# Patient Record
Sex: Female | Born: 1965 | Race: White | Hispanic: No | Marital: Single | Smoking: Current every day smoker
Health system: Southern US, Community
[De-identification: ages and names within clinical notes are randomized; demographics above are authoritative.]

## PROBLEM LIST (undated history)

## (undated) DIAGNOSIS — I700 Atherosclerosis of aorta: Secondary | ICD-10-CM

## (undated) DIAGNOSIS — I471 Supraventricular tachycardia, unspecified: Secondary | ICD-10-CM

## (undated) DIAGNOSIS — Z9289 Personal history of other medical treatment: Secondary | ICD-10-CM

## (undated) DIAGNOSIS — K219 Gastro-esophageal reflux disease without esophagitis: Secondary | ICD-10-CM

## (undated) DIAGNOSIS — R079 Chest pain, unspecified: Secondary | ICD-10-CM

## (undated) DIAGNOSIS — J449 Chronic obstructive pulmonary disease, unspecified: Secondary | ICD-10-CM

## (undated) DIAGNOSIS — R0600 Dyspnea, unspecified: Secondary | ICD-10-CM

## (undated) DIAGNOSIS — T8859XA Other complications of anesthesia, initial encounter: Secondary | ICD-10-CM

## (undated) DIAGNOSIS — I10 Essential (primary) hypertension: Secondary | ICD-10-CM

## (undated) DIAGNOSIS — D649 Anemia, unspecified: Secondary | ICD-10-CM

## (undated) DIAGNOSIS — Z9889 Other specified postprocedural states: Secondary | ICD-10-CM

## (undated) DIAGNOSIS — F32A Depression, unspecified: Secondary | ICD-10-CM

## (undated) DIAGNOSIS — R112 Nausea with vomiting, unspecified: Secondary | ICD-10-CM

## (undated) DIAGNOSIS — E785 Hyperlipidemia, unspecified: Secondary | ICD-10-CM

## (undated) DIAGNOSIS — Z720 Tobacco use: Secondary | ICD-10-CM

## (undated) DIAGNOSIS — F419 Anxiety disorder, unspecified: Secondary | ICD-10-CM

## (undated) HISTORY — DX: Personal history of other medical treatment: Z92.89

## (undated) HISTORY — DX: Essential (primary) hypertension: I10

## (undated) HISTORY — DX: Hyperlipidemia, unspecified: E78.5

## (undated) HISTORY — PX: MOUTH SURGERY: SHX715

## (undated) HISTORY — DX: Supraventricular tachycardia, unspecified: I47.10

## (undated) HISTORY — DX: Supraventricular tachycardia: I47.1

## (undated) HISTORY — DX: Depression, unspecified: F32.A

## (undated) HISTORY — DX: Chest pain, unspecified: R07.9

## (undated) HISTORY — DX: Atherosclerosis of aorta: I70.0

## (undated) HISTORY — DX: Anxiety disorder, unspecified: F41.9

## (undated) HISTORY — DX: Chronic obstructive pulmonary disease, unspecified: J44.9

## (undated) HISTORY — DX: Tobacco use: Z72.0

## (undated) HISTORY — PX: CHOLECYSTECTOMY: SHX55

---

## 2006-05-16 ENCOUNTER — Emergency Department: Payer: Self-pay | Admitting: Emergency Medicine

## 2008-04-17 ENCOUNTER — Emergency Department: Payer: Self-pay | Admitting: Emergency Medicine

## 2009-01-07 ENCOUNTER — Emergency Department: Payer: Self-pay | Admitting: Emergency Medicine

## 2012-03-16 ENCOUNTER — Emergency Department: Payer: Self-pay

## 2013-05-01 DIAGNOSIS — R5383 Other fatigue: Secondary | ICD-10-CM | POA: Insufficient documentation

## 2013-05-01 DIAGNOSIS — G4700 Insomnia, unspecified: Secondary | ICD-10-CM | POA: Insufficient documentation

## 2013-05-11 ENCOUNTER — Ambulatory Visit: Payer: Self-pay

## 2013-07-27 DIAGNOSIS — J069 Acute upper respiratory infection, unspecified: Secondary | ICD-10-CM | POA: Insufficient documentation

## 2013-07-27 DIAGNOSIS — F419 Anxiety disorder, unspecified: Secondary | ICD-10-CM | POA: Insufficient documentation

## 2013-07-27 DIAGNOSIS — F32A Depression, unspecified: Secondary | ICD-10-CM | POA: Insufficient documentation

## 2013-07-31 DIAGNOSIS — R928 Other abnormal and inconclusive findings on diagnostic imaging of breast: Secondary | ICD-10-CM | POA: Insufficient documentation

## 2014-09-27 ENCOUNTER — Encounter: Payer: Self-pay | Admitting: Emergency Medicine

## 2014-09-27 ENCOUNTER — Emergency Department
Admission: EM | Admit: 2014-09-27 | Discharge: 2014-09-27 | Disposition: A | Discharge status: Home / Self Care | Payer: Medicaid Other | Attending: Emergency Medicine | Admitting: Emergency Medicine

## 2014-09-27 DIAGNOSIS — Z3202 Encounter for pregnancy test, result negative: Secondary | ICD-10-CM | POA: Diagnosis not present

## 2014-09-27 DIAGNOSIS — R000 Tachycardia, unspecified: Secondary | ICD-10-CM | POA: Diagnosis present

## 2014-09-27 DIAGNOSIS — Z720 Tobacco use: Secondary | ICD-10-CM | POA: Insufficient documentation

## 2014-09-27 DIAGNOSIS — I471 Supraventricular tachycardia: Secondary | ICD-10-CM | POA: Diagnosis not present

## 2014-09-27 HISTORY — DX: Supraventricular tachycardia: I47.1

## 2014-09-27 LAB — URINALYSIS COMPLETE WITH MICROSCOPIC (ARMC ONLY)
Bilirubin Urine: NEGATIVE
Glucose, UA: NEGATIVE mg/dL
Hgb urine dipstick: NEGATIVE
Ketones, ur: NEGATIVE mg/dL
Leukocytes, UA: NEGATIVE
Nitrite: NEGATIVE
PH: 6 (ref 5.0–8.0)
PROTEIN: NEGATIVE mg/dL
Specific Gravity, Urine: 1.008 (ref 1.005–1.030)

## 2014-09-27 LAB — TROPONIN I

## 2014-09-27 LAB — URINE DRUG SCREEN, QUALITATIVE (ARMC ONLY)
Amphetamines, Ur Screen: NOT DETECTED
BARBITURATES, UR SCREEN: NOT DETECTED
BENZODIAZEPINE, UR SCRN: NOT DETECTED
Cannabinoid 50 Ng, Ur ~~LOC~~: NOT DETECTED
Cocaine Metabolite,Ur ~~LOC~~: NOT DETECTED
MDMA (Ecstasy)Ur Screen: NOT DETECTED
METHADONE SCREEN, URINE: NOT DETECTED
Opiate, Ur Screen: NOT DETECTED
Phencyclidine (PCP) Ur S: NOT DETECTED
TRICYCLIC, UR SCREEN: NOT DETECTED

## 2014-09-27 LAB — CBC WITH DIFFERENTIAL/PLATELET
BASOS ABS: 0.1 10*3/uL (ref 0–0.1)
Basophils Relative: 1 %
EOS ABS: 0.1 10*3/uL (ref 0–0.7)
EOS PCT: 1 %
HCT: 41.4 % (ref 35.0–47.0)
Hemoglobin: 14.4 g/dL (ref 12.0–16.0)
LYMPHS PCT: 20 %
Lymphs Abs: 1.9 10*3/uL (ref 1.0–3.6)
MCH: 31.3 pg (ref 26.0–34.0)
MCHC: 34.8 g/dL (ref 32.0–36.0)
MCV: 89.9 fL (ref 80.0–100.0)
MONO ABS: 0.6 10*3/uL (ref 0.2–0.9)
Monocytes Relative: 6 %
Neutro Abs: 6.9 10*3/uL — ABNORMAL HIGH (ref 1.4–6.5)
Neutrophils Relative %: 72 %
PLATELETS: 203 10*3/uL (ref 150–440)
RBC: 4.6 MIL/uL (ref 3.80–5.20)
RDW: 12.3 % (ref 11.5–14.5)
WBC: 9.5 10*3/uL (ref 3.6–11.0)

## 2014-09-27 LAB — BASIC METABOLIC PANEL
Anion gap: 8 (ref 5–15)
BUN: 14 mg/dL (ref 6–20)
CALCIUM: 9.2 mg/dL (ref 8.9–10.3)
CO2: 23 mmol/L (ref 22–32)
CREATININE: 0.77 mg/dL (ref 0.44–1.00)
Chloride: 108 mmol/L (ref 101–111)
GFR calc Af Amer: >60 mL/min (ref >60)
GLUCOSE: 105 mg/dL — AB (ref 65–99)
POTASSIUM: 3.8 mmol/L (ref 3.5–5.1)
SODIUM: 139 mmol/L (ref 135–145)

## 2014-09-27 LAB — PREGNANCY, URINE: Preg Test, Ur: NEGATIVE

## 2014-09-27 LAB — PHOSPHORUS: Phosphorus: 3.7 mg/dL (ref 2.5–4.6)

## 2014-09-27 LAB — MAGNESIUM: MAGNESIUM: 1.9 mg/dL (ref 1.7–2.4)

## 2014-09-27 MED ORDER — LORAZEPAM 0.5 MG PO TABS
ORAL_TABLET | ORAL | Status: AC
  Administered 2014-09-27: 0.5 mg via ORAL
  Filled 2014-09-27: qty 1

## 2014-09-27 MED ORDER — LORAZEPAM 0.5 MG PO TABS
0.5000 mg | ORAL_TABLET | Freq: Once | ORAL | Status: AC
  Administered 2014-09-27: 0.5 mg via ORAL

## 2014-09-27 NOTE — Discharge Instructions (Signed)
Supraventricular Tachycardia °Supraventricular tachycardia (SVT) is an abnormal heart rhythm (arrhythmia) that causes the heart to beat very fast (tachycardia). This kind of fast heartbeat originates in the upper chambers of the heart (atria). SVT can cause the heart to beat greater than 100 beats per minute. SVT can have a rapid burst of heartbeats. This can start and stop suddenly without warning and is called nonsustained. SVT can also be sustained, in which the heart beats at a continuous fast rate.  °CAUSES  °There can be different causes of SVT. Some of these include: °· Heart valve problems such as mitral valve prolapse. °· An enlarged heart (hypertrophic cardiomyopathy). °· Congenital heart problems. °· Heart inflammation (pericarditis). °· Hyperthyroidism. °· Low potassium or magnesium levels. °· Caffeine. °· Drug use such as cocaine, methamphetamines, or stimulants. °· Some over-the-counter medicines such as: °¨ Decongestants. °¨ Diet medicines. °¨ Herbal medicines. °SYMPTOMS  °Symptoms of SVT can vary. Symptoms depend on whether the SVT is sustained or nonsustained. You may experience: °· No symptoms (asymptomatic). °· An awareness of your heart beating rapidly (palpitations). °· Shortness of breath. °· Chest pain or pressure. °If your blood pressure drops because of the SVT, you may experience: °· Fainting or near fainting. °· Weakness. °· Dizziness. °DIAGNOSIS  °Different tests can be performed to diagnose SVT, such as: °· An electrocardiogram (EKG). This is a painless test that records the electrical activity of your heart. °· Holter monitor. This is a 24 hour recording of your heart rhythm. You will be given a diary. Write down all symptoms that you have and what you were doing at the time you experienced symptoms. °· Arrhythmia monitor. This is a small device that your wear for several weeks. It records the heart rhythm when you have symptoms. °· Echocardiogram. This is an imaging test to help detect  abnormal heart structure such as congenital abnormalities, heart valve problems, or heart enlargement. °· Stress test. This test can help determine if the SVT is related to exercise. °· Electrophysiology study (EPS). This is a procedure that evaluates your heart's electrical system and can help your caregiver find the cause of your SVT. °TREATMENT  °Treatment of SVT depends on the symptoms, how often it recurs, and whether there are any underlying heart problems.  °· If symptoms are rare and no other cardiac disease is present, no treatment may be needed. °· Blood work may be done to check potassium, magnesium, and thyroid hormone levels to see if they are abnormal. If these levels are abnormal, treatment to correct the problems will occur. °Medicines °Your caregiver may use oral medicines to treat SVT. These medicines are given for long-term control of SVT. Medicines may be used alone or in combination with other treatments. These medicines work to slow nerve impulses in the heart muscle. These medicines can also be used to treat high blood pressure. Some of these medicines may include: °· Calcium channel blockers. °· Beta blockers. °· Digoxin. °Nonsurgical procedures °Nonsurgical techniques may be used if oral medicines do not work. Some examples include: °· Cardioversion. This technique uses either drugs or an electrical shock to restore a normal heart rhythm. °¨ Cardioversion drugs may be given through an intravenous (IV) line to help "reset" the heart rhythm. °¨ In electrical cardioversion, the caregiver shocks your heart to stop its beat for a split second. This helps to reset the heart to a normal rhythm. °· Ablation. This procedure is done under mild sedation. High frequency radio wave energy is used to   destroy the area of heart tissue responsible for the SVT. °HOME CARE INSTRUCTIONS  °· Do not smoke. °· Only take medicines prescribed by your caregiver. Check with your caregiver before using over-the-counter  medicines. °· Check with your caregiver about how much alcohol and caffeine (coffee, tea, colas, or chocolate) you may have. °· It is very important to keep all follow-up referrals and appointments in order to properly manage this problem. °SEEK IMMEDIATE MEDICAL CARE IF: °· You have dizziness. °· You faint or nearly faint. °· You have shortness of breath. °· You have chest pain or pressure. °· You have sudden nausea or vomiting. °· You have profuse sweating. °· You are concerned about how long your symptoms last. °· You are concerned about the frequency of your SVT episodes. °If you have the above symptoms, call your local emergency services (911 in [place] immediately. Do not drive yourself to the hospital. °MAKE SURE YOU:  °· Understand these instructions. °· Will watch your condition. °· Will get help right away if you are not doing well or get worse. °Document Released: 01/05/2005 Document Revised: 03/30/2011 Document Reviewed: 04/19/2008 °Exit[employer]® Patient Information ©2015 [employer]. This information is not intended to replace advice given to you by your health care provider. Make sure you discuss any questions you have with your health care provider. ° °

## 2014-09-27 NOTE — ED Notes (Signed)
Ems pt from work , SVT of a rate of HR 228, converted while ems attempted to start IV , pt currently as Normal vitals , complaining of a chest tightness , and dizziness.

## 2014-09-27 NOTE — ED Provider Notes (Signed)
[facility] Emergency Department Provider Note  ____________________________________________  Time seen: 9:00 AM  I have reviewed the triage vital signs and the nursing notes.   HISTORY  Chief Complaint Tachycardia    [name] is a 49 y.o. female with a history of SVT in the past who arrives by EMS from work due to palpitations and rapid heartbeat. EMS report that her heart rate was greater than 220, and when they were starting an IV the patient spontaneously converted to a sinus rhythm on their monitor with a normalized heart rate. The patient reports that she had no symptoms prior to this happening and it just happened out of the blue, and afterward she does have some very mild central chest tightness and anxiety. Denies any shortness of breath. No recent travel trauma hospitalization or surgery. No cocaine or other drug use. No stimulant use. She does report a remote history of cocaine use  Drinks one cup of coffee every morning. Smokes one and a half packs per day which is her usual. No exertional symptoms. No fevers chills or recent illness.     Past Medical History  Diagnosis Date  . SVT (supraventricular tachycardia)      There are no active problems to display for this patient.    Past Surgical History  Procedure Laterality Date  . Cesarean section       No current outpatient prescriptions on file. Ativan, not filled due to expense  Allergies Codeine   No family history on file.  Social History Social History  Substance Use Topics  . Smoking status: Current Every Day Smoker  . Smokeless tobacco: None  . Alcohol Use: None    Review of Systems  Constitutional:   No fever or chills. No weight changes Eyes:   No blurry vision or double vision.  ENT:   No sore throat. Cardiovascular:   Mild chest tightness Respiratory:   No dyspnea or cough. Gastrointestinal:   Negative for abdominal pain, vomiting and diarrhea.  No  BRBPR or melena. Genitourinary:   Negative for dysuria, urinary retention, bloody urine, or difficulty urinating. Musculoskeletal:   Negative for back pain. No joint swelling or pain. Skin:   Negative for rash. Neurological:   Negative for headaches, focal weakness or numbness. Psychiatric:  No anxiety or depression.   Endocrine:  No hot/cold intolerance, changes in energy, or sleep difficulty.  10-point ROS otherwise negative.  ____________________________________________   PHYSICAL EXAM:  VITAL SIGNS: ED Triage Vitals  Enc Vitals Group     BP 09/27/14 0906 130/88 mmHg     Pulse Rate 09/27/14 0905 98     Resp 09/27/14 0905 20     Temp 09/27/14 0911 98.1 F (36.7 C)     Temp src --      SpO2 09/27/14 0906 98 %     Weight 09/27/14 0905 169 lb (76.658 kg)     Height 09/27/14 0905  (1.575 m)     Head Cir --      Peak Flow --      Pain Score 09/27/14 0905 2     Pain Loc --      Pain Edu? --      Excl. in GC? --      Constitutional:   Alert and oriented. Well appearing and in no distress. Calm and comfortable Eyes:   No scleral icterus. No conjunctival pallor. PERRL. EOMI ENT   Head:   Normocephalic and atraumatic.  Nose:   No congestion/rhinnorhea. No septal hematoma   Mouth/Throat:   MMM, no pharyngeal erythema. No peritonsillar mass. No uvula shift.   Neck:   No stridor. No SubQ emphysema. No meningismus. Hematological/Lymphatic/Immunilogical:   No cervical lymphadenopathy. Cardiovascular:   RRR. Normal and symmetric distal pulses are present in all extremities. No murmurs, rubs, or gallops. Respiratory:   Normal respiratory effort without tachypnea nor retractions. Breath sounds are clear and equal bilaterally. No wheezes/rales/rhonchi. Gastrointestinal:   Soft and nontender. No distention. There is no CVA tenderness.  No rebound, rigidity, or guarding. Genitourinary:   deferred Musculoskeletal:   Nontender with normal range of motion in all  extremities. No joint effusions.  No lower extremity tenderness.  No edema. Neurologic:   Normal speech and language.  CN 2-10 normal. Motor grossly intact. No pronator drift.  Normal gait. No gross focal neurologic deficits are appreciated.  Skin:    Skin is warm, dry and intact. No rash noted.  No petechiae, purpura, or bullae. Psychiatric:   Mood and affect are normal. Speech and behavior are normal. Patient exhibits appropriate insight and judgment.  ____________________________________________    LABS (pertinent positives/negatives) (all labs ordered are listed, but only abnormal results are displayed) Labs Reviewed  BASIC METABOLIC PANEL - Abnormal; Notable for the following:    Glucose, Bld 105 (*)    All other components within normal limits  CBC WITH DIFFERENTIAL/PLATELET - Abnormal; Notable for the following:    Neutro Abs 6.9 (*)    All other components within normal limits  URINALYSIS COMPLETEWITH MICROSCOPIC ([facility] ONLY) - Abnormal; Notable for the following:    Color, Urine YELLOW (*)    APPearance CLEAR (*)    Bacteria, UA RARE (*)    Squamous Epithelial / LPF 0-5 (*)    All other components within normal limits  MAGNESIUM  PHOSPHORUS  PREGNANCY, URINE  URINE DRUG SCREEN, QUALITATIVE ([facility] ONLY)  TROPONIN I   ____________________________________________   EKG  Interpreted by me Sinus rhythm rate of 94, normal axis intervals QRS ST segments and T waves.  ____________________________________________    RADIOLOGY    ____________________________________________   PROCEDURES   ____________________________________________   INITIAL IMPRESSION / ASSESSMENT AND PLAN / ED COURSE  Pertinent labs & imaging results that were available during my care of the patient were reviewed by me and considered in my medical decision making (see chart for details).  Patient presents with recurrent episode of SVT that is spontaneously resolved. We'll check labs,  and if unremarkable patient is stable for discharge home. Does not need rate control agents at this time. Low suspicion for ACS PE TAD pneumothorax carditis mediastinitis pneumonia or sepsis.     ____________________________________________   FINAL CLINICAL IMPRESSION(S) / ED DIAGNOSES  Final diagnoses:  SVT (supraventricular tachycardia)      Sharman Cheek, MD 09/27/14 1052

## 2014-11-08 ENCOUNTER — Ambulatory Visit: Payer: Medicaid Other | Admitting: Cardiovascular Disease

## 2014-11-12 ENCOUNTER — Ambulatory Visit: Payer: Medicaid Other | Admitting: Cardiovascular Disease

## 2015-01-10 ENCOUNTER — Ambulatory Visit: Payer: Medicaid Other | Admitting: Cardiovascular Disease

## 2015-01-10 ENCOUNTER — Encounter: Payer: Self-pay | Admitting: *Deleted

## 2015-01-10 DIAGNOSIS — R0989 Other specified symptoms and signs involving the circulatory and respiratory systems: Secondary | ICD-10-CM

## 2015-02-01 ENCOUNTER — Ambulatory Visit: Payer: Medicaid Other | Admitting: Cardiovascular Disease

## 2015-02-10 ENCOUNTER — Emergency Department
Admission: EM | Admit: 2015-02-10 | Discharge: 2015-02-10 | Disposition: A | Discharge status: Home / Self Care | Payer: Medicaid Other | Attending: Student | Admitting: Student

## 2015-02-10 ENCOUNTER — Encounter: Payer: Self-pay | Admitting: Emergency Medicine

## 2015-02-10 ENCOUNTER — Emergency Department: Payer: Medicaid Other

## 2015-02-10 DIAGNOSIS — I471 Supraventricular tachycardia: Secondary | ICD-10-CM | POA: Diagnosis not present

## 2015-02-10 DIAGNOSIS — Z79899 Other long term (current) drug therapy: Secondary | ICD-10-CM | POA: Insufficient documentation

## 2015-02-10 DIAGNOSIS — F17200 Nicotine dependence, unspecified, uncomplicated: Secondary | ICD-10-CM | POA: Insufficient documentation

## 2015-02-10 DIAGNOSIS — R000 Tachycardia, unspecified: Secondary | ICD-10-CM | POA: Diagnosis present

## 2015-02-10 LAB — CBC WITH DIFFERENTIAL/PLATELET
BASOS ABS: 0 10*3/uL (ref 0–0.1)
BASOS PCT: 1 %
Eosinophils Absolute: 0.1 10*3/uL (ref 0–0.7)
Eosinophils Relative: 2 %
HEMATOCRIT: 40.8 % (ref 35.0–47.0)
HEMOGLOBIN: 14.2 g/dL (ref 12.0–16.0)
LYMPHS PCT: 26 %
Lymphs Abs: 1.9 10*3/uL (ref 1.0–3.6)
MCH: 31.2 pg (ref 26.0–34.0)
MCHC: 34.7 g/dL (ref 32.0–36.0)
MCV: 89.8 fL (ref 80.0–100.0)
MONOS PCT: 8 %
Monocytes Absolute: 0.6 10*3/uL (ref 0.2–0.9)
NEUTROS ABS: 4.6 10*3/uL (ref 1.4–6.5)
NEUTROS PCT: 63 %
Platelets: 213 10*3/uL (ref 150–440)
RBC: 4.54 MIL/uL (ref 3.80–5.20)
RDW: 12.5 % (ref 11.5–14.5)
WBC: 7.3 10*3/uL (ref 3.6–11.0)

## 2015-02-10 LAB — COMPREHENSIVE METABOLIC PANEL
ALT: 14 U/L (ref 14–54)
AST: 14 U/L — ABNORMAL LOW (ref 15–41)
Albumin: 3.8 g/dL (ref 3.5–5.0)
Alkaline Phosphatase: 51 U/L (ref 38–126)
Anion gap: 6 (ref 5–15)
BUN: 11 mg/dL (ref 6–20)
CO2: 26 mmol/L (ref 22–32)
Calcium: 8.8 mg/dL — ABNORMAL LOW (ref 8.9–10.3)
Chloride: 110 mmol/L (ref 101–111)
Creatinine, Ser: 0.66 mg/dL (ref 0.44–1.00)
GFR calc Af Amer: >60 mL/min (ref >60)
GFR calc non Af Amer: >60 mL/min (ref >60)
Glucose, Bld: 107 mg/dL — ABNORMAL HIGH (ref 65–99)
Potassium: 3.8 mmol/L (ref 3.5–5.1)
Sodium: 142 mmol/L (ref 135–145)
Total Bilirubin: 0.6 mg/dL (ref 0.3–1.2)
Total Protein: 8.1 g/dL (ref 6.5–8.1)

## 2015-02-10 LAB — TSH: TSH: 2.152 u[IU]/mL (ref 0.350–4.500)

## 2015-02-10 LAB — TROPONIN I: Troponin I: <0.03 ng/mL (ref <0.031)

## 2015-02-10 NOTE — ED Notes (Signed)
Patient brought in by acems from home for c/o SVT, patient states that she got up to go to the bathroom and started to feel dizzy and felt like her heart was racing. When EMS arrived patients rate was 180-200 and she was found to be in SVT on monitor. Patient was given 6 mg of adenosine and converted to sinus tach. Patient has had 2 episodes of SVT in the past.

## 2015-02-10 NOTE — ED Provider Notes (Signed)
[facility] Emergency Department Provider Note  ____________________________________________  Time seen: Approximately 12:04 PM  I have reviewed the triage vital signs and the nursing notes.   HISTORY  Chief Complaint Tachycardia    [name] is a 50 y.o. female with history of SVT and anxiety who presents for lightheadedness in the setting of SVT, sudden onset, initially severe, now resolved after EMS gave her adenosine, no modifying factors. The patient reports that this is the third time that she has had a run of SVT. Usually SVT terminates with vagal maneuvers but today, unresponsive to vagal maneuvers. She was having some mild chest discomfort when she had this lightheadedness but this has resolved. No shortness of breath. She denies palpitations. No vomiting, diarrhea, fevers or chills. Really she feels well.   Past Medical History  Diagnosis Date  . SVT (supraventricular tachycardia) (HCC)     There are no active problems to display for this patient.   Past Surgical History  Procedure Laterality Date  . Cesarean section      Current Outpatient Rx  Name  Route  Sig  Dispense  Refill  . LORazepam (ATIVAN) 0.5 MG tablet   Oral   Take 0.5 mg by mouth every 8 (eight) hours as needed for anxiety.          Marland Kitchen buPROPion (WELLBUTRIN XL) 300 MG 24 hr tablet   Oral   Take 300 mg by mouth daily. Reported on 02/10/2015           Allergies Codeine  No family history on file.  Social History Social History  Substance Use Topics  . Smoking status: Current Every Day Smoker  . Smokeless tobacco: None  . Alcohol Use: Yes    Review of Systems Constitutional: No fever/chills Eyes: No visual changes. ENT: No sore throat. Cardiovascular: + chest pain. Respiratory: Denies shortness of breath. Gastrointestinal: No abdominal pain.  No nausea, no vomiting.  No diarrhea.  No constipation. Genitourinary: Negative for  dysuria. Musculoskeletal: Negative for back pain. Skin: Negative for rash. Neurological: Negative for headaches, focal weakness or numbness.  10-point ROS otherwise negative.  ____________________________________________   PHYSICAL EXAM:  VITAL SIGNS: ED Triage Vitals  Enc Vitals Group     BP 02/10/15 1124 115/82 mmHg     Pulse Rate 02/10/15 1124 89     Resp 02/10/15 1124 14     Temp 02/10/15 1124 98.1 F (36.7 C)     Temp Source 02/10/15 1124 Oral     SpO2 02/10/15 1124 96 %     Weight 02/10/15 1124 183 lb (83.008 kg)     Height 02/10/15 1124  (1.575 m)     Head Cir --      Peak Flow --      Pain Score 02/10/15 1125 0     Pain Loc --      Pain Edu? --      Excl. in GC? --     Constitutional: Alert and oriented. Well appearing and in no acute distress. Eyes: Conjunctivae are normal. PERRL. EOMI.  Head: Atraumatic. Nose: No congestion/rhinnorhea. Mouth/Throat: Mucous membranes are moist.  Oropharynx non-erythematous. Neck: No stridor.   Cardiovascular: Normal rate, regular rhythm. Grossly normal heart sounds.  Good peripheral circulation. Respiratory: Normal respiratory effort.  No retractions. Lungs CTAB. Gastrointestinal: Soft and nontender. No distention. No CVA tenderness. Genitourinary: deferred Musculoskeletal: No lower extremity tenderness nor edema.  No joint effusions. Neurologic:  Normal speech and language. No  gross focal neurologic deficits are appreciated. No gait instability. Skin:  Skin is warm, dry and intact. No rash noted. Psychiatric: Mood and affect are normal. Speech and behavior are normal.  ____________________________________________   LABS (all labs ordered are listed, but only abnormal results are displayed)  Labs Reviewed  COMPREHENSIVE METABOLIC PANEL - Abnormal; Notable for the following:    Glucose, Bld 107 (*)    Calcium 8.8 (*)    AST 14 (*)    All other components within normal limits  TSH  CBC WITH DIFFERENTIAL/PLATELET   TROPONIN I  T4   ____________________________________________  EKG  ED ECG REPORT I, Gayla Doss, the attending physician, personally viewed and interpreted this ECG.   Date: 02/10/2015  EKG Time: 11:18  Rate: 92  Rhythm: normal EKG, normal sinus rhythm  Axis: normal  Intervals:none  ST&T Change: No acute ST elevation.  ____________________________________________  RADIOLOGY  CXR IMPRESSION: No acute cardiopulmonary abnormality.  Mild increased interstitial markings suspected due to smoking.   ____________________________________________   PROCEDURES  Procedure(s) performed: None  Critical Care performed: No  ____________________________________________   INITIAL IMPRESSION / ASSESSMENT AND PLAN / ED COURSE  Pertinent labs & imaging results that were available during my care of the patient were reviewed by me and considered in my medical decision making (see chart for details).  [name] is a 50 y.o. female with history of SVT and anxiety who presents for lightheadedness in the setting of SVT with rate of 180-200 which converted to sinus tachycardia after 6 mg of adenosine was given by EMS. On exam, she is very well-appearing in no acute distress. Vital signs stable, she is afebrile. Currently chest pain-free. EKG shows sinus rhythm. Plan for screening labs, brief observation in the emergency department as well as chest x-ray.   ----------------------------------------- 2:22 PM on 02/10/2015 ----------------------------------------- Patient observed for 3 hours in the emergency department without any recurrence of SVT. Labs reviewed. Normal CBC, CMP and troponin. Normal TSH. CXR shows no acute abnormality. Patient continues to feel well. We discussed return precautions, need for close follow-up with Dr. Kirke Corin and she is comfortable with the discharge plan. DC home.  ____________________________________________   FINAL CLINICAL IMPRESSION(S) / ED  DIAGNOSES  Final diagnoses:  SVT (supraventricular tachycardia) (HCC)      Gayla Doss, MD 02/10/15 1425

## 2015-02-20 ENCOUNTER — Emergency Department
Admission: EM | Admit: 2015-02-20 | Discharge: 2015-02-20 | Disposition: A | Discharge status: Home / Self Care | Payer: Medicaid Other | Attending: Emergency Medicine | Admitting: Emergency Medicine

## 2015-02-20 DIAGNOSIS — F17200 Nicotine dependence, unspecified, uncomplicated: Secondary | ICD-10-CM | POA: Diagnosis not present

## 2015-02-20 DIAGNOSIS — Z79899 Other long term (current) drug therapy: Secondary | ICD-10-CM | POA: Diagnosis not present

## 2015-02-20 DIAGNOSIS — I471 Supraventricular tachycardia: Secondary | ICD-10-CM | POA: Diagnosis not present

## 2015-02-20 DIAGNOSIS — R42 Dizziness and giddiness: Secondary | ICD-10-CM | POA: Diagnosis present

## 2015-02-20 LAB — TROPONIN I: Troponin I: 0.03 ng/mL (ref <0.031)

## 2015-02-20 LAB — CBC WITH DIFFERENTIAL/PLATELET
BASOS ABS: 0 10*3/uL (ref 0–0.1)
Basophils Relative: 1 %
EOS PCT: 1 %
Eosinophils Absolute: 0.1 10*3/uL (ref 0–0.7)
HCT: 39.9 % (ref 35.0–47.0)
Hemoglobin: 14 g/dL (ref 12.0–16.0)
Lymphocytes Relative: 26 %
Lymphs Abs: 2.1 10*3/uL (ref 1.0–3.6)
MCH: 30.8 pg (ref 26.0–34.0)
MCHC: 35 g/dL (ref 32.0–36.0)
MCV: 88 fL (ref 80.0–100.0)
MONO ABS: 0.5 10*3/uL (ref 0.2–0.9)
MONOS PCT: 7 %
Neutro Abs: 5.2 10*3/uL (ref 1.4–6.5)
Neutrophils Relative %: 65 %
PLATELETS: 205 10*3/uL (ref 150–440)
RBC: 4.54 MIL/uL (ref 3.80–5.20)
RDW: 12.8 % (ref 11.5–14.5)
WBC: 8 10*3/uL (ref 3.6–11.0)

## 2015-02-20 LAB — BASIC METABOLIC PANEL
ANION GAP: 6 (ref 5–15)
BUN: 14 mg/dL (ref 6–20)
CALCIUM: 9.2 mg/dL (ref 8.9–10.3)
CO2: 24 mmol/L (ref 22–32)
CREATININE: 0.81 mg/dL (ref 0.44–1.00)
Chloride: 110 mmol/L (ref 101–111)
GFR calc Af Amer: >60 mL/min (ref >60)
GLUCOSE: 166 mg/dL — AB (ref 65–99)
Potassium: 3.7 mmol/L (ref 3.5–5.1)
Sodium: 140 mmol/L (ref 135–145)

## 2015-02-20 NOTE — ED Provider Notes (Signed)
[facility] Emergency Department Provider Note   ____________________________________________  Time seen: ~1545  I have reviewed the triage vital signs and the nursing notes.   HISTORY  Chief Complaint Tachycardia   History limited by: Not Limited   [name] is a 50 y.o. female with history of SVT, who presents to the emergency department today after an episode of SVT. The patient states that it started suddenly this afternoon. It did break on its own a couple minutes after the patient tried a vagal maneuver. The patient states that she had associated dizziness. She additionally had some mild chest discomfort. She states that she gets symptoms like these with previous episodes of SVT. She was seen in the hospital a couple of weeks ago for SVT. The patient states she did have half a cup of coffee today as well as some soda which is unusual for her. She denies any other over-the-counter medications. The patient states that otherwise she has been feeling well recently except for some generalized body pain last night.     Past Medical History  Diagnosis Date  . SVT (supraventricular tachycardia) (HCC)     There are no active problems to display for this patient.   Past Surgical History  Procedure Laterality Date  . Cesarean section      Current Outpatient Rx  Name  Route  Sig  Dispense  Refill  . buPROPion (WELLBUTRIN XL) 300 MG 24 hr tablet   Oral   Take 300 mg by mouth daily. Reported on 02/10/2015         . LORazepam (ATIVAN) 0.5 MG tablet   Oral   Take 0.5 mg by mouth every 8 (eight) hours as needed for anxiety.            Allergies Codeine  No family history on file.  Social History Social History  Substance Use Topics  . Smoking status: Current Every Day Smoker  . Smokeless tobacco: None  . Alcohol Use: Yes    Review of Systems  Constitutional: Negative for fever. Cardiovascular: Positive for chest  pain. Respiratory: Negative for shortness of breath. Gastrointestinal: Negative for abdominal pain, vomiting and diarrhea. Neurological: Negative for headaches, focal weakness or numbness.   10-point ROS otherwise negative.  ____________________________________________   PHYSICAL EXAM:  VITAL SIGNS: ED Triage Vitals  Enc Vitals Group     BP 02/20/15 1523 96/71 mmHg     Pulse Rate 02/20/15 1523 96     Resp 02/20/15 1523 18     Temp 02/20/15 1523 98.1 F (36.7 C)     Temp Source 02/20/15 1523 Oral     SpO2 02/20/15 1523 97 %     Weight 02/20/15 1524 183 lb (83.008 kg)     Height 02/20/15 1524  (1.575 m)     Head Cir --      Peak Flow --      Pain Score 02/20/15 1525 0   Constitutional: Alert and oriented. Well appearing and in no distress. Eyes: Conjunctivae are normal. PERRL. Normal extraocular movements. ENT   Head: Normocephalic and atraumatic.   Nose: No congestion/rhinnorhea.   Mouth/Throat: Mucous membranes are moist.   Neck: No stridor. Hematological/Lymphatic/Immunilogical: No cervical lymphadenopathy. Cardiovascular: Normal rate, regular rhythm.  No murmurs, rubs, or gallops. Respiratory: Normal respiratory effort without tachypnea nor retractions. Breath sounds are clear and equal bilaterally. No wheezes/rales/rhonchi. Gastrointestinal: Soft and nontender. No distention.  Genitourinary: Deferred Musculoskeletal: Normal range of motion in all extremities.  No joint effusions.  No lower extremity tenderness nor edema. Neurologic:  Normal speech and language. No gross focal neurologic deficits are appreciated.  Skin:  Skin is warm, dry and intact. No rash noted. Psychiatric: Mood and affect are normal. Speech and behavior are normal. Patient exhibits appropriate insight and judgment.  ____________________________________________    LABS (pertinent positives/negatives)  Labs Reviewed  BASIC METABOLIC PANEL - Abnormal; Notable for the following:     Glucose, Bld 166 (*)    All other components within normal limits  CBC WITH DIFFERENTIAL/PLATELET  TROPONIN I     ____________________________________________   EKG  I, Phineas Semen, attending physician, personally viewed and interpreted this EKG  EKG Time: 1559 Rate: 83 Rhythm: normal sinus rhythm Axis: normal Intervals: qtc 438 QRS: narrow ST changes: no st elevation Impression: normal ekg   ____________________________________________    RADIOLOGY  None   ____________________________________________   PROCEDURES  Procedure(s) performed: None  Critical Care performed: No  ____________________________________________   INITIAL IMPRESSION / ASSESSMENT AND PLAN / ED COURSE  Pertinent labs & imaging results that were available during my care of the patient were reviewed by me and considered in my medical decision making (see chart for details).  Patient presented to the emergency department today because of concerns for another episode of SVT. It did resolve without requirement for medication. Patient was observed here in the emergency department for a number of hours without any return of her SVT. Patient's blood work without any concerning symptoms. Patient does state that she had caffeine today which might of triggered the episodes of SVT. I discussed this with the patient.  ____________________________________________   FINAL CLINICAL IMPRESSION(S) / ED DIAGNOSES  Final diagnoses:  SVT (supraventricular tachycardia) (HCC)     Phineas Semen, MD 02/20/15 2155

## 2015-02-20 NOTE — ED Notes (Signed)
Pt from home via EMS, reports heart racing, pt in SVT upon EMS arrival. [facility] herself to NSR. Reports lightheadedness. Was here for same 2 weeks ago and needed adenosine to covert

## 2015-02-20 NOTE — ED Notes (Signed)
Pt. Going home with family. 

## 2015-02-20 NOTE — Discharge Instructions (Signed)
Please seek medical attention for any high fevers, chest pain, shortness of breath, change in behavior, persistent vomiting, bloody stool or any other new or concerning symptoms. ° ° °Paroxysmal Supraventricular Tachycardia °Paroxysmal supraventricular tachycardia (PSVT) is when your heart beats very quickly and then suddenly stops beating so quickly. You may or may not have any symptoms when this occurs. It is usually not dangerous. It can lead to problems if it happens often or it lasts a long time. °HOME CARE  °· Take medicines only as told by your doctor. °· Avoid caffeine or limit how much of it you consume as told by your doctor. Caffeine is found in coffee, tea, soda, and chocolate. °· Avoid alcohol or limit how much of it you drink as told by your doctor. °· Do not smoke. °· Try to get at least 7 hours of sleep each night. °· Find healthy ways to reduce stress. °· Do self-treatments as told by your doctor to slow down your heart (vagus nerve stimulation). Your doctor may tell you to: °¨ Hold your breath and push, as though you are going to the bathroom. °¨ Rub an area on one side of your neck. °¨ Bend forward with your head between your legs. °¨ Bend forward with your head between your legs, then cough. °¨ Rub your eyeballs with your eyes closed. °· Maintain a healthy weight. °· Get some exercise on most days. Ask your doctor about some good activities for you. °GET HELP IF: °· You are having episodes of a fast heartbeat more often. °· Your episodes are lasting longer. °· Your self-treatments to slow down your heart are no longer helping. °· You have new symptoms during an episode. °GET HELP RIGHT AWAY IF: °· You have chest pain. °· You have trouble breathing. °· You have an episode of a fast heartbeat that lasts longer than 20 minutes. °· You pass out (faint). °These symptoms may be an emergency. Do not wait to see if the symptoms will go away. Get medical help right away. Call your local emergency services  (911 in the [place] Do not drive yourself to the hospital. °  °This information is not intended to replace advice given to you by your health care provider. Make sure you discuss any questions you have with your health care provider. °  °Document Released: 01/05/2005 Document Revised: 01/26/2014 Document Reviewed: 06/15/2013 °Elsevier Interactive Patient Education ©2016 Elsevier Inc. ° °

## 2015-02-21 ENCOUNTER — Encounter: Payer: Self-pay | Admitting: Cardiovascular Disease

## 2015-02-21 ENCOUNTER — Ambulatory Visit (INDEPENDENT_AMBULATORY_CARE_PROVIDER_SITE_OTHER): Payer: Self-pay | Admitting: Cardiovascular Disease

## 2015-02-21 ENCOUNTER — Encounter (INDEPENDENT_AMBULATORY_CARE_PROVIDER_SITE_OTHER): Payer: Self-pay

## 2015-02-21 VITALS — BP 90/60 | HR 67 | Ht 62.0 in | Wt 181.0 lb

## 2015-02-21 DIAGNOSIS — I471 Supraventricular tachycardia, unspecified: Secondary | ICD-10-CM | POA: Insufficient documentation

## 2015-02-21 DIAGNOSIS — Z720 Tobacco use: Secondary | ICD-10-CM

## 2015-02-21 MED ORDER — METOPROLOL TARTRATE 25 MG PO TABS: 25.0000 mg | ORAL_TABLET | Freq: Two times a day (BID) | ORAL | Status: DC | PRN

## 2015-02-21 NOTE — Assessment & Plan Note (Signed)
The patient has known documented history of supraventricular tachycardia. The frequency of episodes have progressed significantly over the last 4 months as she had 3 emergency room visits. She is already trying to cut down on caffeine intake. She is already aware of vagal maneuvers. Her blood pressure is too low to add a medication on a regular basis. Thus, I provided her with metoprolol 25 mg to be used as needed for breakthrough SVT. I don't have any of the rhythm strips when she was an actual SVT. I'm requesting an echocardiogram to ensure no structural heart abnormalities. I'm referring the patient to Dr. Graciela Husbands to consider ablation.

## 2015-02-21 NOTE — Patient Instructions (Addendum)
Medication Instructions:  Your physician has recommended you make the following change in your medication:  START taking metoprolol  twice daily as needed for tachycardia.   Labwork: none  Testing/Procedures: Your physician has requested that you have an echocardiogram. Echocardiography is a painless test that uses sound waves to create images of your heart. It provides your doctor with information about the size and shape of your heart and how well your heart's chambers and valves are working. This procedure takes approximately one hour. There are no restrictions for this procedure.    Follow-Up: Referral to Dr. Graciela Husbands - EP  Any Other Special Instructions Will Be Listed Below (If Applicable).     If you need a refill on your cardiac medications before your next appointment, please call your pharmacy.  Echocardiogram An echocardiogram, or echocardiography, uses sound waves (ultrasound) to produce an image of your heart. The echocardiogram is simple, painless, obtained within a short period of time, and offers valuable information to your health care provider. The images from an echocardiogram can provide information such as:  Evidence of coronary artery disease (CAD).  Heart size.  Heart muscle function.  Heart valve function.  Aneurysm detection.  Evidence of a past heart attack.  Fluid buildup around the heart.  Heart muscle thickening.  Assess heart valve function. LET [facility] CARE PROVIDER KNOW ABOUT:  Any allergies you have.  All medicines you are taking, including vitamins, herbs, eye drops, creams, and over-the-counter medicines.  Previous problems you or members of your family have had with the use of anesthetics.  Any blood disorders you have.  Previous surgeries you have had.  Medical conditions you have.  Possibility of pregnancy, if this applies. BEFORE THE PROCEDURE  No special preparation is needed. Eat and drink normally.  PROCEDURE    In order to produce an image of your heart, gel will be applied to your chest and a wand-like tool (transducer) will be moved over your chest. The gel will help transmit the sound waves from the transducer. The sound waves will harmlessly bounce off your heart to allow the heart images to be captured in real-time motion. These images will then be recorded.  You may need an IV to receive a medicine that improves the quality of the pictures. AFTER THE PROCEDURE You may return to your normal schedule including diet, activities, and medicines, unless your health care provider tells you otherwise.   This information is not intended to replace advice given to you by your health care provider. Make sure you discuss any questions you have with your health care provider.   Document Released: 01/03/2000 Document Revised: 01/26/2014 Document Reviewed: 09/12/2012 Elsevier Interactive Patient Education [employer]

## 2015-02-21 NOTE — Progress Notes (Signed)
HPI  This is a 50 year old female who was referred from the emergency room at [facility] for evaluation of paroxysmal supraventricular tachycardia. She has prolonged history of tobacco use but otherwise no chronic medical conditions. She smokes one and a half pack per day. She works as a Lawyer at [employer]. She has no family history of coronary artery disease, arrhythmia or sudden death. She reports her first episode of supraventricular tachycardia 2 years ago and she was treated at [facility] emergency room. Her episodes became much more frequent over the last few months. She had an episode in September 2016. Her heart rate was reported to be 220 by EMS and she converted to normal sinus rhythm when they were trying to place an IV. She had a second episode on January 26 of this year. She was given adenosine by EMS and converted to sinus rhythm. She had a third episode yesterday and went to the emergency room. She was able to convert to sinus rhythm with vagal maneuvers. I do not have any EKGs or rhythm strips while she is in SVT. During these episodes, she complains of palpitations, dizziness but no syncope. She denies any chest pain. She has chronic exertional dyspnea that she attributes to smoking.  Allergies  Allergen Reactions  . Codeine Itching, Nausea And Vomiting and Other (See Comments)    Sweaty, severe vomiting, room spinning.      Current Outpatient Prescriptions on File Prior to Visit  Medication Sig Dispense Refill  . LORazepam (ATIVAN) 0.5 MG tablet Take 0.5 mg by mouth every 8 (eight) hours as needed for anxiety.      No current facility-administered medications on file prior to visit.     Past Medical History  Diagnosis Date  . SVT (supraventricular tachycardia) [facility])      Past Surgical History  Procedure Laterality Date  . Cesarean section       Family History  Problem Relation Age of Onset  . Family history: No family history of coronary artery disease, arrhythmia or  sudden death. Grandfather had some type of unspecified heart disease.      Social History   Social History  . Marital Status: Single    Spouse Name: N/A  . Number of Children: N/A  . Years of Education: N/A   Occupational History  . Not on file.   Social History Main Topics  . Smoking status: Current Every Day Smoker -- 1.50 packs/day for 30 years    Types: Cigarettes  . Smokeless tobacco: Not on file  . Alcohol Use: Yes  . Drug Use: Not on file  . Sexual Activity: Not on file   Other Topics Concern  . Not on file   Social History Narrative     ROS A 10 point review of system was performed. It is negative other than that mentioned in the history of present illness.   PHYSICAL EXAM   BP 90/60 mmHg  Pulse 67  Ht  (1.575 m)  Wt 181 lb (82.101 kg)  BMI 33.10 kg/m2  LMP 06/27/2014 Constitutional: She is oriented to person, place, and time. She appears well-developed and well-nourished. No distress.  HENT: No nasal discharge.  Head: Normocephalic and atraumatic.  Eyes: Pupils are equal and round. No discharge.  Neck: Normal range of motion. Neck supple. No JVD present. No thyromegaly present.  Cardiovascular: Normal rate, regular rhythm, normal heart sounds. Exam reveals no gallop and no friction rub. No murmur heard.  Pulmonary/Chest: Effort normal and breath  sounds normal. No stridor. No respiratory distress. She has no wheezes. She has no rales. She exhibits no tenderness.  Abdominal: Soft. Bowel sounds are normal. She exhibits no distension. There is no tenderness. There is no rebound and no guarding.  Musculoskeletal: Normal range of motion. She exhibits no edema and no tenderness.  Neurological: She is alert and oriented to person, place, and time. Coordination normal.  Skin: Skin is warm and dry. No rash noted. She is not diaphoretic. No erythema. No pallor.  Psychiatric: She has a normal mood and affect. Her behavior is normal. Judgment and thought content  normal.     EKG: Normal sinus rhythm with no significant ST or T wave changes. Normal QT interval. No clear evidence of delta waves.   ASSESSMENT AND PLAN

## 2015-02-21 NOTE — Assessment & Plan Note (Signed)
I discussed with her the importance of smoking cessation. She reports being under significant stress with difficulty in quitting. She also attributes her arrhythmia actually to stress and anxiety.

## 2015-03-04 ENCOUNTER — Other Ambulatory Visit: Payer: Self-pay

## 2015-03-04 ENCOUNTER — Ambulatory Visit (INDEPENDENT_AMBULATORY_CARE_PROVIDER_SITE_OTHER): Payer: Self-pay

## 2015-03-04 DIAGNOSIS — I471 Supraventricular tachycardia, unspecified: Secondary | ICD-10-CM

## 2015-03-05 ENCOUNTER — Ambulatory Visit: Payer: Self-pay | Admitting: Cardiovascular Disease

## 2015-03-11 ENCOUNTER — Ambulatory Visit: Payer: Medicaid Other | Admitting: Cardiovascular Disease

## 2015-03-28 ENCOUNTER — Institutional Professional Consult (permissible substitution): Payer: Self-pay | Admitting: Internal Medicine

## 2015-05-16 ENCOUNTER — Institutional Professional Consult (permissible substitution): Payer: Self-pay | Admitting: Internal Medicine

## 2015-05-16 ENCOUNTER — Encounter: Payer: Self-pay | Admitting: *Deleted

## 2016-02-20 ENCOUNTER — Telehealth: Payer: Self-pay | Admitting: Cardiovascular Disease

## 2016-02-20 ENCOUNTER — Emergency Department
Admission: EM | Admit: 2016-02-20 | Discharge: 2016-02-20 | Disposition: A | Discharge status: Home / Self Care | Payer: Medicaid Other | Attending: Emergency Medicine | Admitting: Emergency Medicine

## 2016-02-20 ENCOUNTER — Encounter: Payer: Self-pay | Admitting: Emergency Medicine

## 2016-02-20 DIAGNOSIS — I471 Supraventricular tachycardia: Secondary | ICD-10-CM

## 2016-02-20 DIAGNOSIS — F17210 Nicotine dependence, cigarettes, uncomplicated: Secondary | ICD-10-CM | POA: Insufficient documentation

## 2016-02-20 LAB — URINALYSIS, COMPLETE (UACMP) WITH MICROSCOPIC
Bilirubin Urine: NEGATIVE
Glucose, UA: NEGATIVE mg/dL
HGB URINE DIPSTICK: NEGATIVE
Ketones, ur: NEGATIVE mg/dL
Leukocytes, UA: NEGATIVE
Nitrite: NEGATIVE
Protein, ur: NEGATIVE mg/dL
SPECIFIC GRAVITY, URINE: 1.004 — AB (ref 1.005–1.030)
pH: 7 (ref 5.0–8.0)

## 2016-02-20 LAB — CBC
HCT: 40 % (ref 35.0–47.0)
HEMOGLOBIN: 14 g/dL (ref 12.0–16.0)
MCH: 31.1 pg (ref 26.0–34.0)
MCHC: 35.1 g/dL (ref 32.0–36.0)
MCV: 88.7 fL (ref 80.0–100.0)
Platelets: 212 10*3/uL (ref 150–440)
RBC: 4.51 MIL/uL (ref 3.80–5.20)
RDW: 12.2 % (ref 11.5–14.5)
WBC: 7.5 10*3/uL (ref 3.6–11.0)

## 2016-02-20 LAB — BASIC METABOLIC PANEL
ANION GAP: 8 (ref 5–15)
BUN: 12 mg/dL (ref 6–20)
CALCIUM: 9 mg/dL (ref 8.9–10.3)
CO2: 22 mmol/L (ref 22–32)
CREATININE: 0.63 mg/dL (ref 0.44–1.00)
Chloride: 107 mmol/L (ref 101–111)
GFR calc Af Amer: >60 mL/min (ref >60)
GFR calc non Af Amer: >60 mL/min (ref >60)
Glucose, Bld: 113 mg/dL — ABNORMAL HIGH (ref 65–99)
Potassium: 3.6 mmol/L (ref 3.5–5.1)
Sodium: 137 mmol/L (ref 135–145)

## 2016-02-20 LAB — TROPONIN I

## 2016-02-20 MED ORDER — PROPRANOLOL HCL 20 MG PO TABS: 20.0000 mg | ORAL_TABLET | Freq: Every day | ORAL | 0 refills | Status: DC | PRN

## 2016-02-20 NOTE — Telephone Encounter (Signed)
Patient returning call s/p [facility] ed for svt .   Scheduled with Ward Givenshris Berge on 02-24-16 at 2 pm for fu appt.  She stated someone left her a msg to call office but there is not phone note from today.  Please call patient .

## 2016-02-20 NOTE — Discharge Instructions (Signed)
Please follow-up with cardiology by calling the number provided to arrange a follow-up appointment. Please take your prescribed medication as needed for rapid heartbeat, up to once daily. Return to the emergency department for any further episodes of rapid heart rate, any chest pain, trouble breathing, or any other symptom personally concerning to yourself.

## 2016-02-20 NOTE — Telephone Encounter (Signed)
Returned call to patient. Could not find where anyone had called her from nursing standpoint. Confirmed appt for 02/24/16 at 2 pm. She verbalized understanding and no further questions or concerns at this time.

## 2016-02-20 NOTE — ED Provider Notes (Signed)
[facility] Emergency Department Provider Note  Time seen: 10:52 AM  I have reviewed the triage vital signs and the nursing notes.   HISTORY  Chief Complaint Tachycardia    [name] is a 51 y.o. female with a past medical history of paroxysmal SVT who presents to the emergency department for SVT. According to the patient this morning she was fixing breakfast when she felt her heart start beating very fast. She got very lightheaded. Attempted to get herself out of SVT at home without success so she called EMS. EMS administered 6 mg of adenosine with good response and returned to normal sinus rhythm. Upon arrival patient states she is feeling better with mild lightheadedness currently. Denies any chest pain or pressure. Denies any shortness of breath.Patient states she has been in and out of SVT over the past 2-3 years. The last of which occurred approximately 6 months ago.  Past Medical History:  Diagnosis Date  . SVT (supraventricular tachycardia) [facility](HCC)     Patient Active Problem List   Diagnosis Date Noted  . Paroxysmal supraventricular tachycardia (HCC) 02/21/2015  . Tobacco use 02/21/2015    Past Surgical History:  Procedure Laterality Date  . CESAREAN SECTION      Prior to Admission medications   Medication Sig Start Date End Date Taking? Authorizing Provider  LORazepam (ATIVAN) 0.5 MG tablet Take 0.5 mg by mouth every 8 (eight) hours as needed for anxiety.     Historical Provider, MD  metoprolol tartrate (LOPRESSOR) 25 MG tablet Take 1 tablet (25 mg total) by mouth 2 (two) times daily as needed (for tachycardia). 02/21/15   Iran OuchMuhammad A Arida, MD    Allergies  Allergen Reactions  . Codeine Itching, Nausea And Vomiting and Other (See Comments)    Sweaty, severe vomiting, room spinning.     Family History  Problem Relation Age of Onset  . Family history unknown: Yes    Social History Social History  Substance Use Topics  . Smoking  status: Current Every Day Smoker    Packs/day: 1.50    Years: 30.00    Types: Cigarettes  . Smokeless tobacco: Never Used  . Alcohol use Yes    Review of Systems Constitutional: Negative for fever. Cardiovascular: Negative for chest pain. Respiratory: Negative for shortness of breath. Gastrointestinal: Negative for abdominal pain Neurological: Negative for headache 10-point ROS otherwise negative.  ____________________________________________   PHYSICAL EXAM:  VITAL SIGNS: ED Triage Vitals  Enc Vitals Group     BP 02/20/16 1021 118/77     Pulse Rate 02/20/16 1021 82     Resp 02/20/16 1021 18     Temp 02/20/16 1021 98 F (36.7 C)     Temp Source 02/20/16 1021 Oral     SpO2 02/20/16 1021 98 %     Weight 02/20/16 1021 180 lb (81.6 kg)     Height 02/20/16 1021 5\' 2"  (1.575 m)     Head Circumference --      Peak Flow --      Pain Score 02/20/16 1022 1     Pain Loc --      Pain Edu? --      Excl. in GC? --     Constitutional: Alert and oriented. Well appearing and in no distress. Eyes: Normal exam ENT   Head: Normocephalic and atraumatic.   Mouth/Throat: Mucous membranes are moist. Cardiovascular: Normal rate, regular rhythm. No murmur Respiratory: Normal respiratory effort without tachypnea nor retractions. Breath sounds  are clear Gastrointestinal: Soft and nontender. No distention.   Musculoskeletal: Nontender with normal range of motion in all extremities.  Neurologic:  Normal speech and language. No gross focal neurologic deficits Skin:  Skin is warm, dry and intact.  Psychiatric: Mood and affect are normal.  ____________________________________________    EKG  EKG reviewed and interpreted by myself shows normal sinus rhythm at 79 bpm, narrow QRS, normal axis, normal intervals, no ST changes.  ____________________________________________   INITIAL IMPRESSION / ASSESSMENT AND PLAN / ED COURSE  Pertinent labs & imaging results that were available  during my care of the patient were reviewed by me and considered in my medical decision making (see chart for details).  The patient presents the emergency department with SVT. Converted according to EMS to normal sinus rhythm. Patient is a normal sinus rhythm upon arrival. States mild lightheadedness as her only symptom currently. We will check labs, and closely monitor in the emergency department.  Patient's labs are largely within normal limits. Troponin negative. Chemistry normal. Patient has remained in normal sinus rhythm. We will discharge the patient home with cardiology follow-up. Patient agreeable plan.  ____________________________________________   FINAL CLINICAL IMPRESSION(S) / ED DIAGNOSES  SVT    Minna Antis, MD 02/20/16 1220

## 2016-02-20 NOTE — ED Triage Notes (Signed)
Patient presents to the ED via EMS from home after patient was having shortness of breath and tachycardia.  Patient has history of episodes of SVT.  EMS gave patient 6mg  of adenisone and heart rhythm converted to normal sinus rhythm.  Patient is still complaining of feeling weird and dizzy.  Patient states she was surfing the internet when symptoms started.  Patient states symptoms felt the worst they ever have in the past.  Alert and oriented x 4.   No obvious distress at this time.

## 2016-02-23 ENCOUNTER — Encounter: Payer: Self-pay | Admitting: Emergency Medicine

## 2016-02-23 ENCOUNTER — Emergency Department: Payer: Medicaid Other

## 2016-02-23 ENCOUNTER — Emergency Department
Admission: EM | Admit: 2016-02-23 | Discharge: 2016-02-23 | Disposition: A | Discharge status: Home / Self Care | Payer: Medicaid Other | Attending: Emergency Medicine | Admitting: Emergency Medicine

## 2016-02-23 DIAGNOSIS — Z5321 Procedure and treatment not carried out due to patient leaving prior to being seen by health care provider: Secondary | ICD-10-CM | POA: Insufficient documentation

## 2016-02-23 DIAGNOSIS — Z79899 Other long term (current) drug therapy: Secondary | ICD-10-CM | POA: Insufficient documentation

## 2016-02-23 DIAGNOSIS — F17210 Nicotine dependence, cigarettes, uncomplicated: Secondary | ICD-10-CM | POA: Insufficient documentation

## 2016-02-23 DIAGNOSIS — R0789 Other chest pain: Secondary | ICD-10-CM | POA: Insufficient documentation

## 2016-02-23 DIAGNOSIS — R110 Nausea: Secondary | ICD-10-CM | POA: Insufficient documentation

## 2016-02-23 DIAGNOSIS — R42 Dizziness and giddiness: Secondary | ICD-10-CM | POA: Insufficient documentation

## 2016-02-23 LAB — BASIC METABOLIC PANEL
ANION GAP: 6 (ref 5–15)
BUN: 11 mg/dL (ref 6–20)
CHLORIDE: 105 mmol/L (ref 101–111)
CO2: 25 mmol/L (ref 22–32)
Calcium: 8.9 mg/dL (ref 8.9–10.3)
Creatinine, Ser: 0.67 mg/dL (ref 0.44–1.00)
Glucose, Bld: 89 mg/dL (ref 65–99)
POTASSIUM: 3.7 mmol/L (ref 3.5–5.1)
SODIUM: 136 mmol/L (ref 135–145)

## 2016-02-23 LAB — CBC
HEMATOCRIT: 37.9 % (ref 35.0–47.0)
HEMOGLOBIN: 13.1 g/dL (ref 12.0–16.0)
MCH: 30.5 pg (ref 26.0–34.0)
MCHC: 34.6 g/dL (ref 32.0–36.0)
MCV: 88.3 fL (ref 80.0–100.0)
Platelets: 210 10*3/uL (ref 150–440)
RBC: 4.29 MIL/uL (ref 3.80–5.20)
RDW: 12.4 % (ref 11.5–14.5)
WBC: 8.5 10*3/uL (ref 3.6–11.0)

## 2016-02-23 LAB — TROPONIN I: Troponin I: <0.03 ng/mL (ref <0.03)

## 2016-02-23 NOTE — ED Triage Notes (Signed)
Pt presents to ED with previous dx of SVT c/o chest pressure radiating to left shoulder for 1 hour. Pt has not taken any medication for relief of symptoms. Nausea, dizziness as other s/s. Alert and oriented x4

## 2016-02-23 NOTE — ED Notes (Signed)
Called in waiting room, no answer

## 2016-02-23 NOTE — ED Notes (Signed)
Called in waiting room, no answer x 3.

## 2016-02-23 NOTE — ED Notes (Signed)
Called in waiting room to move to treatment area.  No response.

## 2016-02-24 ENCOUNTER — Ambulatory Visit (INDEPENDENT_AMBULATORY_CARE_PROVIDER_SITE_OTHER): Payer: BLUE CROSS/BLUE SHIELD | Admitting: Cardiovascular Disease

## 2016-02-24 ENCOUNTER — Encounter: Payer: Self-pay | Admitting: Cardiovascular Disease

## 2016-02-24 ENCOUNTER — Telehealth: Payer: Self-pay

## 2016-02-24 ENCOUNTER — Telehealth: Payer: Self-pay | Admitting: Emergency Medicine

## 2016-02-24 VITALS — BP 116/60 | HR 83 | Ht 62.0 in | Wt 181.8 lb

## 2016-02-24 DIAGNOSIS — I471 Supraventricular tachycardia: Secondary | ICD-10-CM

## 2016-02-24 DIAGNOSIS — Z720 Tobacco use: Secondary | ICD-10-CM | POA: Diagnosis not present

## 2016-02-24 MED ORDER — VARENICLINE TARTRATE 1 MG PO TABS: 1.0000 mg | ORAL_TABLET | Freq: Two times a day (BID) | ORAL | 4 refills | Status: DC

## 2016-02-24 NOTE — Telephone Encounter (Signed)
Called patient due to lwot to inquire about condition and follow up plans. No answer.  No voicemail available.

## 2016-02-24 NOTE — Patient Instructions (Addendum)
We will schedule an appt with Dr. Graciela HusbandsKlein for SVT ablation   Medication Instructions:   No medication changes made  Labwork:  No new labs needed  Testing/Procedures:  No further testing at this time   I recommend watching educational videos on topics of interest to you at:       [email or web address]  Enter code: HEARTCARE    Follow-Up: It was a pleasure seeing you in the office today. Please call us if you have new issues that need to be addressed before your next appt.  [phone]  Your physician wants you to follow-up in:  2 months with Dr. Kirke CorinArida   If you need a refill on your cardiac medications before your next appointment, please call your pharmacy.

## 2016-02-24 NOTE — Telephone Encounter (Signed)
Submitted PA request to [employer] for pt's Chantix. Pt is not currently on buproprion or nicotine replacement therapy. Pt's chart does not indicate h/o failure, contraindication, or intolerance to OTC nicotine patches/transdermal systems/OTC nicotine gum/OTC nicotine lozenge.

## 2016-02-24 NOTE — Progress Notes (Signed)
Cardiology Office Note  Date:  02/24/2016   ID:  Tanya Hardin, DOB 1965/02/16, MRN [id number]  PCP:  No PCP Per Patient   Chief Complaint  Patient presents with  . other    Hospital follow up. Patient was seen yesterday in the hospital for chest pain. Meds reviewed verbally with patient.      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 She has no family history of coronary artery disease, arrhythmia or sudden death, who presents for routine follow-up to the cardiology clinic for recent episode of SVT    first episode of supraventricular tachycardia 3 years ago and she was treated at [facility] emergency room.   episode in September 2016. Her heart rate was reported to be 220 by EMS and she converted to normal sinus rhythm when they were trying to place an IV.   second episode on February 14 2015   adenosine by EMS and converted to sinus rhythm.    third episode 02/20/2015 and went to the emergency room.  able to convert to sinus rhythm with vagal maneuvers.  July 2017: SVT, stopped on his own  02/20/2016 presents to the emergency department for SVT.   was fixing breakfast when she felt her heart start beating very fast.  got very lightheaded.   EMS administered 6 mg of adenosine,   returned to normal sinus rhythm.  Also recent episode of vertigo, went to the emergency room yesterday  She is very anxious about her SVT, would like a definitive treatment She does not want to take medications, even pill in the pocket approach Previously given metoprolol, she did not fill a prescription She is interested in ablation. Was nervous about the procedure last year, Recent episode of SVT scared her, she is now interested in ablation  EKG on today's visit shows normal sinus rhythm with rate 83 bpm, no significant ST or T-wave changes     PMH:   has a past medical history of SVT  (supraventricular tachycardia) (HCC).  PSH:    Past Surgical History:  Procedure Laterality Date  . CESAREAN SECTION      Current Outpatient Prescriptions  Medication Sig Dispense Refill  . LORazepam (ATIVAN) 0.5 MG tablet Take 0.5 mg by mouth every 8 (eight) hours as needed for anxiety.     . metoprolol tartrate (LOPRESSOR) 25 MG tablet Take 1 tablet (25 mg total) by mouth 2 (two) times daily as needed (for tachycardia). 60 tablet 3  . propranolol (INDERAL) 20 MG tablet Take 1 tablet (20 mg total) by mouth daily as needed. Take 1 tablet orally as needed for rapid heart rate. 10 tablet 0   No current facility-administered medications for this visit.      Allergies:   Codeine   Social History:  The patient  reports that she has been smoking Cigarettes.  She has a 45.00 pack-year smoking history. She has never used smokeless tobacco. She reports that she drinks alcohol.   Family History:   Family history is unknown by patient.    Review of Systems: Review of Systems  Constitutional: Negative.   Respiratory: Negative.   Cardiovascular: Positive for palpitations.       Tachycardia  Gastrointestinal: Negative.   Musculoskeletal: Negative.   Neurological: Negative.   Psychiatric/Behavioral: Negative.   All other systems reviewed and are negative.    PHYSICAL EXAM: VS:  BP 116/60 (BP  Location: Left Arm, Patient Position: Sitting, Cuff Size: Normal)   Pulse 83   Ht 5\' 2"  (1.575 m)   Wt 181 lb 12 oz (82.4 kg)   LMP 06/27/2014   BMI 33.24 kg/m  , BMI Body mass index is 33.24 kg/m. GEN: Well nourished, well developed, in no acute distress  HEENT: normal  Neck: no JVD, carotid bruits, or masses Cardiac: RRR; no murmurs, rubs, or gallops,no edema  Respiratory:  clear to auscultation bilaterally, normal work of breathing GI: soft, nontender, nondistended, + BS MS: no deformity or atrophy  Skin: warm and dry, no rash Neuro:  Strength and sensation are intact Psych: euthymic  mood, full affect    Recent Labs: 02/23/2016: BUN 11; Creatinine, Ser 0.67; Hemoglobin 13.1; Platelets 210; Potassium 3.7; Sodium 136    Lipid Panel No results found for: CHOL, HDL, LDLCALC, TRIG    Wt Readings from Last 3 Encounters:  02/24/16 181 lb 12 oz (82.4 kg)  02/23/16 180 lb (81.6 kg)  02/20/16 180 lb (81.6 kg)       ASSESSMENT AND PLAN:  Paroxysmal supraventricular tachycardia (HCC) - Plan: EKG 12-Lead Long discussion concerning various treatment options for her SVT Given that symptoms are getting worse, she is very scared about recurrent episodes, does not want pills, recommended she discuss ablation with Tanya Hardin. We did touch on risk and benefit of the procedure.  She would like a definitive treatment given it is happening more in the past several years We did also talk about other types of rhythms We have requested EKGs from the EMTs and will look through the hospital records for EKG strip documenting SVT.  Tobacco use - Plan: EKG 12-Lead Long discussion concerning her smoking We will call in a prescription for Chantix Directions provided  Disposition:   F/U 2 months with Tanya Hardin   Total encounter time more than 25 minutes  Greater than 50% was spent in counseling and coordination of care with the patient    Orders Placed This Encounter  Procedures  . EKG 12-Lead     Signed, Tanya Hardin, M.D., Ph.D. 02/24/2016  [facility] [place] [phone]

## 2016-02-25 NOTE — Telephone Encounter (Signed)
Spoke w/ pt.  Advised that ins will not cover Chantix, as she has not tried and failed nicotine replacements. Pt was given a $75 off coupon at her ov, she will call the pharmacy to see how much it will be. Offered pt other resources to help her quit, but she states that she has been smoking since age 51 and she cannot quit on her own. Asked her to call back if we can be of any further assistance.

## 2016-03-26 ENCOUNTER — Encounter: Payer: Self-pay | Admitting: Internal Medicine

## 2016-03-26 ENCOUNTER — Ambulatory Visit (INDEPENDENT_AMBULATORY_CARE_PROVIDER_SITE_OTHER): Payer: BLUE CROSS/BLUE SHIELD | Admitting: Internal Medicine

## 2016-03-26 VITALS — BP 108/76 | HR 68 | Ht 62.0 in | Wt 184.8 lb

## 2016-03-26 DIAGNOSIS — I471 Supraventricular tachycardia: Secondary | ICD-10-CM | POA: Diagnosis not present

## 2016-03-26 NOTE — Patient Instructions (Signed)
Medication Instructions: - Your physician recommends that you continue on your current medications as directed. Please refer to the Current Medication list given to you today.  Labwork: - none ordered  Procedures/Testing: - Your physician has recommended that you have a SVT ablation. Catheter ablation is a medical procedure used to treat some cardiac arrhythmias (irregular heartbeats). During catheter ablation, a long, thin, flexible tube is put into a blood vessel in your groin (upper thigh), or neck. This tube is called an ablation catheter. It is then guided to your heart through the blood vessel. Radio frequency waves destroy small areas of heart tissue where abnormal heartbeats may cause an arrhythmia to start.   ** Tanya Hardin or Renee will call you to schedule this **   Follow-Up: - pending procedure date  Any Additional Special Instructions Will Be Listed Below (If Applicable).     If you need a refill on your cardiac medications before your next appointment, please call your pharmacy.

## 2016-03-26 NOTE — Progress Notes (Signed)
ELECTROPHYSIOLOGY CONSULT NOTE  Patient ID: Tanya Hardin, MRN: [id number], DOB/AGE: October 26, 1965 51 y.o. Admit date: (Not on file) Date of Consult: 03/26/2016  Primary Physician: No PCP Per Patient Primary Cardiologist: MA/TG Consulting Physician TG  Chief Complaint: SVT   HPI Tanya DurieCindy L Hardin is a 51 y.o. female  With a few year history of recurrent abrupt onset offset tachypalpitations. They're associated with presyncope some chest discomfort and some shortness of breath. There diuretic positive but frog negative. There have been associated with bending over as well as caffeine. They occur to 3-5 times per year. She has not taken medications for them but does not want to.  This is partly because of tendency for low blood pressure (90-110.)  Echocardiogram 2/17 was normal  Seen in the emergency room 2/18 for a penicillin sensitive SVT;(  EMS- )  She smokes.  Past Medical History:  Diagnosis Date  . SVT (supraventricular tachycardia) [facility](HCC)       Surgical History:  Past Surgical History:  Procedure Laterality Date  . CESAREAN SECTION       Home Meds: Prior to Admission medications   Medication Sig Start Date End Date Taking? Authorizing Provider  LORazepam (ATIVAN) 0.5 MG tablet Take 0.5 mg by mouth every 8 (eight) hours as needed for anxiety.    Yes Historical Provider, MD  metoprolol tartrate (LOPRESSOR) 25 MG tablet Take 1 tablet (25 mg total) by mouth 2 (two) times daily as needed (for tachycardia). Patient not taking: Reported on 03/26/2016 02/21/15   Iran OuchMuhammad A Arida, MD  propranolol (INDERAL) 20 MG tablet Take 1 tablet (20 mg total) by mouth daily as needed. Take 1 tablet orally as needed for rapid heart rate. Patient not taking: Reported on 03/26/2016 02/20/16   Minna AntisKevin Paduchowski, MD  varenicline (CHANTIX CONTINUING MONTH PAK) 1 MG tablet Take 1 tablet (1 mg total) by mouth 2 (two) times daily. Patient not taking: Reported on 03/26/2016 02/24/16   Antonieta Ibaimothy J Gollan,  MD    Allergies:  Allergies  Allergen Reactions  . Codeine Itching, Nausea And Vomiting and Other (See Comments)    Sweaty, severe vomiting, room spinning.     Social History   Social History  . Marital status: Single    Spouse name: N/A  . Number of children: N/A  . Years of education: N/A   Occupational History  . Not on file.   Social History Main Topics  . Smoking status: Current Every Day Smoker    Packs/day: 1.50    Years: 30.00    Types: Cigarettes  . Smokeless tobacco: Never Used  . Alcohol use Yes  . Drug use: Unknown  . Sexual activity: Not on file   Other Topics Concern  . Not on file   Social History Narrative  . No narrative on file     Family History  Problem Relation Age of Onset  . Family history unknown: Yes     ROS:  Please see the history of present illness.     All other systems reviewed and negative.    Physical Exam: Blood pressure 108/76, pulse 68, height 5\' 2"  (1.575 m), weight 184 lb 12 oz (83.8 kg), last menstrual period 06/27/2014. General: Well developed, well nourished female in no acute distress. Head: Normocephalic, atraumatic, sclera non-icteric, no xanthomas, nares are without discharge. EENT: normal  Lymph Nodes:  none Neck: Negative for carotid bruits. JVD not elevated. Back:without scoliosis kyphosis Lungs: Clear bilaterally to auscultation without wheezes, rales,  or rhonchi. Breathing is unlabored. Heart: RRR with S1 S2. No murmur . No rubs, or gallops appreciated. Abdomen: Soft, non-tender, non-distended with normoactive bowel sounds. No hepatomegaly. No rebound/guarding. No obvious abdominal masses. Msk:  Strength and tone appear normal for age. Extremities: No clubbing or cyanosis. No** edema.  Distal pedal pulses are 2+ and equal bilaterally. Skin: Warm and Dry Neuro: Alert and oriented X 3. CN III-XII intact Grossly normal sensory and motor function . Psych:  Responds to questions appropriately with a normal  affect.      Labs: Cardiac Enzymes No results for input(s): CKTOTAL, CKMB, TROPONINI in the last 72 hours. CBC Lab Results  Component Value Date   WBC 8.5 02/23/2016   HGB 13.1 02/23/2016   HCT 37.9 02/23/2016   MCV 88.3 02/23/2016   PLT 210 02/23/2016   PROTIME: No results for input(s): LABPROT, INR in the last 72 hours. Chemistry No results for input(s): NA, K, CL, CO2, BUN, CREATININE, CALCIUM, PROT, BILITOT, ALKPHOS, ALT, AST, GLUCOSE in the last 168 hours.  Invalid input(s): LABALBU Lipids No results found for: CHOL, HDL, LDLCALC, TRIG BNP No results found for: PROBNP Thyroid Function Tests: No results for input(s): TSH, T4TOTAL, T3FREE, THYROIDAB in the last 72 hours.  Invalid input(s): FREET3 Miscellaneous No results found for: DDIMER  Radiology/Studies:  No results found.  EKG: Sinus rhythm at 68 Intervals 13/07/0 No evidence of ventricular preexcitation   Assessment and Plan:    SVT-adenosine sensitive     She has ECG documented (qualitative description [facility] everywhere 01/23/13) SVT. By description it could be either AV reentry or AV nodal reentry. We have discussed treatment options including medications which she does not want to take, intermittent Valsalva which has been variably helpful and catheter ablation. We discussed the potential risk of dying and heart block and vascular injury. Not withstanding, given the increasing severity of her symptoms she would like to proceed with catheter ablation.       Sherryl Manges

## 2016-04-21 ENCOUNTER — Telehealth: Payer: Self-pay | Admitting: *Deleted

## 2016-04-21 NOTE — Telephone Encounter (Signed)
Pt's only number on file not a working number. Left message w/ emergency contact to have pt call the office.  (need to schedule SVT ablation)

## 2016-04-28 NOTE — Telephone Encounter (Signed)
lmtcb w/ Angelee, per pt request, to rv procedure instructions.

## 2016-04-30 NOTE — Telephone Encounter (Signed)
Informed patient that I would speak with Dr. Ladona Ridgel next week to inquire if she could follow up in [facility] w/ [place] post ablation. Also will discuss if she will have to stay overnight. She understands I will call next week with the answers.

## 2016-05-05 NOTE — Telephone Encounter (Signed)
followup in [place] would be ok with me if patient prefers. GT

## 2016-05-06 NOTE — Telephone Encounter (Signed)
Follow Up ° ° ° ° ° ° ° °Pt returning phone call °

## 2016-05-06 NOTE — Telephone Encounter (Signed)
Spoke with pt and appt made for post ablation f/u w/ Graciela Husbands in [place]. Appt reminder mailed to home address, per pt request.

## 2016-05-06 NOTE — Telephone Encounter (Signed)
Informed dtr ok for pt to follow up in [place] w/ Graciela Husbands post ablation.   Dtr prefers mom to make her own f/u appt.  Attempted to reach patient several times but number listed is not a currently working number .

## 2016-05-08 ENCOUNTER — Telehealth: Payer: Self-pay | Admitting: Cardiovascular Disease

## 2016-05-08 NOTE — Telephone Encounter (Signed)
Pt calling stating she needs to reschedule her Cath next week Doesn't have a ride for this day Please call back to reschedule with her.   Pt doesn't have a phone so the number she left is her daughter's phone. We do have permission to talk to her Her name is Tanya Hardin

## 2016-05-08 NOTE — Telephone Encounter (Signed)
Pt is not scheduled for a cath - she is scheduled for ablation next week.  Will forward to Dr. Odessa Fleming nurse to reschedule.

## 2016-05-08 NOTE — Telephone Encounter (Signed)
Pt calling to reschedule SVT ablation for June.  She is in the process of stopping smoking and starting some light exercise (walking).  She would like to wait 6 weeks, after stopping smoking, before having procedure. Pt rescheduled to 07/01/16 w/ Ladona Ridgel. She understands post ablation follow up will be scheduled w/ Dr. Graciela Husbands in [facility] -- hospital will arrange this after procedure.   (Pt has never had surgery before, beside 2 emergent c-sections, and is VERY anxious.  She and I have spoken several times and I have tried to alleviate fears)

## 2016-06-18 ENCOUNTER — Ambulatory Visit: Payer: BLUE CROSS/BLUE SHIELD | Admitting: Internal Medicine

## 2016-06-18 ENCOUNTER — Encounter: Payer: Self-pay | Admitting: Internal Medicine

## 2016-07-01 ENCOUNTER — Ambulatory Visit (HOSPITAL_COMMUNITY)
Admission: RE | Admit: 2016-07-01 | Payer: BLUE CROSS/BLUE SHIELD | Source: Ambulatory Visit | Admitting: Internal Medicine

## 2016-07-01 ENCOUNTER — Encounter (HOSPITAL_COMMUNITY): Admission: RE | Payer: Self-pay | Source: Ambulatory Visit

## 2016-07-01 SURGERY — SVT ABLATION

## 2016-12-17 ENCOUNTER — Telehealth: Payer: Self-pay | Admitting: Cardiovascular Disease

## 2016-12-17 ENCOUNTER — Encounter: Payer: Self-pay | Admitting: Internal Medicine

## 2016-12-17 ENCOUNTER — Ambulatory Visit (INDEPENDENT_AMBULATORY_CARE_PROVIDER_SITE_OTHER): Payer: BLUE CROSS/BLUE SHIELD | Admitting: Internal Medicine

## 2016-12-17 VITALS — BP 108/73 | HR 83 | Ht 62.0 in | Wt 188.2 lb

## 2016-12-17 DIAGNOSIS — Z720 Tobacco use: Secondary | ICD-10-CM

## 2016-12-17 DIAGNOSIS — Z01810 Encounter for preprocedural cardiovascular examination: Secondary | ICD-10-CM | POA: Diagnosis not present

## 2016-12-17 DIAGNOSIS — I471 Supraventricular tachycardia: Secondary | ICD-10-CM

## 2016-12-17 NOTE — Telephone Encounter (Signed)
Patient c/o Palpitations:  High priority if patient c/o lightheadedness, shortness of breath, or chest pain  1) How long have you had palpitations/irregular HR/ Afib? Are you having the symptoms now? More frequent episodes of interim tachycardia   2) Are you currently experiencing lightheadedness, SOB or CP?  Lightheadedness sob cp nausea weak headache   3) Do you have a history of afib (atrial fibrillation) or irregular heart rhythm? Not until dx of tachycardia   4) Have you checked your BP or HR? (document readings if available): not checked but it was high  Currently 88  5) Are you experiencing any other symptoms?   PATIENT HOLDING FOR TRIAGE NURSE

## 2016-12-17 NOTE — Patient Instructions (Addendum)
Medication Instructions: Your physician recommends that you continue on your current medications as directed. Please refer to the Current Medication list given to you today.  Labwork: Your physician recommends that you return for lab work in: TODAY (CBC, BMP).   Procedures/Testing: Your physician has requested that you have a lexiscan myoview. For further information please visit [email or web address]. Please follow instruction sheet, as given.  [facility] MYOVIEW  Your caregiver has ordered a Stress Test with nuclear imaging. The purpose of this test is to evaluate the blood supply to your heart muscle. This procedure is referred to as a "Non-Invasive Stress Test." This is because other than having an IV started in your vein, nothing is inserted or "invades" your body. Cardiac stress tests are done to find areas of poor blood flow to the heart by determining the extent of coronary artery disease (CAD). Some patients exercise on a treadmill, which naturally increases the blood flow to your heart, while others who are  unable to walk on a treadmill due to physical limitations have a pharmacologic/chemical stress agent called Lexiscan . This medicine will mimic walking on a treadmill by temporarily increasing your coronary blood flow.   Please note: these test may take anywhere between 2-4 hours to complete  PLEASE REPORT TO [facility]  THE VOLUNTEERS AT THE FIRST DESK WILL DIRECT YOU WHERE TO GO  Date of Procedure:______12/3/18____________  Arrival Time for Procedure:________07:45 am________  Instructions regarding medication:   Patient not on any medications.   PLEASE NOTIFY THE OFFICE AT LEAST 24 HOURS IN ADVANCE IF YOU ARE UNABLE TO KEEP YOUR APPOINTMENT.  [phone] AND  PLEASE NOTIFY [facility] AT [facility] AT LEAST 24 HOURS IN ADVANCE IF YOU ARE UNABLE TO KEEP YOUR APPOINTMENT. [phone]  How to prepare for your Myoview test:  1. Do not eat or drink after  midnight 2. No caffeine for 24 hours prior to test 3. No smoking 24 hours prior to test. 4. Your medication may be taken with water.  If your doctor stopped a medication because of this test, do not take that medication. 5. Ladies, please do not wear dresses.  Skirts or pants are appropriate. Please wear a short sleeve shirt. 6. No perfume, cologne or lotion. 7. Wear comfortable walking shoes. No heels!     Your physician has recommended that you have an ablation for Supraventricular Tachycardia (SVT). Catheter ablation is a medical procedure used to treat some cardiac arrhythmias (irregular heartbeats). During catheter ablation, a long, thin, flexible tube is put into a blood vessel in your groin (upper thigh), or neck. This tube is called an ablation catheter. It is then guided to your heart through the blood vessel. Radio frequency waves destroy small areas of heart tissue where abnormal heartbeats may cause an arrhythmia to start. Please see the instruction sheet given to you today.   Follow-Up: Your physician recommends that you schedule a follow-up appointment in: 4-6 WEEKS with Dr. Graciela HusbandsKlein   If you need a refill on your cardiac medications before your next appointment, please call your pharmacy.

## 2016-12-17 NOTE — Progress Notes (Signed)
      Patient Care Team: Patient, No Pcp Per as PCP - General (General Practice)   HPI  Tanya Hardin is a 51 y.o. female Seen in followup for adenosine sensitive  SVT  Seen originally 3/18 w plans for ablation  She chickened out but now has been having increasingly frequent episodes, 2-3 weeks with Chest pain/sob/ Presyncope  No syncope  Mostly able to stop spells with Valsalva with duration 2-5 min  Also with DOE and some exertional chest pain  +smoking + Fam hx  4/ 11  LDL 127  Records and Results Reviewed  Date Cr K  2/18 0.67 3.7          Past Medical History:  Diagnosis Date  . SVT (supraventricular tachycardia) (HCC)     Past Surgical History:  Procedure Laterality Date  . CESAREAN SECTION      No current outpatient medications on file.   No current facility-administered medications for this visit.     Allergies  Allergen Reactions  . Codeine Itching, Nausea And Vomiting and Other (See Comments)    Sweaty, severe vomiting, room spinning.       Review of Systems negative except from HPI and PMH  Physical Exam BP 108/73 (BP Location: Left Arm, Patient Position: Sitting, Cuff Size: Normal)   Pulse 83   Ht 5\' 2"  (1.575 m)   Wt 188 lb 4 oz (85.4 kg)   LMP 06/27/2014   BMI 34.43 kg/m  Well developed and well nourished in no acute distress HENT normal E scleral and icterus clear Neck Supple JVP flat; carotids brisk and full Clear to ausculation Regular rate and rhythm, no murmurs gallops or rub Soft with active bowel sounds No clubbing cyanosis   Edema Alert and oriented, grossly normal motor and sensory function Skin Warm and Dry  ECG sinus 83 12/25/36  Assessment and  Plan SVT-adenosine sensitive   Dyspnea on exertion  Chest pain   Obesity  Cig use  Recurrent symptoms are increasingly distressing and she would like to proceed with catheter ablation at this time Have reviewed risks and benefits including death, stroke vascular  injury perforatin or heart block   She understands and is willing to proceed  Given her cardiac risk factors, we will undertake preprocedural myoview scan for risk stratification as we will be inducing tachycardia  Will check lipids  Encouraged to stop smoking   Reviewed with DR GT  More than 50% of 40 min was spent in counseling related to the above         Current medicines are reviewed at length with the patient today .  The patient does not have concerns regarding medicines.

## 2016-12-17 NOTE — Telephone Encounter (Signed)
Patient states that she is at work and she has been having some "SVT". She is unsure of how high her heart rates have been but states that right now it is 88. She reports that this has been happening more frequently 2-3 times a week. She has not been taking any medications for this and was previously scheduled for ablation and she canceled. Reviewed information briefly with Dr. Graciela HusbandsKlein and he was agreeable to add her on this AM to his schedule. She is leaving work now and heading over for 9:00 AM visit.

## 2016-12-18 LAB — CBC WITH DIFFERENTIAL/PLATELET
BASOS ABS: 0 10*3/uL (ref 0.0–0.2)
Basos: 0 %
EOS (ABSOLUTE): 0.1 10*3/uL (ref 0.0–0.4)
Eos: 1 %
Hematocrit: 39.3 % (ref 34.0–46.6)
Hemoglobin: 13.2 g/dL (ref 11.1–15.9)
IMMATURE GRANULOCYTES: 0 %
Immature Grans (Abs): 0 10*3/uL (ref 0.0–0.1)
Lymphocytes Absolute: 2.5 10*3/uL (ref 0.7–3.1)
Lymphs: 29 %
MCH: 30.8 pg (ref 26.6–33.0)
MCHC: 33.6 g/dL (ref 31.5–35.7)
MCV: 92 fL (ref 79–97)
MONOS ABS: 0.7 10*3/uL (ref 0.1–0.9)
Monocytes: 8 %
NEUTROS PCT: 62 %
Neutrophils Absolute: 5.5 10*3/uL (ref 1.4–7.0)
PLATELETS: 252 10*3/uL (ref 150–379)
RBC: 4.28 x10E6/uL (ref 3.77–5.28)
RDW: 13.3 % (ref 12.3–15.4)
WBC: 8.8 10*3/uL (ref 3.4–10.8)

## 2016-12-18 LAB — BASIC METABOLIC PANEL
BUN / CREAT RATIO: 13 (ref 9–23)
BUN: 9 mg/dL (ref 6–24)
CHLORIDE: 105 mmol/L (ref 96–106)
CO2: 23 mmol/L (ref 20–29)
Calcium: 9.5 mg/dL (ref 8.7–10.2)
Creatinine, Ser: 0.68 mg/dL (ref 0.57–1.00)
GFR calc Af Amer: 116 mL/min/{1.73_m2} (ref >59)
GFR calc non Af Amer: 101 mL/min/{1.73_m2} (ref >59)
GLUCOSE: 93 mg/dL (ref 65–99)
Potassium: 4.6 mmol/L (ref 3.5–5.2)
SODIUM: 140 mmol/L (ref 134–144)

## 2016-12-21 ENCOUNTER — Ambulatory Visit
Admission: RE | Admit: 2016-12-21 | Discharge: 2016-12-21 | Disposition: A | Discharge status: Home / Self Care | Payer: BLUE CROSS/BLUE SHIELD | Source: Ambulatory Visit | Attending: Internal Medicine | Admitting: Internal Medicine

## 2016-12-21 ENCOUNTER — Telehealth: Payer: Self-pay | Admitting: Internal Medicine

## 2016-12-21 DIAGNOSIS — I471 Supraventricular tachycardia: Secondary | ICD-10-CM | POA: Insufficient documentation

## 2016-12-21 NOTE — Telephone Encounter (Signed)
Pt reports today's nuclear stress test rescheduled because she smoked 3-4 cigarettes this morning.  She had one more cigarette at 3:30pm today and inquires if she can still have test tomorrow.  S/w Dr. Graciela HusbandsKlein. She may have nuclear test if she does not smoke any more today or in the morning. [facility] and [facility].   Confirmed 7:15am arrival

## 2016-12-21 NOTE — Telephone Encounter (Signed)
Patient calling about her procedure  She was to have it done today but it has now been rescheduled for tomorrow Has a few questions Please call to discuss

## 2016-12-22 ENCOUNTER — Encounter: Admission: RE | Admit: 2016-12-22 | Payer: BLUE CROSS/BLUE SHIELD | Source: Ambulatory Visit

## 2016-12-22 ENCOUNTER — Telehealth: Payer: Self-pay

## 2016-12-22 NOTE — Telephone Encounter (Signed)
-----   Message from Duke SalviaSteven C Klein, MD sent at 12/20/2016 11:49 AM EST ----- Please Inform Patient that labs are norma  Thanks

## 2016-12-22 NOTE — Telephone Encounter (Signed)
Pt it aware and agreeable to normal results She asked about her chlolesterol and I informed her that we did not check a lipid panel or anything pertaining to her cholesterol. She informed me that she would like it checked and needs to find a PCP as she is pretty sure her cholesterol is "bad" because she is overweight I offered to give her the number for [facility] to help her find a PCP but she declined and said she would find one on her own

## 2016-12-23 ENCOUNTER — Telehealth: Payer: Self-pay | Admitting: Internal Medicine

## 2016-12-23 NOTE — Telephone Encounter (Signed)
Called Nuclear Medicine and left message that patient had to cancel for tomorrow.  Called patient and she is rescheduled for 12/29/16. Patient aware of appointment date and time change.  We discussed changing her ablation date as well and prefers Monday, 12/28/16. Advised her I will contact her sometime tomorrow regarding procedure change once rescheduled. She was appreciative.

## 2016-12-23 NOTE — Telephone Encounter (Signed)
Pt calling stating she is needing to reschedule her Myoview and ablation on Monday  She needs to reschedule both for her daughter is sick   Please call back

## 2016-12-24 NOTE — Telephone Encounter (Signed)
Patient number not working.  Tried calling patient's daughter, Verdene Lennertngelee, ok per DPR. No answer. Left message to call back.

## 2016-12-24 NOTE — Telephone Encounter (Signed)
Called Crystal at the Cath lab. Rescheduled SVT ablation to be on 01/06/17 at 12:30 pm, arrival at 10:30am.

## 2016-12-24 NOTE — Telephone Encounter (Signed)
Attempted to reach patient. Received message that "The number you have reached has been disconnected or is no longer in service." Unable to leave a message.

## 2016-12-25 NOTE — Telephone Encounter (Signed)
Patient's phone number not available.   Called and s/w daughter. Daughter said she will see patient later today and will let her know to call us next week when office reopens. She does not know her work schedule. I did let her know SVT ablation is scheduled for 01/06/17.  She will have patient call next week.

## 2016-12-28 IMAGING — CR DG CHEST 2V
2 series · 2 of 2 positions shown · non-contrast
Comparison: None.

CLINICAL DATA: 49-year-old female with supra ventricular
tachycardia. Dizziness. Smoker. Initial encounter.

EXAM:
CHEST  2 VIEW

[chest pa]
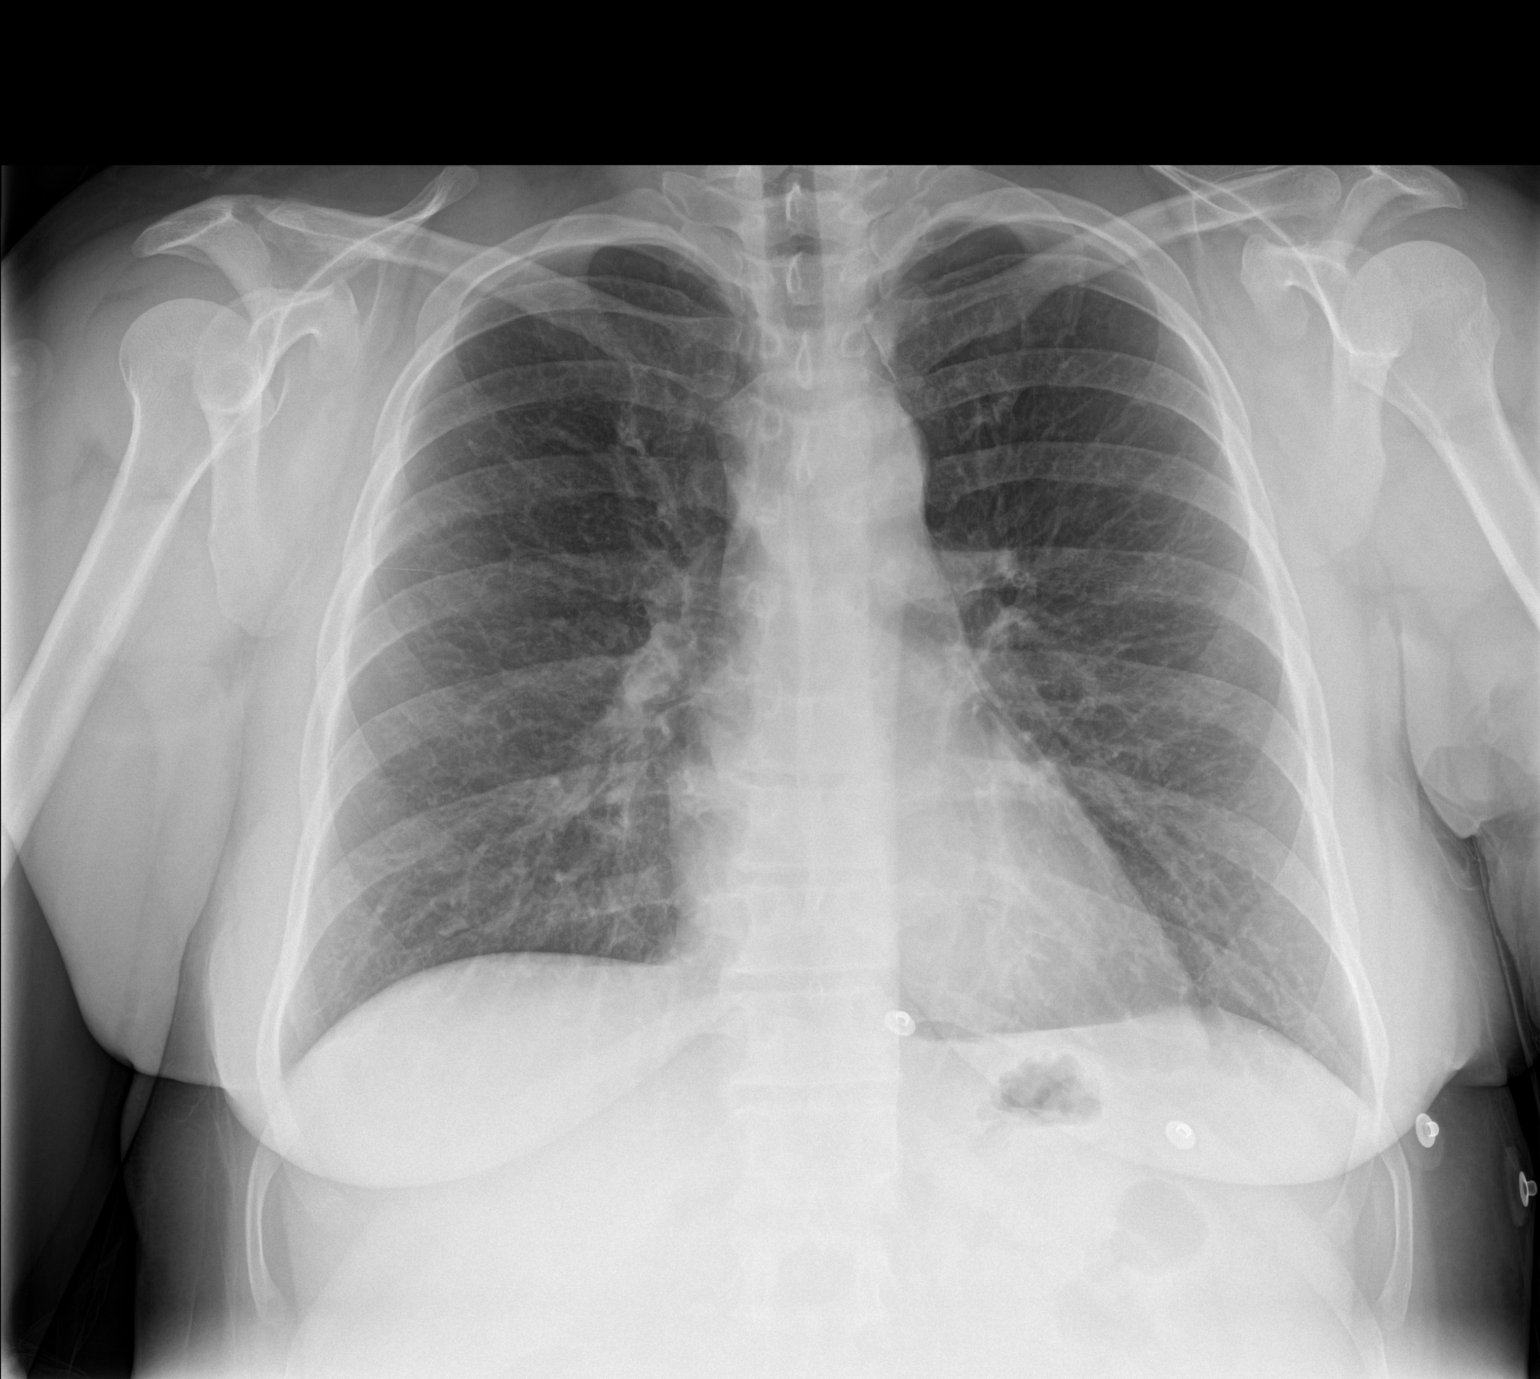

[chest lat]
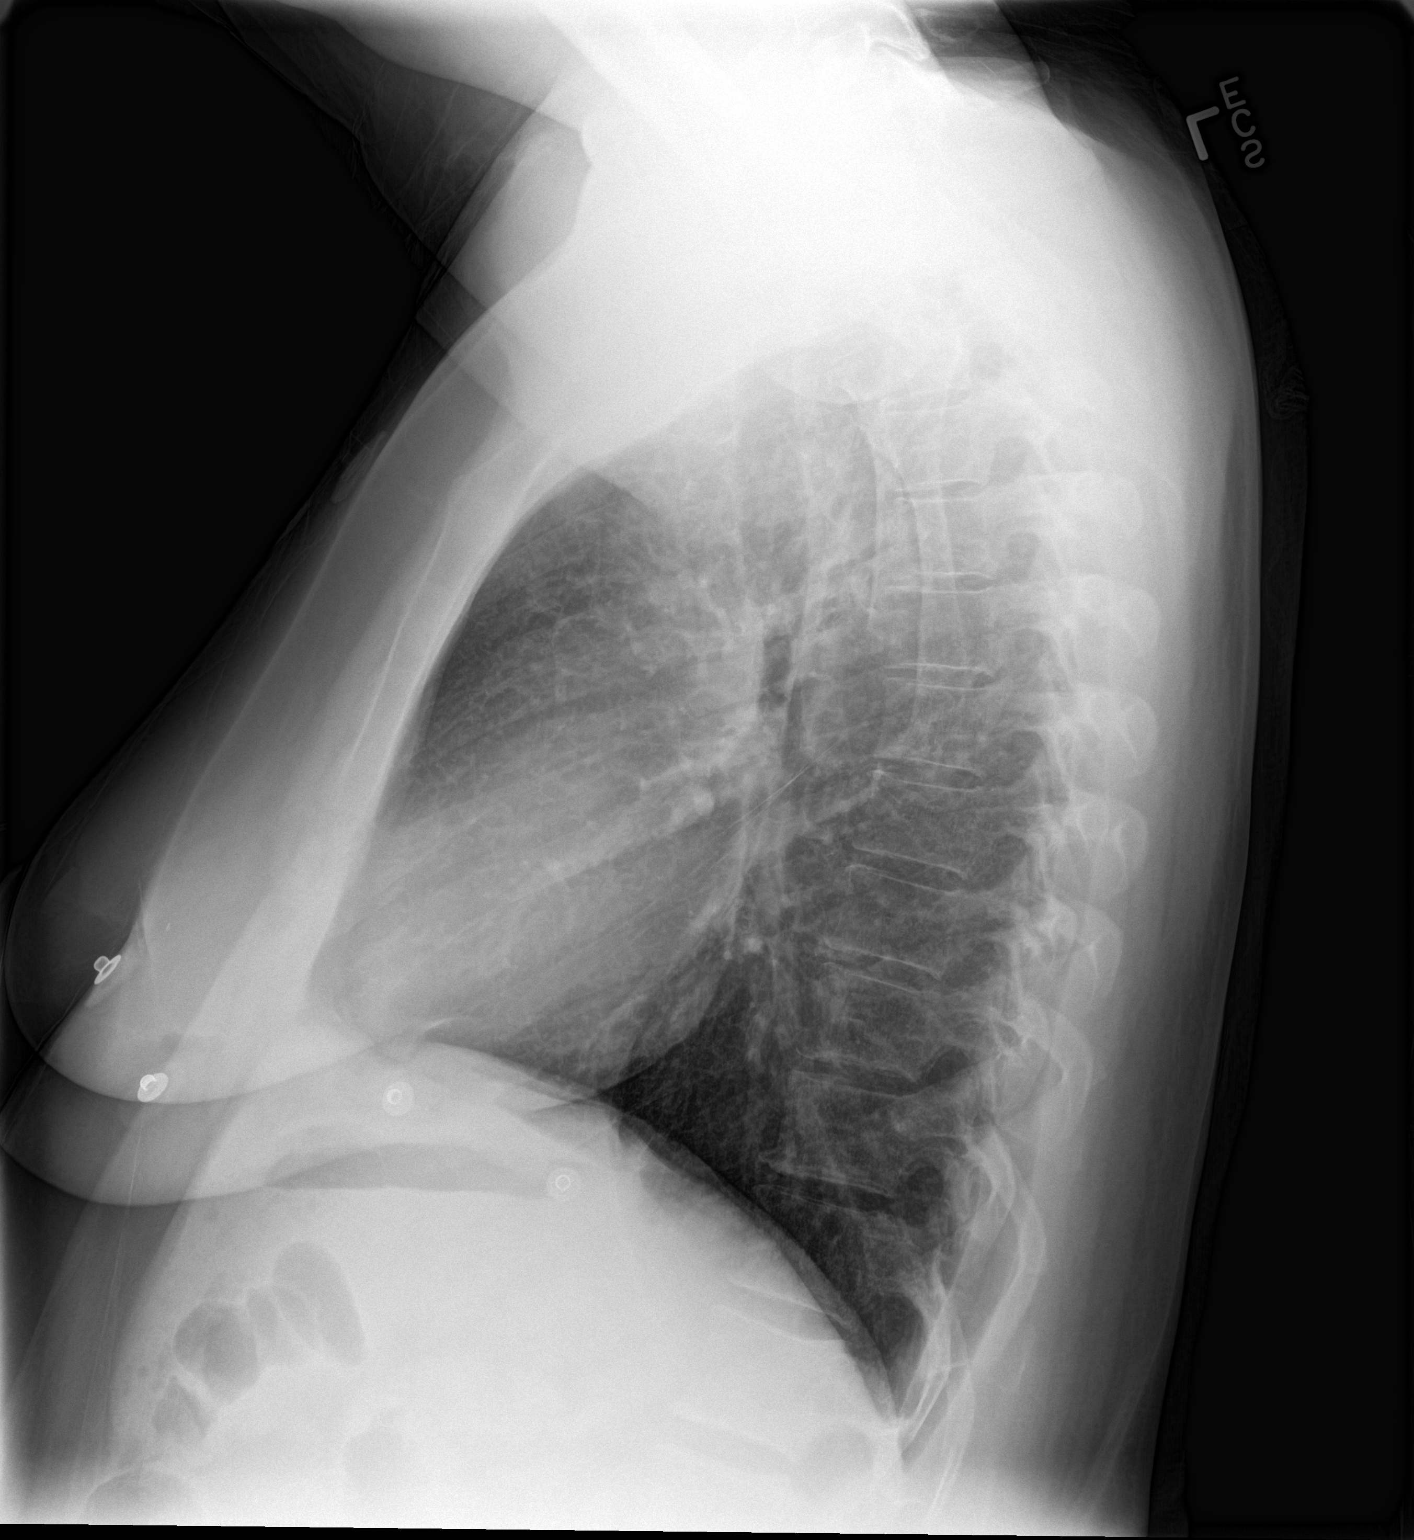

[2 of 2 positions shown; findings below may reference images not displayed]

FINDINGS: Lung volumes within normal limits. Normal cardiac size and
mediastinal contours. Visualized tracheal air column is within
normal limits. Mild increased interstitial markings diffusely.
Otherwise the lungs are clear. No pneumothorax or pleural effusion.
No acute osseous abnormality identified.
IMPRESSION: No acute cardiopulmonary abnormality.

Mild increased interstitial markings suspected due to smoking.

## 2016-12-29 ENCOUNTER — Encounter: Admission: RE | Admit: 2016-12-29 | Payer: BLUE CROSS/BLUE SHIELD | Source: Ambulatory Visit

## 2016-12-31 NOTE — Telephone Encounter (Signed)
Patient's phone number disconnected and no longer in service.  I was able to get in touch with daughter, Tanya Hardin. Daughter states she was just able to get a hold of her mother this morning via Facebook and she will see her after 2pm today and have her call us. Patient does not have a working phone at this time. I stressed the importance of rescheduling myoview before ablation which is next week. She verbalized understanding.

## 2017-01-01 NOTE — Telephone Encounter (Signed)
S/w patient on daughter's cell phone number. Patient reschedule lexiscan for Tuesday, 01/05/17 at 07:30 am. We reviewed pre-procedural instructions as noted on AVS and she verbalized understanding.  We also reviewed instructions for SVT ablation on 01/06/17. I reprinted letter and corrected the time and date of arrival. Left it at front desk for patient to pick up. She verbalized understanding.

## 2017-01-01 NOTE — Telephone Encounter (Signed)
Pt retuning our calls  Please call back

## 2017-01-04 ENCOUNTER — Telehealth: Payer: Self-pay | Admitting: Cardiovascular Disease

## 2017-01-04 NOTE — Telephone Encounter (Signed)
I spoke with the patient.  She states she is calling to cancel both her stress test and SVT ablation for this week due to fever, congestion, and no transportation for her procedure on Wednesday.   I advised the patient to call back when she is feeling better and we will reschedule her procedures. She states she will call back after Christmas.   EP lab Clydie Braun(Karen) notified of cancellation for Wednesday. I [facility] that patient will be cancelling for tomorrow.

## 2017-01-04 NOTE — Telephone Encounter (Signed)
Spoke with the patient's daughter- patient has gone to the store.  She states she will be leaving with her phone to go get her kids from school, but she will have the patient call back- it may be after 4 pm. I advised that is fine, just tell the patient to have the person answering the phone let us know when she calls back as she is hard to reach.

## 2017-01-04 NOTE — Telephone Encounter (Signed)
Patient has a few questions regarding stress test Patient may also need to reschedule ablation due to transportation issues Please call to discuss

## 2017-01-04 NOTE — Telephone Encounter (Signed)
Attempted to call the patient at her home number- message that this number has been "changed, disconnected, or no longer in service."

## 2017-01-06 ENCOUNTER — Ambulatory Visit (HOSPITAL_COMMUNITY): Admit: 2017-01-06 | Payer: BLUE CROSS/BLUE SHIELD | Admitting: Internal Medicine

## 2017-01-06 ENCOUNTER — Encounter (HOSPITAL_COMMUNITY): Payer: Self-pay

## 2017-01-06 SURGERY — SVT ABLATION

## 2017-01-25 ENCOUNTER — Telehealth: Payer: Self-pay | Admitting: Cardiovascular Disease

## 2017-01-25 ENCOUNTER — Other Ambulatory Visit: Payer: Self-pay

## 2017-01-25 NOTE — Telephone Encounter (Signed)
Pt called to cancel tomorrow's lexi myoview stating family members had other doctor appts. Pt has cancelled/no show/cancelled due to smoking 4 times. She states she does want to have testing and ablation.  Have rescheduled for 02/02/17 and moved appt w/Dr. Graciela HusbandsKlein to February. Pt verbalized understanding.  Notified Abby in Nuc Med

## 2017-01-25 NOTE — Telephone Encounter (Signed)
Pt would like to cancel her stress test appt for tomorrow.

## 2017-01-28 ENCOUNTER — Ambulatory Visit: Payer: BLUE CROSS/BLUE SHIELD | Admitting: Internal Medicine

## 2017-02-02 ENCOUNTER — Encounter: Admission: RE | Admit: 2017-02-02 | Payer: BLUE CROSS/BLUE SHIELD | Source: Ambulatory Visit

## 2017-02-02 ENCOUNTER — Telehealth: Payer: Self-pay

## 2017-02-02 NOTE — Telephone Encounter (Signed)
Received call from Abby, Nuclear Medicine. Pt did not show again today for testing, making this 6 times she has no showed/cancelled. Routed to Sherri RadHeather McGhee, RN, to make aware.

## 2017-02-02 NOTE — Telephone Encounter (Signed)
Noted- will leave follow up scheduled with Dr. Graciela HusbandsKlein for now- will forward to him to review as he is currently out of the office.    Dr. Graciela HusbandsKlein,  Patient has no showed/ r/s her lexiscan 6 times  She has cancelled her ablation with Dr. Ladona Ridgelaylor x 1 When I spoke with her, she sounded uncertain about doing either test. I will not r/s either at this time. She is scheduled to follow up with you on 02/25/17- do you want to keep and see if she shows to discuss further with her/ just cancel her appointment for follow up?

## 2017-02-10 NOTE — Telephone Encounter (Signed)
Sounds good THX !!!

## 2017-02-10 NOTE — Telephone Encounter (Signed)
Will keep follow up with Dr. Graciela HusbandsKlein on 02/25/17 to see if she shows for this appointment.

## 2017-02-25 ENCOUNTER — Ambulatory Visit: Payer: BLUE CROSS/BLUE SHIELD | Admitting: Internal Medicine

## 2017-02-26 ENCOUNTER — Encounter: Payer: Self-pay | Admitting: Internal Medicine

## 2017-02-26 NOTE — Telephone Encounter (Signed)
Patient no show for ov also

## 2018-05-26 ENCOUNTER — Telehealth: Payer: Self-pay | Admitting: Internal Medicine

## 2018-05-26 NOTE — Telephone Encounter (Signed)
Returned call to patient. She reports tachycardia that has increased in intensity and frequency over the past 2 weeks. Hx of SVT, saw Graciela Husbands last in 11/2016. Was due to have Stress test prior to ablation but "couldn't quit smoking for 24 hours, long enough to get tests done". Pt "holds nose and blows" to break tachycardia. Usually has it 1-2 times daily, usually when being active. She is a CNA in long term care. "Fast heart beat happens when giving patients a bath or shower".   Pt reports worsening SOB and fear that "something is seriously wrong with heart".  Pt reports heavy smoking, up to a pack per day. Pt tearful, "im ready to do what I need to do to take care of myself. In the past I havent done the right things for my health, but I am ready to quit". Pt speaks in full sentences, no audible distress noted.  Pt had appt with PCP this week but was cancelled d/t c/o SOB. They referred her for CV 19 testing, which came back negative. She is not able to go back until next Friday.   I asked for readings of VS and pt did not have any. I encouraged her to buy a BP cuff. Pt agreed.   I made appt for e visit with Eula Listen, PA on wed.   Consented verbally for e visit. Pt agreed.  Call routed to provider to further advise.

## 2018-05-26 NOTE — Telephone Encounter (Signed)
Pt states she is having tachycardia almost daily, states she gets weak when this happens. Please call to discuss.

## 2018-05-26 NOTE — Telephone Encounter (Signed)
Agree with plan for scheduling evisit with Eula Listen, PA-C.    If patient is agreeable, I would also recommend we mail her a 14 day Zio monitor as well for further information regarding her heart rate and rhythm.

## 2018-05-27 MED ORDER — NICOTINE 14 MG/24HR TD PT24: 14.0000 mg | MEDICATED_PATCH | Freq: Every day | TRANSDERMAL | 1 refills | Status: DC

## 2018-05-27 NOTE — Telephone Encounter (Signed)
Returned call to patient.   Order placed for nicotine patches.   Pt reports that she spoke with [employer] and they have an affordable plan that she will likely be able to get. It would not start until 6/1.  Pt wanting to hold off on monitor until she confirms insurance. She will call us back when she does.   Confirmed e visit for Wednesday with Eula Listen, PA.    Advised pt to call for any further questions or concerns.

## 2018-05-27 NOTE — Telephone Encounter (Signed)
Agree w/ other recs - evisit and zio.  Ok to send in nicotine patch 14mg  daily for now - would give 30 days and 1 refill.

## 2018-05-27 NOTE — Telephone Encounter (Signed)
Incoming call from patient. I told her I was still working on request for nicotine Rx.   She verbalized understanding.

## 2018-05-30 NOTE — Progress Notes (Signed)
Virtual Visit via Video Note   This visit type was conducted due to national recommendations for restrictions regarding the COVID-19 Pandemic (e.g. social distancing) in an effort to limit this patient's exposure and mitigate transmission in our community.  Due to her co-morbid illnesses, this patient is at least at moderate risk for complications without adequate follow up.  This format is felt to be most appropriate for this patient at this time.  All issues noted in this document were discussed and addressed.  A limited physical exam was performed with this format.  Please refer to the patient's chart for her consent to telehealth for [facility].   Date:  06/01/2018   ID:  Tanya Durieindy L Hardin, DOB [birth year], MRN [id number]  Patient Location: Home Provider Location: Home  PCP:  Patient, No Pcp Per  Cardiologist:  Lorine BearsMuhammad Arida, MD  Electrophysiologist:  Sherryl MangesSteven Klein, MD   Evaluation Performed:  Follow-Up Visit  Chief Complaint:  Palpitations   History of Present Illness:    Tanya DurieCindy L Hardin is a 53 y.o. female with history of adenosine sensitive paroxysmal SVT, noncompliance, and ongoing tobacco abuse who presents for follow up of palpitations/SVT.  Her history of SVT dates back to around 2015 or 2016. She has been seen in the ED multiple times. Echo from 02/2015 showed an EF of 60-65%, normal wall motion, normal diastolic function. She was referred to EP for further evaluation with recommendation to proceed with catheter ablation. She did not want to take medications. She has cancelled SVT catheter ablation x 2. Nuclear stress testing (preprocedural given risk factors) has also been advised and has been either cancelled or a no show x 6. Historically, she has not been on beta blocker or calcium channel blocker as she has indicated she did not want to take pills.   She called in early 05/2018 noting almost daily tachy-palpitations. She indicated she was "ready to do what I need to do to  take care of myself." She was also interested in nicotine patches. Zio monitor was advised, though she preferred to hold off on this until she acquired insurance.   Labs: 11/2016 - HGB 13.2, K+ 4.6, SCr 0.68 01/2015 - AST 14, ALT 14, TSH normal  She notes a worsening of tachypalpitatons consistent with her prior SVT that dates back at least to the beginning of 2002, and possibly into 2019. She noted approximately 3-4 weeks prior she was having tachypalpitations on a daily basis. She noted these would occur when she would exert herself. She was able to break the episodes by pinching her nose and blowing out. This is typically how she has broken her episodes of SVT in the past. More recently, the episodes have been less frequent, occurring 2-3 times per week and still able to be broken as above. She was recently started on an albuterol inhaler on 05/26/2018 though her worsening of palpitations predates this. She denies any chest pain, dizziness, presyncope, or syncope. She continues to smoke, though is down from 2 packs daily to 10-15 cigarettes daily. She is using nicotine lozenges to help with tapering. She states "I have not taken care of myself in the past, but I am ready now." She denies any recent vomiting or diarrhea. She drinks one cup of coffee 2-3 times per week and about 4 twelve ounce Cokes daily. She denies any lower extremity swelling, abdominal distension, orthopnea, PND, or early satiety. She last had tachypalpitations 2 days ago. She has taken herself out of work given  her concern of COVID-19 and in the setting of frequent palpitations while exerting herself at work.   The patient does not have symptoms concerning for COVID-19 infection (fever, chills, cough, or new shortness of breath).   COVID-19 testing negative 05/25/2018.  Past Medical History:  Diagnosis Date  . SVT (supraventricular tachycardia) (HCC)    Past Surgical History:  Procedure Laterality Date  . CESAREAN SECTION        Current Meds  Medication Sig  . albuterol (VENTOLIN HFA) 108 (90 Base) MCG/ACT inhaler Inhale 2 puffs into the lungs as directed.  . nicotine (NICODERM CQ - DOSED IN MG/24 HOURS) 14 mg/24hr patch Place 1 patch (14 mg total) onto the skin daily. (Patient taking differently: Place 14 mg onto the skin daily. Not started yet)  . nicotine polacrilex (COMMIT) 4 MG lozenge Take 4 mg by mouth as needed for smoking cessation.     Allergies:   Codeine   Social History   Tobacco Use  . Smoking status: Current Every Day Smoker    Packs/day: 1.50    Years: 30.00    Pack years: 45.00    Types: Cigarettes  . Smokeless tobacco: Never Used  Substance Use Topics  . Alcohol use: Yes  . Drug use: Not on file     Family Hx: The patient's Family history is unknown by patient.  ROS:   Please see the history of present illness.     All other systems reviewed and are negative.   Prior CV studies:   The following studies were reviewed today:  2D Echo 02/2015: - Left ventricle: The cavity size was normal. Wall thickness was normal. Systolic function was normal. The estimated ejection fraction was in the range of 60% to 65%. Wall motion was normal; there were no regional wall motion abnormalities. Left ventricular diastolic function parameters were normal.  Impressions:  - Normal study. ___________  Labs/Other Tests and Data Reviewed:    EKG:  An ECG dated 12/17/2016 was personally reviewed today and demonstrated:  NSR, 83 bpm, no acute st/t changes   Recent Labs: No results found for requested labs within last 8760 hours.   Recent Lipid Panel No results found for: CHOL, TRIG, HDL, CHOLHDL, LDLCALC, LDLDIRECT  Wt Readings from Last 3 Encounters:  06/01/18 194 lb (88 kg)  12/17/16 188 lb 4 oz (85.4 kg)  03/26/16 184 lb 12 oz (83.8 kg)     Objective:    Vital Signs:  BP (!) 133/94 (BP Location: Left Arm, Patient Position: Sitting)   Pulse 89   Ht 5\' 2"  (1.575 m)   Wt 194 lb (88  kg) Comment: weight taken "last week"  LMP 06/27/2014   BMI 35.48 kg/m    VITAL SIGNS:  reviewed GEN:  no acute distress EYES:  sclerae anicteric, EOMI - Extraocular Movements Intact  ASSESSMENT & PLAN:    1. Paroxysmal SVT: Longstanding issue that has been more frequent as of late. She has previously been scheduled for SVGT ablation x 2 though cancelled. She is now open to medical therapy and reconsideration of SVT ablation. I will start her on Lopressor 25 mg bid and schedule a Lexiscan Myoview to evaluate for high risk ischemia given her SOB and with her coronary risk factors as tachycardia will be induced if she does have an ablation. I have asked her to decrease her caffeine intake. She declines Zio patch at this time as she is acquiring insurance and her symptoms are similar to prior known  SVT. Once her stress test is complete, I will reach out to EP and see if they would like to see her for consideration of SVT ablation. I will check a CBC, BMET, magnesium, and TSH on the day she presents for her Myoview.    2. HTN: Blood pressure Blood pressure has been on the higher side lately. She is quite anxious regarding COVID-19 and wonders if this is playing a role. Start Lopressor as above. Monitor.   3. SOB: She has recently been started on an albuterol inhaler as above. She is uncertain if she has COPD. She is seeing her PCP later this week to discuss this. She may benefit from a referral to pulmonology. Schedule Lexiscan Myoview (no treadmilling with COVID-19) given SOB and preprocedure for possible SVT ablation given her coronary risk factors).   4. Obesity: Weight loss advised.   5. Tobacco abuse: Complete cessation is advised. She is currently usage nicotine lozenges and has tapered from 2 packs daily to 10-15 cigarettes daily. She has been advised she will need to be completely nicotine free for 24 hours prior to stress testing (including all replacement products).   6. Noncompliance: She  indicates she will follow through with recommended plan.   COVID-19 Education: The signs and symptoms of COVID-19 were discussed with the patient and how to seek care for testing (follow up with PCP or arrange E-visit).  The importance of social distancing was discussed today.  Time:   Today, I have spent 20 minutes with the patient with telehealth technology discussing the above problems.     Medication Adjustments/Labs and Tests Ordered: Current medicines are reviewed at length with the patient today.  Concerns regarding medicines are outlined above.   Tests Ordered: No orders of the defined types were placed in this encounter.   Medication Changes: No orders of the defined types were placed in this encounter.   Disposition:  Follow up in 2 month(s)  Signed, Eula Listen, PA-C  06/01/2018 9:59 AM    [facility]

## 2018-06-01 ENCOUNTER — Encounter: Payer: Self-pay | Admitting: Physician Assistant

## 2018-06-01 ENCOUNTER — Other Ambulatory Visit: Payer: Self-pay

## 2018-06-01 ENCOUNTER — Telehealth (INDEPENDENT_AMBULATORY_CARE_PROVIDER_SITE_OTHER): Payer: Self-pay | Admitting: Physician Assistant

## 2018-06-01 ENCOUNTER — Telehealth: Payer: Self-pay | Admitting: Cardiovascular Disease

## 2018-06-01 VITALS — BP 133/94 | HR 89 | Ht 62.0 in | Wt 194.0 lb

## 2018-06-01 DIAGNOSIS — I10 Essential (primary) hypertension: Secondary | ICD-10-CM

## 2018-06-01 DIAGNOSIS — Z01810 Encounter for preprocedural cardiovascular examination: Secondary | ICD-10-CM

## 2018-06-01 DIAGNOSIS — R0602 Shortness of breath: Secondary | ICD-10-CM

## 2018-06-01 DIAGNOSIS — I471 Supraventricular tachycardia: Secondary | ICD-10-CM

## 2018-06-01 DIAGNOSIS — Z720 Tobacco use: Secondary | ICD-10-CM

## 2018-06-01 MED ORDER — METOPROLOL TARTRATE 25 MG PO TABS: 25.0000 mg | ORAL_TABLET | Freq: Two times a day (BID) | ORAL | 3 refills | Status: DC

## 2018-06-01 NOTE — Patient Instructions (Addendum)
It was a pleasure to speak with you on the phone today! Thank you for allowing [place] to continue taking care of your [facility] needs during this time.   Feel free to call as needed for questions and concerns related to your cardiac needs.   Medication Instructions:  Your physician has recommended you make the following change in your medication:  1- START Lopressor 1 tablet (25 mg total) twice daily   If you need a refill on your cardiac medications before your next appointment, please call your pharmacy.   Lab work:  1-  On the day of your procedure, you are due to have lab work prior to testing. (CBC, BMET, Mag, TSH)  If you have labs (blood work) drawn today and your tests are completely normal, you will receive your results only by: Marland Kitchen MyChart Message (if you have MyChart) OR . A paper copy in the mail If you have any lab test that is abnormal or we need to change your treatment, we will call you to review the results.  Testing/Procedures: 1- [facility] MYOVIEW  Your caregiver has ordered a Stress Test with nuclear imaging. The purpose of this test is to evaluate the blood supply to your heart muscle. This procedure is referred to as a "Non-Invasive Stress Test." This is because other than having an IV started in your vein, nothing is inserted or "invades" your body. Cardiac stress tests are done to find areas of poor blood flow to the heart by determining the extent of coronary artery disease (CAD). Some patients exercise on a treadmill, which naturally increases the blood flow to your heart, while others who are  unable to walk on a treadmill due to physical limitations have a pharmacologic/chemical stress agent called Lexiscan . This medicine will mimic walking on a treadmill by temporarily increasing your coronary blood flow.   Please note: these test may take anywhere between 2-4 hours to complete  PLEASE REPORT TO [facility] MEDICAL MALL ENTRANCE  THE VOLUNTEERS AT THE FIRST DESK WILL DIRECT YOU  WHERE TO GO  Date of Procedure:________5/19/20_____________________________  Arrival Time for Procedure:____7:00AM__________________________  Instructions regarding medication:   __x__:  Hold betablocker(s) night before procedure and morning of procedure (Lopressor)  __x__:  Hold Nicotine patches and nicotine gum________  PLEASE NOTIFY THE OFFICE AT LEAST 24 HOURS IN ADVANCE IF YOU ARE UNABLE TO KEEP YOUR APPOINTMENT.  [phone] AND  PLEASE NOTIFY NUCLEAR MEDICINE AT [facility] AT LEAST 24 HOURS IN ADVANCE IF YOU ARE UNABLE TO KEEP YOUR APPOINTMENT. [phone]  How to prepare for your Myoview test:  1. Do not eat or drink after midnight 2. No caffeine for 24 hours prior to test 3. No smoking 24 hours prior to test. 4. Your medication may be taken with water.  If your doctor stopped a medication because of this test, do not take that medication. 5. Ladies, please do not wear dresses.  Skirts or pants are appropriate. Please wear a short sleeve shirt. 6. No perfume, cologne or lotion. 7. Wear comfortable walking shoes. No heels!   Follow-Up: At [facility], you and your health needs are our priority.  As part of our continuing mission to provide you with exceptional heart care, we have created designated Provider Care Teams.  These Care Teams include your primary Cardiologist (physician) and Advanced Practice Providers (APPs -  Physician Assistants and Nurse Practitioners) who all work together to provide you with the care you need, when you need it. You will need a follow  up appointment in 2 months. You may see Lorine BearsMuhammad Arida, MD or Eula Listenyan Dunn, PA-C.

## 2018-06-01 NOTE — Telephone Encounter (Signed)
Patient calling Patient had a visit with R Dunn today and he stated a nurse will be getting in contact with her Patient is assuming it is to go over medications Please call to discuss

## 2018-06-01 NOTE — Telephone Encounter (Signed)
Incoming call from patient. Reviewing upcoming test and guidelines for entering hospital. Pt verbalized understanding.  She provided fax number and contact name for work letter requested by provider. Fax number [phone], name Mel Almond.

## 2018-06-03 ENCOUNTER — Telehealth: Payer: Self-pay | Admitting: Cardiovascular Disease

## 2018-06-03 NOTE — Telephone Encounter (Signed)
Pt c/o medication issue:  1. Name of Medication: Lorazapam 0.5 po BID PRN   2. How are you currently taking this medication (dosage and times per day)?  3. Are you having a reaction (difficulty breathing--STAT)? No   4. What is your medication issue? Patient has nuc on Tuesday and wants to know about starting this new med she could use it today but wants accurate stress test please advise

## 2018-06-03 NOTE — Telephone Encounter (Signed)
Call to patient, informed her that Lorazepam is not contraindicated during myoview.   She verbalized understanding.   No further questions at this time.

## 2018-06-07 ENCOUNTER — Ambulatory Visit
Admission: RE | Admit: 2018-06-07 | Discharge: 2018-06-07 | Disposition: A | Discharge status: Home / Self Care | Payer: Self-pay | Source: Ambulatory Visit | Attending: Physician Assistant | Admitting: Physician Assistant

## 2018-06-07 ENCOUNTER — Other Ambulatory Visit: Payer: Self-pay

## 2018-06-07 ENCOUNTER — Other Ambulatory Visit
Admission: RE | Admit: 2018-06-07 | Discharge: 2018-06-07 | Disposition: A | Discharge status: Home / Self Care | Payer: Self-pay | Source: Home / Self Care | Attending: Physician Assistant | Admitting: Physician Assistant

## 2018-06-07 DIAGNOSIS — I471 Supraventricular tachycardia: Secondary | ICD-10-CM

## 2018-06-07 DIAGNOSIS — R0602 Shortness of breath: Secondary | ICD-10-CM | POA: Insufficient documentation

## 2018-06-07 LAB — BASIC METABOLIC PANEL
Anion gap: 7 (ref 5–15)
BUN: 12 mg/dL (ref 6–20)
CO2: 25 mmol/L (ref 22–32)
Calcium: 9.1 mg/dL (ref 8.9–10.3)
Chloride: 107 mmol/L (ref 98–111)
Creatinine, Ser: 0.7 mg/dL (ref 0.44–1.00)
GFR calc Af Amer: >60 mL/min (ref >60)
GFR calc non Af Amer: >60 mL/min (ref >60)
Glucose, Bld: 116 mg/dL — ABNORMAL HIGH (ref 70–99)
Potassium: 4.6 mmol/L (ref 3.5–5.1)
Sodium: 139 mmol/L (ref 135–145)

## 2018-06-07 LAB — CBC
HCT: 39.3 % (ref 36.0–46.0)
Hemoglobin: 13.4 g/dL (ref 12.0–15.0)
MCH: 30.1 pg (ref 26.0–34.0)
MCHC: 34.1 g/dL (ref 30.0–36.0)
MCV: 88.3 fL (ref 80.0–100.0)
Platelets: 225 10*3/uL (ref 150–400)
RBC: 4.45 MIL/uL (ref 3.87–5.11)
RDW: 12.3 % (ref 11.5–15.5)
WBC: 8.2 10*3/uL (ref 4.0–10.5)
nRBC: 0 % (ref 0.0–0.2)

## 2018-06-07 LAB — NM MYOCAR MULTI W/SPECT W/WALL MOTION / EF
Estimated workload: 1 METS
Exercise duration (min): 0 min
Exercise duration (sec): 0 s
LV dias vol: 75 mL (ref 46–106)
LV sys vol: 24 mL
MPHR: 167 {beats}/min
Peak HR: 120 {beats}/min
Percent HR: 71 %
Rest HR: 60 {beats}/min
TID: 1.13

## 2018-06-07 LAB — TSH: TSH: 3.525 u[IU]/mL (ref 0.350–4.500)

## 2018-06-07 LAB — MAGNESIUM: Magnesium: 2.2 mg/dL (ref 1.7–2.4)

## 2018-06-07 MED ORDER — REGADENOSON 0.4 MG/5ML IV SOLN
0.4000 mg | Freq: Once | INTRAVENOUS | Status: AC
  Administered 2018-06-07: 0.4 mg via INTRAVENOUS

## 2018-06-07 MED ORDER — TECHNETIUM TC 99M TETROFOSMIN IV KIT
32.0390 | PACK | Freq: Once | INTRAVENOUS | Status: AC | PRN
  Administered 2018-06-07: 32.039 via INTRAVENOUS

## 2018-06-07 MED ORDER — TECHNETIUM TC 99M TETROFOSMIN IV KIT
10.5360 | PACK | Freq: Once | INTRAVENOUS | Status: AC | PRN
  Administered 2018-06-07: 08:00:00 10.536 via INTRAVENOUS

## 2018-06-16 ENCOUNTER — Telehealth: Payer: Self-pay | Admitting: Internal Medicine

## 2018-06-16 NOTE — Telephone Encounter (Signed)
Notes recorded by Duke Salvia, MD on 06/14/2018 at 12:33 PM EDT H Please Inform Patient Myoview was normal If she is interested in proceeding w RFCA then plz set of telehealth visit wGT If not interested in ablation but flecainide I would be gald to see her  Thanks

## 2018-06-16 NOTE — Telephone Encounter (Signed)
I spoke with the patient regarding her stress test and Dr. Odessa Hardin recommendations regarding her SVT.   Per the patient, she would prefer to see Dr. Graciela Hardin to review medical therapy vs ablation.  She states she has been exposed to COIVD-19 and is under quarantine for 14 days.  I have advised our first opening with Dr. Graciela Hardin is on 07/28/18 and she is ok with this date. She has been scheduled for 11:00 on 07/28/18 with Dr. Graciela Hardin.

## 2018-06-16 NOTE — Telephone Encounter (Signed)
Notes recorded by Sondra Barges, PA-C on 06/07/2018 at 6:38 PM EDT - Pre-procedure stress test showed normal pump function and no significant ischemia.  - Tanya Hardin, this patient has been seen by Dr. Graciela Husbands for consideration of SVT ablation, though previously did not follow through with his recommended stress test (now done as above) and SVT ablation. She now indicates she would like to follow through. Can you check with Dr. Graciela Husbands to see if he would like to see her in virtual world prior to setting up ablation. Thanks.

## 2018-07-20 ENCOUNTER — Telehealth: Payer: Self-pay | Admitting: Internal Medicine

## 2018-07-20 NOTE — Progress Notes (Deleted)
Electrophysiology TeleHealth Note   Due to national recommendations of social distancing due to COVID 19, an audio/video telehealth visit is felt to be most appropriate for this patient at this time.  See MyChart message from today for the patient's consent to telehealth for [facility].   Date:  07/20/2018   ID:  Tanya Hardin, DOB 1965/10/03, MRN [id number]  Location: patient's home  Provider location: [address], [place]  Evaluation Performed: Follow-up visit  PCP:  Patient, No Pcp Per  Cardiologist:   *** Electrophysiologist:  SK   Chief Complaint:  ***  History of Present Illness:    Tanya Hardin is a 53 y.o. female who presents via audio/video conferencing for a telehealth visit today.  Since last being seen in our clinic forSVT  the patient reports having declined for a second time to proceed with catheter ablation  Saw RD 5/20 with nearly daily episodes, and was agreeable to initiation of BB.    Myoview 5.20 normal Echo 2.17 normal     The patient denies symptoms of fevers, chills, cough, or new SOB worrisome for COVID 19. ***  Past Medical History:  Diagnosis Date  . SVT (supraventricular tachycardia) (HCC)     Past Surgical History:  Procedure Laterality Date  . CESAREAN SECTION      Current Outpatient Medications  Medication Sig Dispense Refill  . albuterol (VENTOLIN HFA) 108 (90 Base) MCG/ACT inhaler Inhale 2 puffs into the lungs as directed.    . metoprolol tartrate (LOPRESSOR) 25 MG tablet Take 1 tablet (25 mg total) by mouth 2 (two) times daily. 180 tablet 3  . nicotine (NICODERM CQ - DOSED IN MG/24 HOURS) 14 mg/24hr patch Place 1 patch (14 mg total) onto the skin daily. (Patient taking differently: Place 14 mg onto the skin daily. Not started yet) 30 patch 1  . nicotine polacrilex (COMMIT) 4 MG lozenge Take 4 mg by mouth as needed for smoking cessation.     No current facility-administered medications for this visit.      Allergies:   Codeine   Social History:  The patient  reports that she has been smoking cigarettes. She has a 45.00 pack-year smoking history. She has never used smokeless tobacco. She reports current alcohol use.   Family History:  The patient's   Family history is unknown by patient.   ROS:  Please see the history of present illness.   All other systems are personally reviewed and negative.    Exam:    Vital Signs:  LMP 06/27/2014  ***  Well appearing, alert and conversant, regular work of breathing,  good skin color Eyes- anicteric, neuro- grossly intact, skin- no apparent rash or lesions or cyanosis, mouth- oral mucosa is pink   Labs/Other Tests and Data Reviewed:    Recent Labs: 06/07/2018: BUN 12; Creatinine, Ser 0.70; Hemoglobin 13.4; Magnesium 2.2; Platelets 225; Potassium 4.6; Sodium 139; TSH 3.525   Wt Readings from Last 3 Encounters:  06/01/18 194 lb (88 kg)  12/17/16 188 lb 4 oz (85.4 kg)  03/26/16 184 lb 12 oz (83.8 kg)     Other studies personally reviewed: Additional studies/ records that were reviewed today include: ***  Review of the above records today demonstrates: *** Prior radiographs: ***    ASSESSMENT & PLAN:    SVT  *** COVID 19 screen The patient denies symptoms of COVID 19 at this time.  The importance of social distancing was discussed today.  Follow-up:  ***  Current medicines are reviewed at length with the patient today.   The patient {ACTIONS; HAS/DOES NOT HAVE:19233} concerns regarding her medicines.  The following changes were made today:  {NONE DEFAULTED:18576::"none"}  Labs/ tests ordered today include: *** No orders of the defined types were placed in this encounter.   Future tests ( post COVID )  *** in ***  months  Patient Risk:  after full review of this patients clinical status, I feel that they are at moderate*** risk at this time.  Today, I have spent *** minutes with the patient with telehealth technology discussing  the above.  Signed, Sherryl MangesSteven Jahmia Berrett, MD  07/20/2018 9:46 PM     [facility] [address] [place] [address] [phone] (office) [phone] (fax)

## 2018-07-20 NOTE — Telephone Encounter (Signed)
Virtual Visit Pre-Appointment Phone Call  "(Name), I am calling you today to discuss your upcoming appointment. We are currently trying to limit exposure to the virus that causes COVID-19 by seeing patients at home rather than in the office."  1. "What is the BEST phone number to call the day of the visit?" - include this in appointment notes  2. Do you have or have access to (through a family member/friend) a smartphone with video capability that we can use for your visit?" a. If yes - list this number in appt notes as cell (if different from BEST phone #) and list the appointment type as a VIDEO visit in appointment notes b. If no - list the appointment type as a PHONE visit in appointment notes  3. Confirm consent - "In the setting of the current Covid19 crisis, you are scheduled for a (phone or video) visit with your provider on (date) at (time).  Just as we do with many in-office visits, in order for you to participate in this visit, we must obtain consent.  If you'd like, I can send this to your mychart (if signed up) or email for you to review.  Otherwise, I can obtain your verbal consent now.  All virtual visits are billed to your insurance company just like a normal visit would be.  By agreeing to a virtual visit, we'd like you to understand that the technology does not allow for your provider to perform an examination, and thus may limit your provider's ability to fully assess your condition. If your provider identifies any concerns that need to be evaluated in person, we will make arrangements to do so.  Finally, though the technology is pretty good, we cannot assure that it will always work on either your or our end, and in the setting of a video visit, we may have to convert it to a phone-only visit.  In either situation, we cannot ensure that we have a secure connection.  Are you willing to proceed?" STAFF: Did the patient verbally acknowledge consent to telehealth visit? Document  YES/NO here: YEs  4. Advise patient to be prepared - "Two hours prior to your appointment, go ahead and check your blood pressure, pulse, oxygen saturation, and your weight (if you have the equipment to check those) and write them all down. When your visit starts, your provider will ask you for this information. If you have an Apple Watch or Kardia device, please plan to have heart rate information ready on the day of your appointment. Please have a pen and paper handy nearby the day of the visit as well."  5. Give patient instructions for MyChart download to smartphone OR Doximity/Doxy.me as below if video visit (depending on what platform provider is using)  6. Inform patient they will receive a phone call 15 minutes prior to their appointment time (may be from unknown caller ID) so they should be prepared to answer    [name] has been deemed a candidate for a follow-up tele-health visit to limit community exposure during the Covid-19 pandemic. I spoke with the patient via phone to ensure availability of phone/video source, confirm preferred email & phone number, and discuss instructions and expectations.  I reminded Tanya Hardin to be prepared with any vital sign and/or heart rhythm information that could potentially be obtained via home monitoring, at the time of her visit. I reminded Tanya Hardin to expect a phone call prior to  her visit.  Deno Etienneaylor R Bumgarner 07/20/2018 12:50 PM   INSTRUCTIONS FOR DOWNLOADING THE MYCHART APP TO SMARTPHONE  - The patient must first make sure to have activated MyChart and know their login information - If Apple, go to [employer] Store and type in MyChart in the search bar and download the app. If Android, ask patient to go to [employer] Play Store and type in [place] in the search bar and download the app. The app is free but as with any other app downloads, their phone may require them to verify saved payment information or  Apple/Android password.  - The patient will need to then log into the app with their MyChart username and password, and select [facility] as their healthcare provider to link the account. When it is time for your visit, go to the MyChart app, find appointments, and click Begin Video Visit. Be sure to Select Allow for your device to access the Microphone and Camera for your visit. You will then be connected, and your provider will be with you shortly.  **If they have any issues connecting, or need assistance please contact MyChart service desk [phone])**  **If using a computer, in order to ensure the best quality for their visit they will need to use either of the following Internet Browsers: [employer] Edge, or Google Chrome**  IF USING DOXIMITY or DOXY.ME - The patient will receive a link just prior to their visit by text.     FULL LENGTH CONSENT FOR TELE-HEALTH VISIT   I hereby voluntarily request, consent and authorize [facility] and its employed or contracted physicians, physician assistants, nurse practitioners or other licensed health care professionals (the Practitioner), to provide me with telemedicine health care services (the Services") as deemed necessary by the treating Practitioner. I acknowledge and consent to receive the Services by the Practitioner via telemedicine. I understand that the telemedicine visit will involve communicating with the Practitioner through live audiovisual communication technology and the disclosure of certain medical information by electronic transmission. I acknowledge that I have been given the opportunity to request an in-person assessment or other available alternative prior to the telemedicine visit and am voluntarily participating in the telemedicine visit.  I understand that I have the right to withhold or withdraw my consent to the use of telemedicine in the course of my care at any time, without affecting my right to future care  or treatment, and that the Practitioner or I may terminate the telemedicine visit at any time. I understand that I have the right to inspect all information obtained and/or recorded in the course of the telemedicine visit and may receive copies of available information for a reasonable fee.  I understand that some of the potential risks of receiving the Services via telemedicine include:   Delay or interruption in medical evaluation due to technological equipment failure or disruption;  Information transmitted may not be sufficient (e.g. poor resolution of images) to allow for appropriate medical decision making by the Practitioner; and/or   In rare instances, security protocols could fail, causing a breach of personal health information.  Furthermore, I acknowledge that it is my responsibility to provide information about my medical history, conditions and care that is complete and accurate to the best of my ability. I acknowledge that Practitioner's advice, recommendations, and/or decision may be based on factors not within their control, such as incomplete or inaccurate data provided by me or distortions of diagnostic images or specimens that may result from electronic transmissions. I  understand that the practice of medicine is not an exact science and that Practitioner makes no warranties or guarantees regarding treatment outcomes. I acknowledge that I will receive a copy of this consent concurrently upon execution via email to the email address I last provided but may also request a printed copy by calling the office of [facility].    I understand that my insurance will be billed for this visit.   I have read or had this consent read to me.  I understand the contents of this consent, which adequately explains the benefits and risks of the Services being provided via telemedicine.   I have been provided ample opportunity to ask questions regarding this consent and the Services and have had  my questions answered to my satisfaction.  I give my informed consent for the services to be provided through the use of telemedicine in my medical care  By participating in this telemedicine visit I agree to the above.

## 2018-07-21 ENCOUNTER — Telehealth: Payer: Self-pay | Admitting: Internal Medicine

## 2018-07-21 ENCOUNTER — Other Ambulatory Visit: Payer: Self-pay

## 2018-07-28 ENCOUNTER — Ambulatory Visit: Payer: Self-pay | Admitting: Internal Medicine

## 2018-08-02 ENCOUNTER — Telehealth: Payer: Self-pay

## 2018-08-02 NOTE — Telephone Encounter (Addendum)
    The patient's daughter was exposed to a co-worker who tested positive for the Covid 19 but has no symptoms and has been in quarantine for the past day. Tanya Hardin has been around the daughter since she was exposed. The patient has complaints of tachycardia, chest pain and shortness of breath with over exertion. Please advise if patient should reschedule.    COVID-19 Pre-Screening Questions:  . In the past 7 to 10 days have you had a cough,  shortness of breath, headache, congestion, fever (100 or greater) body aches, chills, sore throat, or sudden loss of taste or sense of smell? No  . Have you been around anyone with known Covid 19. No . Have you been around anyone who is awaiting Covid 19 test results in the past 7 to 10 days? No . Have you been around anyone who has been exposed to Covid 19, or has mentioned symptoms of Covid 19 within the past 7 to 10 days? Yes  If you have any concerns/questions about symptoms patients report during screening (either on the phone or at threshold). Contact the provider seeing the patient or DOD for further guidance.  If neither are available contact a member of the leadership team.

## 2018-08-02 NOTE — Telephone Encounter (Signed)
Can we convert to a virtual appt? Pt should contact her pcp to report her symptoms and determine if a COVID test is needed.

## 2018-08-02 NOTE — Telephone Encounter (Signed)
Spoke with Ms. Tanya Hardin regarding her appointment for July 16 th with Dr. Fletcher Anon. Told her to contact her PCP to follow up for Covid 19 testing due to her possible exposure from her daughter who was exposed to a positive co-worker. The patient agreed and will follow up on this. The patient will contact our office for a follow up appointment once she has contact her PCP regarding the Covid 19 testing.

## 2018-08-02 NOTE — Telephone Encounter (Signed)
The symptoms are not new symptoms and are the reason she had the [facility] which is the reason for the follow up appointment.

## 2018-08-04 ENCOUNTER — Ambulatory Visit: Payer: Self-pay | Admitting: Cardiovascular Disease

## 2018-08-08 NOTE — Progress Notes (Signed)
Electrophysiology TeleHealth Note   Due to national recommendations of social distancing due to COVID 19, an audio/video telehealth visit is felt to be most appropriate for this patient at this time.  See MyChart message from today for the patient's consent to telehealth [facility].   Date:  08/09/2018   ID:  Tanya Durieindy L Hardin, DOB 1965-10-09, MRN [id number]  Location: patient's home  Provider location: [address], [place]  Evaluation Performed: Follow-up visit  PCP:  Patient, No Pcp Per  Cardiologist:   MA Electrophysiologist:  SK   Chief Complaint:  tachypalpitations  History of Present Illness:    Tanya Hardin is a 53 y.o. female who presents via audio/video conferencing for a telehealth visit today.  Since last being seen in our clinic forSVT  the patient reports having had recurrent SVT at the end of April.  Her major complaint now is exercise associated rapid heart rates in the 110 range.  She has significant dyspnea on exertion.  She has been seen in the past and has been referred for RFCA which she has canceled x2 most recently 2018   No edema  Continues to smoke heavily    DATE TEST EF   2/17 Echo  60-65%   5/20 MYOVIEW 65% No ischemia          The patient denies symptoms of fevers, chills, cough, or new SOB worrisome for COVID 19.    Past Medical History:  Diagnosis Date  . SVT (supraventricular tachycardia) (HCC)     Past Surgical History:  Procedure Laterality Date  . CESAREAN SECTION      Current Outpatient Medications  Medication Sig Dispense Refill  . albuterol (VENTOLIN HFA) 108 (90 Base) MCG/ACT inhaler Inhale 2 puffs into the lungs as directed.    Marland Kitchen. FLUoxetine (PROZAC) 10 MG tablet Take by mouth.    . metoprolol tartrate (LOPRESSOR) 25 MG tablet Take 1 tablet (25 mg total) by mouth 2 (two) times daily. 60 tablet 11  . nicotine polacrilex (COMMIT) 4 MG lozenge Take 4 mg by mouth as needed for smoking cessation.    .  nicotine (NICODERM CQ - DOSED IN MG/24 HOURS) 21 mg/24hr patch Place 1 patch (21 mg total) onto the skin daily. 28 patch 0   No current facility-administered medications for this visit.     Allergies:   Codeine   Social History:  The patient  reports that she has been smoking cigarettes. She has a 45.00 pack-year smoking history. She has never used smokeless tobacco. She reports current alcohol use.   Family History:  The patient's   Family history is unknown by patient.   ROS:  Please see the history of present illness.   All other systems are personally reviewed and negative.    Exam:    Vital Signs:  BP 136/78 (BP Location: Left Arm, Patient Position: Sitting, Cuff Size: Normal)   Pulse 94   Ht 5\' 2"  (1.575 m)   Wt 196 lb (88.9 kg)   LMP 06/27/2014   BMI 35.85 kg/m     Well appearing, alert and conversant, regular work of breathing,  good skin color Eyes- anicteric, neuro- grossly intact, skin- no apparent rash or lesions or cyanosis, mouth- oral mucosa is pink   Labs/Other Tests and Data Reviewed:    Recent Labs: 06/07/2018: BUN 12; Creatinine, Ser 0.70; Hemoglobin 13.4; Magnesium 2.2; Platelets 225; Potassium 4.6; Sodium 139; TSH 3.525   Wt Readings from Last  3 Encounters:  08/09/18 196 lb (88.9 kg)  06/01/18 194 lb (88 kg)  12/17/16 188 lb 4 oz (85.4 kg)     Other studies personally reviewed: Additional studies/ records that were reviewed today include: As above   Review of the above records today demonstrates:   Prior radiographs:      ASSESSMENT & PLAN:    SVT  Cig abuse  COPd   The patient has infrequent episodes of SVT as of now although she had a flurry of them a few months ago.  She is not ready at this point to pursue catheter ablation.  I have encouraged her to take her metoprolol on a regular basis.  This will help with her exercise tachycardia.  We also discussed cigarettes cessation and will prescribe for her nicotine patches.  Dyspnea is  undoubtedly related both to deconditioning, COPD, and perhaps aggravated by her exercise associated tachycardia   COVID 19 screen The patient denies symptoms of COVID 19 at this time.  The importance of social distancing was discussed today.  Follow-up: 66m     Current medicines are reviewed at length with the patient today.   The patient  concerns regarding her medicines.  The following changes were made today:  Begin metoprolol tartrate 25 bid  Nicotine patch  Labs/ tests ordered today include:  No orders of the defined types were placed in this encounter.   Future tests ( post COVID )    Patient Risk:  after full review of this patients clinical status, I feel that they are at moderate risk at this time.  Today, I have spent 25 minutes with the patient with telehealth technology discussing the above.  Signed, Virl Axe, MD  08/09/2018 12:28 PM     [place] [address] [place] [address] [phone] (office) [phone] (fax)

## 2018-08-09 ENCOUNTER — Telehealth (INDEPENDENT_AMBULATORY_CARE_PROVIDER_SITE_OTHER): Payer: BLUE CROSS/BLUE SHIELD | Admitting: Internal Medicine

## 2018-08-09 ENCOUNTER — Other Ambulatory Visit: Payer: Self-pay

## 2018-08-09 VITALS — BP 136/78 | HR 94 | Ht 62.0 in | Wt 196.0 lb

## 2018-08-09 DIAGNOSIS — I471 Supraventricular tachycardia: Secondary | ICD-10-CM

## 2018-08-09 DIAGNOSIS — R0602 Shortness of breath: Secondary | ICD-10-CM

## 2018-08-09 MED ORDER — METOPROLOL TARTRATE 25 MG PO TABS: 25.0000 mg | ORAL_TABLET | Freq: Two times a day (BID) | ORAL | 11 refills | Status: DC

## 2018-08-09 MED ORDER — NICOTINE 21 MG/24HR TD PT24: 21.0000 mg | MEDICATED_PATCH | Freq: Every day | TRANSDERMAL | 0 refills | Status: DC

## 2018-08-09 NOTE — Patient Instructions (Addendum)
Medication Instructions:  - Your physician has recommended you make the following change in your medication:   1) Start nicotine patches - 21 mg/ 24 hour: place 1 patch onto the skin daily x 6 weeks, then - 14 mg/ 24 hour : place 1 patch onto the skin daily x 2 weeks, then - 7 mg/ 24 hour: place 1 patch onto the skin daily x 2 weeks, then - stop  2) Re-start lopressor (metoprolol tartrate) 25 mg- take 1 tablet by mouth twice daily  If you need a refill on your cardiac medications before your next appointment, please call your pharmacy.   Lab work: - none ordered  If you have labs (blood work) drawn today and your tests are completely normal, you will receive your results only by: Marland Kitchen MyChart Message (if you have MyChart) OR . A paper copy in the mail If you have any lab test that is abnormal or we need to change your treatment, we will call you to review the results.  Testing/Procedures: - none ordered  Follow-Up: At [facility], you and your health needs are our priority.  As part of our continuing mission to provide you with exceptional heart care, we have created designated Provider Care Teams.  These Care Teams include your primary Cardiologist (physician) and Advanced Practice Providers (APPs -  Physician Assistants and Nurse Practitioners) who all work together to provide you with the care you need, when you need it. You will need a follow up appointment in 6 months (January) with Dr. Caryl Comes. Marland Kitchen Please call our office 2 months in advance to schedule this appointment.  (Call in early November to schedule)   Any Other Special Instructions Will Be Listed Below (If Applicable). - N/A

## 2018-09-12 ENCOUNTER — Other Ambulatory Visit: Payer: Self-pay | Admitting: *Deleted

## 2018-09-12 MED ORDER — NICOTINE 14 MG/24HR TD PT24: MEDICATED_PATCH | TRANSDERMAL | 0 refills | Status: DC

## 2018-09-20 ENCOUNTER — Telehealth: Payer: Self-pay | Admitting: Internal Medicine

## 2018-09-20 NOTE — Telephone Encounter (Signed)
Message not seen until now.  To Dr. Caryl Comes to review.

## 2018-09-20 NOTE — Telephone Encounter (Signed)
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

## 2018-09-22 NOTE — Telephone Encounter (Signed)
H tx sk   "I hope all is well I would have to defer to PCP regarding the implications of COPD with a COVID unit--it might be a safe place to be with the extreme protection of PPE  Hope this helps SK"

## 2018-09-22 NOTE — Telephone Encounter (Signed)
I spoke with the patient and advised her of Dr. Olin Pia recommendations with work.  She voiced understanding.

## 2018-10-13 ENCOUNTER — Other Ambulatory Visit: Payer: Self-pay | Admitting: *Deleted

## 2018-10-13 MED ORDER — NICOTINE 7 MG/24HR TD PT24: MEDICATED_PATCH | TRANSDERMAL | 0 refills | Status: DC

## 2018-10-14 ENCOUNTER — Other Ambulatory Visit: Payer: Self-pay

## 2018-10-21 ENCOUNTER — Other Ambulatory Visit: Payer: Self-pay

## 2019-01-10 ENCOUNTER — Ambulatory Visit: Payer: BLUE CROSS/BLUE SHIELD | Admitting: Family

## 2019-06-30 ENCOUNTER — Emergency Department: Payer: Self-pay

## 2019-06-30 ENCOUNTER — Emergency Department
Admission: EM | Admit: 2019-06-30 | Discharge: 2019-06-30 | Disposition: A | Discharge status: Home / Self Care | Payer: Self-pay | Attending: Emergency Medicine | Admitting: Emergency Medicine

## 2019-06-30 ENCOUNTER — Encounter: Payer: Self-pay | Admitting: Emergency Medicine

## 2019-06-30 ENCOUNTER — Other Ambulatory Visit: Payer: Self-pay

## 2019-06-30 DIAGNOSIS — F17210 Nicotine dependence, cigarettes, uncomplicated: Secondary | ICD-10-CM | POA: Insufficient documentation

## 2019-06-30 DIAGNOSIS — M4716 Other spondylosis with myelopathy, lumbar region: Secondary | ICD-10-CM | POA: Insufficient documentation

## 2019-06-30 DIAGNOSIS — Z79899 Other long term (current) drug therapy: Secondary | ICD-10-CM | POA: Insufficient documentation

## 2019-06-30 DIAGNOSIS — I471 Supraventricular tachycardia: Secondary | ICD-10-CM | POA: Insufficient documentation

## 2019-06-30 DIAGNOSIS — R110 Nausea: Secondary | ICD-10-CM | POA: Insufficient documentation

## 2019-06-30 LAB — COMPREHENSIVE METABOLIC PANEL
ALT: 22 U/L (ref 0–44)
AST: 20 U/L (ref 15–41)
Albumin: 3.7 g/dL (ref 3.5–5.0)
Alkaline Phosphatase: 64 U/L (ref 38–126)
Anion gap: 8 (ref 5–15)
BUN: 11 mg/dL (ref 6–20)
CO2: 23 mmol/L (ref 22–32)
Calcium: 9.1 mg/dL (ref 8.9–10.3)
Chloride: 104 mmol/L (ref 98–111)
Creatinine, Ser: 0.75 mg/dL (ref 0.44–1.00)
GFR calc Af Amer: >60 mL/min (ref >60)
GFR calc non Af Amer: >60 mL/min (ref >60)
Glucose, Bld: 144 mg/dL — ABNORMAL HIGH (ref 70–99)
Potassium: 4 mmol/L (ref 3.5–5.1)
Sodium: 135 mmol/L (ref 135–145)
Total Bilirubin: 0.8 mg/dL (ref 0.3–1.2)
Total Protein: 7.9 g/dL (ref 6.5–8.1)

## 2019-06-30 LAB — CBC
HCT: 40.8 % (ref 36.0–46.0)
Hemoglobin: 14.2 g/dL (ref 12.0–15.0)
MCH: 30.6 pg (ref 26.0–34.0)
MCHC: 34.8 g/dL (ref 30.0–36.0)
MCV: 87.9 fL (ref 80.0–100.0)
Platelets: 249 10*3/uL (ref 150–400)
RBC: 4.64 MIL/uL (ref 3.87–5.11)
RDW: 12.3 % (ref 11.5–15.5)
WBC: 8.7 10*3/uL (ref 4.0–10.5)
nRBC: 0 % (ref 0.0–0.2)

## 2019-06-30 LAB — URINALYSIS, COMPLETE (UACMP) WITH MICROSCOPIC
Bilirubin Urine: NEGATIVE
Glucose, UA: NEGATIVE mg/dL
Hgb urine dipstick: NEGATIVE
Ketones, ur: NEGATIVE mg/dL
Leukocytes,Ua: NEGATIVE
Nitrite: NEGATIVE
Protein, ur: NEGATIVE mg/dL
Specific Gravity, Urine: 1.004 — ABNORMAL LOW (ref 1.005–1.030)
pH: 6 (ref 5.0–8.0)

## 2019-06-30 MED ORDER — TRAMADOL HCL 50 MG PO TABS: 50.0000 mg | ORAL_TABLET | Freq: Four times a day (QID) | ORAL | 0 refills | Status: DC | PRN

## 2019-06-30 MED ORDER — KETOROLAC TROMETHAMINE 60 MG/2ML IM SOLN
60.0000 mg | Freq: Once | INTRAMUSCULAR | Status: AC
  Administered 2019-06-30: 60 mg via INTRAMUSCULAR
  Filled 2019-06-30: qty 2

## 2019-06-30 MED ORDER — KETOROLAC TROMETHAMINE 10 MG PO TABS
10.0000 mg | ORAL_TABLET | Freq: Four times a day (QID) | ORAL | 0 refills | Status: DC | PRN
Start: 2019-06-30 — End: 2019-07-18

## 2019-06-30 MED ORDER — CYCLOBENZAPRINE HCL 10 MG PO TABS: 10.0000 mg | ORAL_TABLET | Freq: Three times a day (TID) | ORAL | 0 refills | Status: DC | PRN

## 2019-06-30 NOTE — ED Notes (Signed)
NAD noted at time of D/C. Pt denies questions or concerns. Pt ambulatory to the lobby at this time.  

## 2019-06-30 NOTE — ED Provider Notes (Signed)
[facility] Emergency Department Provider Note   ____________________________________________   First MD Initiated Contact with Patient 06/30/19 1215     (approximate)  I have reviewed the triage vital signs and the nursing notes.   HISTORY  Chief Complaint Back Pain    [name] is a 54 y.o. female patient complain of nontraumatic back pain for 2 days.  Patient denies radicular component to the back pain.  Patient states mild nausea without vomiting.  Patient was concerned for kidney infection.  Patient denies radicular component to her back pain.  Patient has bladder bowel dysfunction.  Patient rates the pain as a 10/10.  Patient scribed pain is "achy".  No palliative measure for complaint.      Past Medical History:  Diagnosis Date  . SVT (supraventricular tachycardia) [facility])     Patient Active Problem List   Diagnosis Date Noted  . Paroxysmal supraventricular tachycardia ([place]) 02/21/2015  . Tobacco use 02/21/2015    Past Surgical History:  Procedure Laterality Date  . CESAREAN SECTION      Prior to Admission medications   Medication Sig Start Date End Date Taking? Authorizing Provider  albuterol (VENTOLIN HFA) 108 (90 Base) MCG/ACT inhaler Inhale 2 puffs into the lungs as directed. 05/25/18 08/09/18  [provider]  cyclobenzaprine (FLEXERIL) 10 MG tablet Take 1 tablet (10 mg total) by mouth 3 (three) times daily as needed. 06/30/19   Sable Feil, PA-C  FLUoxetine (PROZAC) 10 MG tablet Take by mouth. 08/08/18 11/06/18  [provider]  ketorolac (TORADOL) 10 MG tablet Take 1 tablet (10 mg total) by mouth every 6 (six) hours as needed. 06/30/19   Sable Feil, PA-C  metoprolol tartrate (LOPRESSOR) 25 MG tablet Take 1 tablet (25 mg total) by mouth 2 (two) times daily. 08/09/18 11/07/18  Deboraha Sprang, MD  nicotine (NICODERM CQ - DOSED IN MG/24 HR) 7 mg/24hr patch Place 1 patch (7 mg) onto the skin once daily x 2  weeks 10/13/18   Deboraha Sprang, MD  nicotine polacrilex (COMMIT) 4 MG lozenge Take 4 mg by mouth as needed for smoking cessation.    [provider]  traMADol (ULTRAM) 50 MG tablet Take 1 tablet (50 mg total) by mouth every 6 (six) hours as needed. 06/30/19 06/29/20  Sable Feil, PA-C    Allergies Codeine  Family History  Family history unknown: Yes    Social History Social History   Tobacco Use  . Smoking status: Current Every Day Smoker    Packs/day: 1.50    Years: 30.00    Pack years: 45.00    Types: Cigarettes  . Smokeless tobacco: Never Used  Substance Use Topics  . Alcohol use: Yes  . Drug use: Not on file    Review of Systems Constitutional: No fever/chills Eyes: No visual changes. ENT: No sore throat. Cardiovascular: Denies chest pain. Respiratory: Denies shortness of breath. Gastrointestinal: No abdominal pain.  No nausea, no vomiting.  No diarrhea.  No constipation. Genitourinary: Negative for dysuria. Musculoskeletal: Positive for back pain. Skin: Negative for rash. Neurological: Negative for headaches, focal weakness or numbness. Allergic/Immunilogical: Codeine ____________________________________________   PHYSICAL EXAM:  VITAL SIGNS: ED Triage Vitals  Enc Vitals Group     BP 06/30/19 1116 105/73     Pulse Rate 06/30/19 1116 79     Resp 06/30/19 1116 16     Temp 06/30/19 1116 98 F (36.7 C)     Temp Source 06/30/19  1116 Oral     SpO2 06/30/19 1116 97 %     Weight 06/30/19 1117 200 lb (90.7 kg)     Height 06/30/19 1117 5\' 2"  (1.575 m)     Head Circumference --      Peak Flow --      Pain Score 06/30/19 1116 10     Pain Loc --      Pain Edu? --      Excl. in GC? --    Constitutional: Alert and oriented. Well appearing and in no acute distress. Cardiovascular: Normal rate, regular rhythm. Grossly normal heart sounds.  Good peripheral circulation. Respiratory: Normal respiratory effort.  No retractions. Lungs  CTAB. Gastrointestinal: Soft and nontender. No distention. No abdominal bruits. No CVA tenderness. Genitourinary: Deferred Musculoskeletal no obvious lumbar deformity.  Patient has moderate guarding palpation L4-S1.  Patient decreased range of motion is all fields.  Patient negative straight leg test.  Neurologic:  Normal speech and language. No gross focal neurologic deficits are appreciated. No gait instability. Skin:  Skin is warm, dry and intact. No rash noted. Psychiatric: Mood and affect are normal. Speech and behavior are normal.  ____________________________________________   LABS (all labs ordered are listed, but only abnormal results are displayed)  Labs Reviewed  URINALYSIS, COMPLETE (UACMP) WITH MICROSCOPIC - Abnormal; Notable for the following components:      Result Value   Color, Urine STRAW (*)    APPearance CLEAR (*)    Specific Gravity, Urine 1.004 (*)    Bacteria, UA RARE (*)    All other components within normal limits  COMPREHENSIVE METABOLIC PANEL - Abnormal; Notable for the following components:   Glucose, Bld 144 (*)    All other components within normal limits  CBC   ____________________________________________  EKG   ____________________________________________  RADIOLOGY  ED MD interpretation:    Official radiology report(s): DG Lumbar Spine 2-3 Views  Result Date: 06/30/2019 CLINICAL DATA:  Acute nontraumatic back pain. Pain for 2 years with sudden exacerbation over the last 2 days. EXAM: LUMBAR SPINE - 2-3 VIEW COMPARISON:  None. FINDINGS: Five non rib-bearing lumbar type vertebral bodies are present. Vertebral body heights are maintained. No significant listhesis is present. Mild rightward curvature is centered at L3. Disc spaces are relatively preserved. There is some narrowing on the left at L3-4 and L4-5. There may be narrowing on the right at L5-S1. Atherosclerotic calcifications are present in the aorta. No aneurysm is evident. IMPRESSION: 1.  Mild rightward curvature centered at L3. 2. Mild degenerative changes in the lower lumbar spine as described. 3. No acute abnormality. Electronically Signed   By: 08/30/2019 M.D.   On: 06/30/2019 13:13    ____________________________________________   PROCEDURES  Procedure(s) performed (including Critical Care):  Procedures   ____________________________________________   INITIAL IMPRESSION / ASSESSMENT AND PLAN / ED COURSE  As part of my medical decision making, I reviewed the following data within the electronic MEDICAL RECORD NUMBER     Patient presents with low back pain for 2 days.  Discussed x-ray findings with patient consistent with mild arthritis.  Patient given discharge care instruction and advised take medication as directed.  Patient advised establish care with PCP.    MIRIANA GAERTNER was evaluated in Emergency Department on 06/30/2019 for the symptoms described in the history of present illness. She was evaluated in the context of the global COVID-19 pandemic, which necessitated consideration that the patient might be at risk for infection with the SARS-CoV-2 virus  that causes COVID-19. Institutional protocols and algorithms that pertain to the evaluation of patients at risk for COVID-19 are in a state of rapid change based on information released by regulatory bodies including the CDC and federal and state organizations. These policies and algorithms were followed during the patient's care in the ED.       ____________________________________________   FINAL CLINICAL IMPRESSION(S) / ED DIAGNOSES  Final diagnoses:  Osteoarthritis of lumbar spine with myelopathy     ED Discharge Orders         Ordered    traMADol (ULTRAM) 50 MG tablet  Every 6 hours PRN     Discontinue  Reprint     06/30/19 1328    cyclobenzaprine (FLEXERIL) 10 MG tablet  3 times daily PRN     Discontinue  Reprint     06/30/19 1328    ketorolac (TORADOL) 10 MG tablet  Every 6 hours PRN      Discontinue  Reprint     06/30/19 1328           Note:  This document was prepared using Dragon voice recognition software and may include unintentional dictation errors.    Joni Reining, PA-C 06/30/19 1330    Minna Antis, MD 06/30/19 1443

## 2019-06-30 NOTE — ED Triage Notes (Signed)
Says bilateral  Low back pain for 2 days.  Says she thinks it is a kidney infection.  Says had some nausea, but no vomiting.  No fever.

## 2019-07-18 ENCOUNTER — Ambulatory Visit
Admission: EM | Admit: 2019-07-18 | Discharge: 2019-07-18 | Disposition: A | Discharge status: Home / Self Care | Payer: Self-pay | Attending: Physician Assistant | Admitting: Physician Assistant

## 2019-07-18 ENCOUNTER — Encounter: Payer: Self-pay | Admitting: Emergency Medicine

## 2019-07-18 ENCOUNTER — Other Ambulatory Visit: Payer: Self-pay

## 2019-07-18 DIAGNOSIS — B86 Scabies: Secondary | ICD-10-CM

## 2019-07-18 MED ORDER — PERMETHRIN 5 % EX CREA
TOPICAL_CREAM | CUTANEOUS | 0 refills | Status: DC
Start: 2019-07-18 — End: 2019-09-21

## 2019-07-18 NOTE — ED Provider Notes (Signed)
[facility]    CSN: [id number] Arrival date & time: 07/18/19  1041      History   Chief Complaint Chief Complaint  Patient presents with  . Rash    [name] is a 54 y.o. female.   Patient is a 54 year old female who presents with chief complaint of 2 days of red itchy rash to her right forearm/hand and her right foot.  Patient states her foot symptoms began 2 days ago, particularly noted that night with itching that was keeping her up.  Patient reports symptoms on her right forearm and hand began yesterday.  She reports her daughter has been diagnosed with scabies.  Reports continued itching.  She states she is used tree tea oil for her symptoms.  She denies any new soaps, detergents, fabric softeners etc.  She states she does not know anyone in her family with any skin fungal infections.     Past Medical History:  Diagnosis Date  . SVT (supraventricular tachycardia) [facility])     Patient Active Problem List   Diagnosis Date Noted  . Paroxysmal supraventricular tachycardia ([facility]) 02/21/2015  . Tobacco use 02/21/2015    Past Surgical History:  Procedure Laterality Date  . CESAREAN SECTION      OB History   No obstetric history on file.      Home Medications    Prior to Admission medications   Medication Sig Start Date End Date Taking? Authorizing Provider  albuterol (VENTOLIN HFA) 108 (90 Base) MCG/ACT inhaler Inhale 2 puffs into the lungs as directed. 05/25/18 07/18/19 Yes [provider]  FLUoxetine (PROZAC) 10 MG tablet Take by mouth. 08/08/18 07/18/19 Yes [provider]  metoprolol tartrate (LOPRESSOR) 25 MG tablet Take 1 tablet (25 mg total) by mouth 2 (two) times daily. 08/09/18 07/18/19 Yes Duke Salvia, MD  permethrin (ELIMITE) 5 % cream Apply to affected area once 07/18/19   Candis Schatz, PA-C  traMADol (ULTRAM) 50 MG tablet Take 1 tablet (50 mg total) by mouth every 6 (six) hours as needed. 06/30/19 06/29/20  Joni Reining, PA-C    Family History Family History  Family history unknown: Yes    Social History Social History   Tobacco Use  . Smoking status: Current Every Day Smoker    Packs/day: 1.50    Years: 30.00    Pack years: 45.00    Types: Cigarettes  . Smokeless tobacco: Never Used  Vaping Use  . Vaping Use: Never used  Substance Use Topics  . Alcohol use: Yes  . Drug use: Not Currently     Allergies   Codeine   Review of Systems Review of Systems as noted above in HPI.  Other systems reviewed and found to be negative.   Physical Exam Triage Vital Signs ED Triage Vitals  Enc Vitals Group     BP 07/18/19 1057 102/62     Pulse Rate 07/18/19 1057 76     Resp 07/18/19 1057 18     Temp 07/18/19 1057 98 F (36.7 C)     Temp Source 07/18/19 1057 Oral     SpO2 07/18/19 1057 95 %     Weight 07/18/19 1054 199 lb 15.3 oz (90.7 kg)     Height 07/18/19 1054 5\' 2"  (1.575 m)     Head Circumference --      Peak Flow --      Pain Score 07/18/19 1054 0     Pain Loc --  Pain Edu? --      Excl. in GC? --    No data found.  Updated Vital Signs BP 102/62 (BP Location: Left Arm)   Pulse 76   Temp 98 F (36.7 C) (Oral)   Resp 18   Ht 5\' 2"  (1.575 m)   Wt 199 lb 15.3 oz (90.7 kg)   LMP 06/27/2014   SpO2 95%   BMI 36.57 kg/m    Physical Exam Constitutional:      Appearance: Normal appearance. She is not ill-appearing.  Pulmonary:     Effort: Pulmonary effort is normal. No respiratory distress.  Skin:    Comments: Erythematous papular rash to the right forearm.  Some excoriations.  Some erythema and scabbing to the hand as well.  Right foot with some erythematous rash around the ankle and between the toes with some excoriations there as well.  Images as below.  Neurological:     Mental Status: She is alert.            UC Treatments / Results  Labs (all labs ordered are listed, but only abnormal results are displayed) Labs Reviewed - No data to  display  EKG   Radiology No results found.  Procedures Procedures (including critical care time)  Medications Ordered in UC Medications - No data to display  Initial Impression / Assessment and Plan / UC Course  I have reviewed the triage vital signs and the nursing notes.  Pertinent labs & imaging results that were available during my care of the patient were reviewed by me and considered in my medical decision making (see chart for details).    Patient with itchy, erythematous rash with some excoriations noted between the toes and on her forearm.  Patient reports exposure to scabies at home.  We will going treat with topical cream.  Advised she can take over-the-counter allergy medications for itching.  Have her follow-up with her symptoms not improve or if they could worsen.  Final Clinical Impressions(s) / UC Diagnoses   Final diagnoses:  Scabies     Discharge Instructions     -Permethrin cream: Apply to all the affected areas.  Would leave in place for 8-14 hours and then wash off in the shower.  If someone else applies the medication for you they should be wearing gloves. -Can also take over-the-counter allergy medications like Claritin/Zyrtec/Allegra for itching. -Return to clinic if no improvement after 7-10 days for possible reapplication of medicine.    ED Prescriptions    Medication Sig Dispense Auth. Provider   permethrin (ELIMITE) 5 % cream Apply to affected area once 60 g 08/27/2014, PA-C     PDMP not reviewed this encounter.   Candis Schatz, PA-C 07/18/19 1131

## 2019-07-18 NOTE — Discharge Instructions (Signed)
-  Permethrin cream: Apply to all the affected areas.  Would leave in place for 8-14 hours and then wash off in the shower.  If someone else applies the medication for you they should be wearing gloves. -Can also take over-the-counter allergy medications like Claritin/Zyrtec/Allegra for itching. -Return to clinic if no improvement after 7-10 days for possible reapplication of medicine.

## 2019-07-18 NOTE — ED Triage Notes (Signed)
Pt c/o rash on her right arm, and between her toes on her right foot. Rash is red, itchy. Started about 2 days ago. She states her daughter recently had scabies.

## 2019-07-25 ENCOUNTER — Encounter: Payer: Self-pay | Admitting: Family Medicine

## 2019-07-25 ENCOUNTER — Ambulatory Visit: Payer: Self-pay | Admitting: Family Medicine

## 2019-07-25 ENCOUNTER — Other Ambulatory Visit: Payer: Self-pay

## 2019-07-25 DIAGNOSIS — Z2089 Contact with and (suspected) exposure to other communicable diseases: Secondary | ICD-10-CM

## 2019-07-25 DIAGNOSIS — Z113 Encounter for screening for infections with a predominantly sexual mode of transmission: Secondary | ICD-10-CM

## 2019-07-25 DIAGNOSIS — Z207 Contact with and (suspected) exposure to pediculosis, acariasis and other infestations: Secondary | ICD-10-CM

## 2019-07-25 MED ORDER — PERMETHRIN 5 % EX CREA: TOPICAL_CREAM | Freq: Once | CUTANEOUS | Status: AC

## 2019-07-25 NOTE — Progress Notes (Signed)
Dispensed Permethrin Cream as ordered by provider and instructions given for Scabies tx and cleaning Provider orders completed.

## 2019-07-25 NOTE — Progress Notes (Signed)
  [facility] Department STI clinic/screening visit  Subjective:  Tanya Hardin is a 54 y.o. female being seen today for an STI screening visit. The patient reports they do have symptoms.  Patient reports that they do not desire a pregnancy in the next year.   They reported they are not interested in discussing contraception today.  Patient's last menstrual period was 06/27/2014.   Patient has the following medical conditions:   Patient Active Problem List   Diagnosis Date Noted  . Paroxysmal supraventricular tachycardia ([facility]) 02/21/2015  . Tobacco use 02/21/2015    Chief Complaint  Patient presents with  . SEXUALLY TRANSMITTED DISEASE    wants Scabies exam    HPI  Patient reports that she has scabies.  States she was treated on 07/18/2019 @ urgent care with permethrin.  States that she continues to have pruritic, red lesions on legs/arms/trunk.  Client states that her daughter was treated and her 2 grandchildren were treated this week.  Client states that both houses were cleaned after treatment.  Last HIV test per patient/review of record was unknown Patient reports last pap was unknown.   See flowsheet for further details and programmatic requirements.    The following portions of the patient's history were reviewed and updated as appropriate: allergies, current medications, past medical history, past social history, past surgical history and problem list.  Objective:  There were no vitals filed for this visit.  Physical Exam Skin:    General: Skin is warm and dry.     Findings: Erythema and lesion present.     Comments: Small erythematous, papular lesions noted on legs, arms and hands.  Several papules have been scratched and more erythematous.  No discharge noted, lesions are nontender.    Assessment and Plan:  Tanya Hardin is a 54 y.o. female presenting to the [facility] Department for STI screening  1. Screening examination for venereal  disease Declines blood work, GC/chlamydia testing.  2. Scabies exposure Most likely client has been re-exposed to scabies from family.   Treat with Permethrin 5% cream apply as directed, bathe in 8-14 hours and recommend general cleaning of household.  If continues to have symptoms client will be evaluated by her PCP.    No follow-ups on file.  No future appointments.  Larene Pickett, FNP

## 2019-08-08 ENCOUNTER — Telehealth: Payer: Self-pay | Admitting: General Practice

## 2019-08-08 NOTE — Telephone Encounter (Signed)
Individual has been contacted 3+ times regarding ED referral. No further attempts to contact individual will be made. 

## 2019-08-14 ENCOUNTER — Other Ambulatory Visit: Payer: Self-pay | Admitting: Internal Medicine

## 2019-09-16 ENCOUNTER — Other Ambulatory Visit: Payer: Self-pay

## 2019-09-16 ENCOUNTER — Emergency Department
Admission: EM | Admit: 2019-09-16 | Discharge: 2019-09-16 | Disposition: A | Discharge status: Home / Self Care | Payer: Self-pay | Attending: Emergency Medicine | Admitting: Emergency Medicine

## 2019-09-16 ENCOUNTER — Emergency Department: Payer: Self-pay

## 2019-09-16 DIAGNOSIS — R0789 Other chest pain: Secondary | ICD-10-CM | POA: Insufficient documentation

## 2019-09-16 DIAGNOSIS — M545 Low back pain: Secondary | ICD-10-CM | POA: Insufficient documentation

## 2019-09-16 DIAGNOSIS — M25512 Pain in left shoulder: Secondary | ICD-10-CM | POA: Insufficient documentation

## 2019-09-16 DIAGNOSIS — Z5321 Procedure and treatment not carried out due to patient leaving prior to being seen by health care provider: Secondary | ICD-10-CM | POA: Insufficient documentation

## 2019-09-16 LAB — CBC
HCT: 37.9 % (ref 36.0–46.0)
Hemoglobin: 13.2 g/dL (ref 12.0–15.0)
MCH: 30.9 pg (ref 26.0–34.0)
MCHC: 34.8 g/dL (ref 30.0–36.0)
MCV: 88.8 fL (ref 80.0–100.0)
Platelets: 208 10*3/uL (ref 150–400)
RBC: 4.27 MIL/uL (ref 3.87–5.11)
RDW: 12.2 % (ref 11.5–15.5)
WBC: 9 10*3/uL (ref 4.0–10.5)
nRBC: 0 % (ref 0.0–0.2)

## 2019-09-16 LAB — BASIC METABOLIC PANEL
Anion gap: 9 (ref 5–15)
BUN: 13 mg/dL (ref 6–20)
CO2: 26 mmol/L (ref 22–32)
Calcium: 8.9 mg/dL (ref 8.9–10.3)
Chloride: 105 mmol/L (ref 98–111)
Creatinine, Ser: 0.74 mg/dL (ref 0.44–1.00)
GFR calc Af Amer: >60 mL/min (ref >60)
GFR calc non Af Amer: >60 mL/min (ref >60)
Glucose, Bld: 110 mg/dL — ABNORMAL HIGH (ref 70–99)
Potassium: 3.4 mmol/L — ABNORMAL LOW (ref 3.5–5.1)
Sodium: 140 mmol/L (ref 135–145)

## 2019-09-16 LAB — TROPONIN I (HIGH SENSITIVITY)
Troponin I (High Sensitivity): <2 ng/L (ref <18)
Troponin I (High Sensitivity): <2 ng/L (ref <18)

## 2019-09-16 MED ORDER — ACETAMINOPHEN 325 MG PO TABS
ORAL_TABLET | ORAL | Status: AC
  Filled 2019-09-16: qty 2

## 2019-09-16 MED ORDER — ACETAMINOPHEN 325 MG PO TABS
650.0000 mg | ORAL_TABLET | Freq: Once | ORAL | Status: AC
  Administered 2019-09-16: 650 mg via ORAL

## 2019-09-16 NOTE — ED Triage Notes (Signed)
Patient reports mid sternal chest pain that radiated through to her back and up into left shoulder.

## 2019-09-21 ENCOUNTER — Other Ambulatory Visit: Payer: Self-pay

## 2019-09-21 ENCOUNTER — Ambulatory Visit (INDEPENDENT_AMBULATORY_CARE_PROVIDER_SITE_OTHER): Payer: Self-pay | Admitting: Nurse Practitioner

## 2019-09-21 ENCOUNTER — Encounter: Payer: Self-pay | Admitting: Nurse Practitioner

## 2019-09-21 VITALS — BP 150/80 | HR 64 | Ht 62.0 in | Wt 197.2 lb

## 2019-09-21 DIAGNOSIS — K219 Gastro-esophageal reflux disease without esophagitis: Secondary | ICD-10-CM

## 2019-09-21 DIAGNOSIS — I471 Supraventricular tachycardia, unspecified: Secondary | ICD-10-CM

## 2019-09-21 DIAGNOSIS — I10 Essential (primary) hypertension: Secondary | ICD-10-CM

## 2019-09-21 MED ORDER — ALBUTEROL SULFATE HFA 108 (90 BASE) MCG/ACT IN AERS: 2.0000 | INHALATION_SPRAY | RESPIRATORY_TRACT | 3 refills | Status: DC

## 2019-09-21 NOTE — Progress Notes (Addendum)
Office Visit    Patient Name: Tanya Hardin Date of Encounter: 09/21/2019  Primary Care Provider:  Patient, No Pcp Per Primary Cardiologist:  Lorine Bears, MD  Chief Complaint    54 y/o ? w/ a h/o PSVT, chest pain (non-ischemic stress test 05/2018), and tobacco abuse, who presents for follow-up after recent ER evaluation for indigestion.  Past Medical History    Past Medical History:  Diagnosis Date  . Chest pain    a. 05/2018 MV: EF 55-65%, small, mild, fixed apical ant and apical defect, most likely artifact. No ischemia-->Low risk.  . Essential hypertension   . History of echocardiogram    a. 02/2015 Echo: Ef 60-65%. no rwma.  Marland Kitchen PSVT (paroxysmal supraventricular tachycardia) (HCC)   . Tobacco abuse    Past Surgical History:  Procedure Laterality Date  . CESAREAN SECTION      Allergies  Allergies  Allergen Reactions  . Codeine Itching, Nausea And Vomiting and Other (See Comments)    Sweaty, severe vomiting, room spinning.     History of Present Illness    54 year old female with the above past medical history including paroxysmal supraventricular tachycardia, chest pain, and tobacco abuse.  PSVT history dates back approximately for 5 years.  Prior echocardiogram in February 2017 showed normal LV function without regional wall motion abnormalities.  She has been managed with beta-blocker therapy, which she is currently taking daily, and has also been evaluated by electrophysiology.  She has not wanted ablation up to this point.  In May 2020, she was experiencing worsening dyspnea.  She underwent stress testing, which was low risk and nonischemic.  There was question of a small, mild, fixed apical anterior and apical defect which was felt to be most likely representative of artifact.  Since her last visit, she notes that she was doing reasonably well until about a week ago.  She is only had 1 brief episode of tachypalpitations in the past year or so.  She has been taking  metoprolol 25 mg daily instead of twice daily but on that particular occasion, she did take an extra dose.  Beginning late last week, she began experiencing almost nightly lower abdominal and epigastric discomfort associated with a burning sensation in her throat, nausea, and on one occasion vomiting.  Following the episode that included vomiting, she presented to the emergency department this past weekend where she had persistent epigastric discomfort for several hours.  ECG was nonacute and high-sensitivity troponin was normal at less than 2 x 2.  As she was triaged for nonurgent and was looking at a weight of greater than 6 hours, she left the emergency department.  She had recurrent symptoms when lying down in bed on Sunday and Monday night.  She called EMS on Tuesday night because of recurrent symptoms and following an ECG that was normal, she opted to stay home.  She has since been taking Pepcid AC and earlier today, started taking omeprazole after speaking with her parents.  She did not have any symptoms yesterday but notes that she also try to limit what she was eating.  She has not been experiencing any exertional chest pain, dyspnea, palpitations, PND, orthopnea, dizziness, syncope, edema, or early satiety.  She is still smoking 1 pack/day.  Home Medications    Prior to Admission medications   Medication Sig Start Date End Date Taking? Authorizing Provider  aspirin EC 81 MG tablet Take 81 mg by mouth daily. Swallow whole.   Yes [provider]  FLUoxetine (PROZAC) 10 MG tablet Take 10 mg by mouth daily.  08/08/18 09/20/28 Yes [provider]  metoprolol tartrate (LOPRESSOR) 25 MG tablet TAKE 1 TABLET BY MOUTH TWICE A DAY 08/15/19  Yes Duke Salvia, MD  omeprazole (PRILOSEC) 20 MG capsule Take 20 mg by mouth daily.   Yes [provider]  albuterol (VENTOLIN HFA) 108 (90 Base) MCG/ACT inhaler Inhale 2 puffs into the lungs as directed. 05/25/18 07/18/19  [provider]  permethrin (ELIMITE) 5 % cream Apply to affected area once 07/18/19   Candis Schatz, PA-C  traMADol (ULTRAM) 50 MG tablet Take 1 tablet (50 mg total) by mouth every 6 (six) hours as needed. 06/30/19 06/29/20  Joni Reining, PA-C    Review of Systems    Lower abdominal and epigastric pain associated with nausea and at least on one occasion, vomiting.  Symptoms seem to have improved slightly with the addition of an H2 blocker and PPI as well as watching what she is eating.  She denies exertional angina, dyspnea, palpitations, PND, orthopnea, dizziness, syncope, edema, or early satiety.  All other systems reviewed and are otherwise negative except as noted above.  Physical Exam    VS:  BP (!) 150/80 (BP Location: Left Arm, Patient Position: Sitting, Cuff Size: Large)   Pulse 64   Ht 5\' 2"  (1.575 m)   Wt 197 lb 4 oz (89.5 kg)   LMP 06/27/2014   SpO2 96%   BMI 36.08 kg/m  , BMI Body mass index is 36.08 kg/m. GEN: Well nourished, well developed, in no acute distress. HEENT: normal. Neck: Supple, no JVD, carotid bruits, or masses. Cardiac: RRR, 1/6 systolic murmur at the upper sternal border, no rubs, or gallops. No clubbing, cyanosis, edema.  Radials/PT 2+ and equal bilaterally.  Respiratory:  Respirations regular and unlabored, clear to auscultation bilaterally. GI: Soft, nontender, nondistended, BS + x 4. MS: no deformity or atrophy. Skin: warm and dry, no rash. Neuro:  Strength and sensation are intact. Psych: Normal affect.  Accessory Clinical Findings    ECG personally reviewed by me today -regular sinus rhythm, 64 - no acute changes.  Lab Results  Component Value Date   WBC 9.0 09/16/2019   HGB 13.2 09/16/2019   HCT 37.9 09/16/2019   MCV 88.8 09/16/2019   PLT 208 09/16/2019   Lab Results  Component Value Date   CREATININE 0.74 09/16/2019   BUN 13 09/16/2019   NA 140 09/16/2019   K 3.4 (L) 09/16/2019   CL 105 09/16/2019   CO2 26 09/16/2019   Lab Results   Component Value Date   ALT 22 06/30/2019   AST 20 06/30/2019   ALKPHOS 64 06/30/2019   BILITOT 0.8 06/30/2019    Assessment & Plan    1.  GERD/epigastric pain: Since late last week, patient has been having almost nightly abdominal and epigastric pain sometimes associated nausea and on one occasion, vomiting.  Symptoms last hours at a time and seem to be provoked by lying down in bed.  She had prolonged symptoms last Friday night prompting her to present to the emergency department where despite prolonged symptoms (greater than 6 to 8 hours), ECG was normal and troponins were normal x2.  She scheduled cardiology follow-up as she no longer has a primary care provider and was concerned that symptoms may be cardiac in origin.  We discussed symptoms at length today.  She has not been having any exertional chest pain or  dyspnea.  ECG is again normal in clinic today.  I offered reassurance that symptoms do not appear to be cardiac in origin, especially in light of prolonged symptoms with out objective evidence of ischemia when in the ED.  Symptoms have improved slightly with the addition of H2 blocker and PPI therapy.  I did recommend that she continue these medications.  She has not been having any melena or bright red blood per rectum.  H&H were normal in the ED.  I have asked her to follow-up with primary care and I have provided her information to arrange primary care through [facility].  2.  PSVT: Only one episode in the past year or so, which was relatively short-lived.  She has been taking metoprolol 25 mg just once a day instead of twice a day as prescribed.  I did recommend that she take this twice daily as prescribed, especially in light of elevated blood pressures.  3.  Essential hypertension: Blood pressure is 150/80.  She notes that she used to have low blood pressures but she has noticed recently that whenever her pressure is checked it is elevated.  As above, I have asked her to take  her metoprolol 25 mg twice daily as prescribed.  4.  Tobacco abuse: She continues to smoke 1 pack/day.  She feels helpless when it comes to trying to quit though says she is motivated.  We discussed using nicotine patches and setting a firm date for quitting.  She does not think she would be interested in something like Wellbutrin.  Her spouse also smokes and she does not think that he would be willing to quit.  She is going to try.  5.  Disposition: Follow-up in 4 to 6 months or sooner if necessary.  We will help her to arrange for primary care follow-up.  Nicolasa Ducking, NP 09/21/2019, 4:38 PM

## 2019-09-21 NOTE — Patient Instructions (Addendum)
Medication Instructions:  1- Please take metoprolol as ordered 1 tablet (25 mg total) twice daily *If you need a refill on your cardiac medications before your next appointment, please call your pharmacy*   Lab Work: None ordered If you have labs (blood work) drawn today and your tests are completely normal, you will receive your results only by: Marland Kitchen MyChart Message (if you have MyChart) OR . A paper copy in the mail If you have any lab test that is abnormal or we need to change your treatment, we will call you to review the results.   Testing/Procedures: None ordered   Follow-Up: At [facility], you and your health needs are our priority.  As part of our continuing mission to provide you with exceptional heart care, we have created designated Provider Care Teams.  These Care Teams include your primary Cardiologist (physician) and Advanced Practice Providers (APPs -  Physician Assistants and Nurse Practitioners) who all work together to provide you with the care you need, when you need it.  We recommend signing up for the patient portal called "MyChart".  Sign up information is provided on this After Visit Summary.  MyChart is used to connect with patients for Virtual Visits (Telemedicine).  Patients are able to view lab/test results, encounter notes, upcoming appointments, etc.  Non-urgent messages can be sent to your provider as well.   To learn more about what you can do with MyChart, go to [email or web address].    Your next appointment:   6 month(s)  The format for your next appointment:   In Person  Provider:    You may see Lorine Bears, MD or Nicolasa Ducking, NP  Other Instructions Please set up primary care physician   You may call [facility] @ [phone] for a list of primary care providers in your area Or visit their website [email or web address] Please have any insurance card available before calling or going online.

## 2019-10-06 ENCOUNTER — Emergency Department
Admission: EM | Admit: 2019-10-06 | Discharge: 2019-10-06 | Disposition: A | Discharge status: Home / Self Care | Payer: Self-pay | Attending: Emergency Medicine | Admitting: Emergency Medicine

## 2019-10-06 ENCOUNTER — Emergency Department: Payer: Self-pay

## 2019-10-06 ENCOUNTER — Encounter: Payer: Self-pay | Admitting: Emergency Medicine

## 2019-10-06 DIAGNOSIS — Z5321 Procedure and treatment not carried out due to patient leaving prior to being seen by health care provider: Secondary | ICD-10-CM | POA: Insufficient documentation

## 2019-10-06 DIAGNOSIS — R079 Chest pain, unspecified: Secondary | ICD-10-CM | POA: Insufficient documentation

## 2019-10-06 DIAGNOSIS — R1011 Right upper quadrant pain: Secondary | ICD-10-CM | POA: Insufficient documentation

## 2019-10-06 LAB — BASIC METABOLIC PANEL
Anion gap: 8 (ref 5–15)
BUN: 13 mg/dL (ref 6–20)
CO2: 24 mmol/L (ref 22–32)
Calcium: 8.8 mg/dL — ABNORMAL LOW (ref 8.9–10.3)
Chloride: 106 mmol/L (ref 98–111)
Creatinine, Ser: 0.75 mg/dL (ref 0.44–1.00)
GFR calc Af Amer: >60 mL/min (ref >60)
GFR calc non Af Amer: >60 mL/min (ref >60)
Glucose, Bld: 110 mg/dL — ABNORMAL HIGH (ref 70–99)
Potassium: 4 mmol/L (ref 3.5–5.1)
Sodium: 138 mmol/L (ref 135–145)

## 2019-10-06 LAB — CBC
HCT: 38.2 % (ref 36.0–46.0)
Hemoglobin: 13.2 g/dL (ref 12.0–15.0)
MCH: 30.7 pg (ref 26.0–34.0)
MCHC: 34.6 g/dL (ref 30.0–36.0)
MCV: 88.8 fL (ref 80.0–100.0)
Platelets: 209 10*3/uL (ref 150–400)
RBC: 4.3 MIL/uL (ref 3.87–5.11)
RDW: 12.2 % (ref 11.5–15.5)
WBC: 9 10*3/uL (ref 4.0–10.5)
nRBC: 0 % (ref 0.0–0.2)

## 2019-10-06 LAB — URINE DRUG SCREEN, QUALITATIVE (ARMC ONLY)
Amphetamines, Ur Screen: NOT DETECTED
Barbiturates, Ur Screen: NOT DETECTED
Benzodiazepine, Ur Scrn: NOT DETECTED
Cannabinoid 50 Ng, Ur ~~LOC~~: NOT DETECTED
Cocaine Metabolite,Ur ~~LOC~~: NOT DETECTED
MDMA (Ecstasy)Ur Screen: NOT DETECTED
Methadone Scn, Ur: NOT DETECTED
Opiate, Ur Screen: NOT DETECTED
Phencyclidine (PCP) Ur S: NOT DETECTED
Tricyclic, Ur Screen: NOT DETECTED

## 2019-10-06 LAB — HEPATIC FUNCTION PANEL
ALT: 18 U/L (ref 0–44)
AST: 16 U/L (ref 15–41)
Albumin: 3.7 g/dL (ref 3.5–5.0)
Alkaline Phosphatase: 58 U/L (ref 38–126)
Bilirubin, Direct: <0.1 mg/dL (ref 0.0–0.2)
Total Bilirubin: 0.4 mg/dL (ref 0.3–1.2)
Total Protein: 7.8 g/dL (ref 6.5–8.1)

## 2019-10-06 LAB — TROPONIN I (HIGH SENSITIVITY): Troponin I (High Sensitivity): <2 ng/L (ref <18)

## 2019-10-06 LAB — LIPASE, BLOOD: Lipase: 34 U/L (ref 11–51)

## 2019-10-06 NOTE — ED Notes (Signed)
Pt called in the WR for the 3rd time for [place] with no response

## 2019-10-06 NOTE — ED Notes (Signed)
Pt called in the WR with no response 

## 2019-10-06 NOTE — ED Triage Notes (Signed)
Pt arrived via EMS from home with right sided chest pain that radiates into RUQ abdomin x3 days, with sudden onset of worsening this AM. Pt denies N/V.

## 2019-10-11 ENCOUNTER — Encounter: Payer: Self-pay | Admitting: *Deleted

## 2019-10-11 ENCOUNTER — Other Ambulatory Visit: Payer: Self-pay

## 2019-10-11 DIAGNOSIS — Z5321 Procedure and treatment not carried out due to patient leaving prior to being seen by health care provider: Secondary | ICD-10-CM | POA: Insufficient documentation

## 2019-10-11 DIAGNOSIS — M549 Dorsalgia, unspecified: Secondary | ICD-10-CM | POA: Insufficient documentation

## 2019-10-11 DIAGNOSIS — R109 Unspecified abdominal pain: Secondary | ICD-10-CM | POA: Insufficient documentation

## 2019-10-11 DIAGNOSIS — R1110 Vomiting, unspecified: Secondary | ICD-10-CM | POA: Insufficient documentation

## 2019-10-11 LAB — CBC
HCT: 39.5 % (ref 36.0–46.0)
Hemoglobin: 14.2 g/dL (ref 12.0–15.0)
MCH: 31.3 pg (ref 26.0–34.0)
MCHC: 35.9 g/dL (ref 30.0–36.0)
MCV: 87.2 fL (ref 80.0–100.0)
Platelets: 244 10*3/uL (ref 150–400)
RBC: 4.53 MIL/uL (ref 3.87–5.11)
RDW: 12.2 % (ref 11.5–15.5)
WBC: 9.8 10*3/uL (ref 4.0–10.5)
nRBC: 0 % (ref 0.0–0.2)

## 2019-10-11 LAB — URINALYSIS, COMPLETE (UACMP) WITH MICROSCOPIC
Bacteria, UA: NONE SEEN
Bilirubin Urine: NEGATIVE
Glucose, UA: NEGATIVE mg/dL
Hgb urine dipstick: NEGATIVE
Ketones, ur: NEGATIVE mg/dL
Leukocytes,Ua: NEGATIVE
Nitrite: NEGATIVE
Protein, ur: NEGATIVE mg/dL
Specific Gravity, Urine: 1.004 — ABNORMAL LOW (ref 1.005–1.030)
pH: 6 (ref 5.0–8.0)

## 2019-10-11 LAB — COMPREHENSIVE METABOLIC PANEL
ALT: 20 U/L (ref 0–44)
AST: 15 U/L (ref 15–41)
Albumin: 3.8 g/dL (ref 3.5–5.0)
Alkaline Phosphatase: 61 U/L (ref 38–126)
Anion gap: 11 (ref 5–15)
BUN: 13 mg/dL (ref 6–20)
CO2: 26 mmol/L (ref 22–32)
Calcium: 9.2 mg/dL (ref 8.9–10.3)
Chloride: 101 mmol/L (ref 98–111)
Creatinine, Ser: 0.8 mg/dL (ref 0.44–1.00)
GFR calc Af Amer: >60 mL/min (ref >60)
GFR calc non Af Amer: >60 mL/min (ref >60)
Glucose, Bld: 110 mg/dL — ABNORMAL HIGH (ref 70–99)
Potassium: 4.7 mmol/L (ref 3.5–5.1)
Sodium: 138 mmol/L (ref 135–145)
Total Bilirubin: 0.5 mg/dL (ref 0.3–1.2)
Total Protein: 8.3 g/dL — ABNORMAL HIGH (ref 6.5–8.1)

## 2019-10-11 LAB — LIPASE, BLOOD: Lipase: 34 U/L (ref 11–51)

## 2019-10-11 NOTE — ED Triage Notes (Signed)
Pt ambulatory to triage.  Pt has right side abd pain with vomiting. Sx for 2 hours.  No diarrhea.   Pt has right side back pain.  Pt alert.

## 2019-10-12 ENCOUNTER — Emergency Department
Admission: EM | Admit: 2019-10-12 | Discharge: 2019-10-12 | Disposition: A | Discharge status: Home / Self Care | Payer: Self-pay | Attending: Emergency Medicine | Admitting: Emergency Medicine

## 2019-11-06 ENCOUNTER — Encounter: Payer: Self-pay | Admitting: Emergency Medicine

## 2019-11-06 DIAGNOSIS — Z79899 Other long term (current) drug therapy: Secondary | ICD-10-CM | POA: Insufficient documentation

## 2019-11-06 DIAGNOSIS — F17210 Nicotine dependence, cigarettes, uncomplicated: Secondary | ICD-10-CM | POA: Insufficient documentation

## 2019-11-06 DIAGNOSIS — I10 Essential (primary) hypertension: Secondary | ICD-10-CM | POA: Insufficient documentation

## 2019-11-06 DIAGNOSIS — K8020 Calculus of gallbladder without cholecystitis without obstruction: Secondary | ICD-10-CM | POA: Insufficient documentation

## 2019-11-06 LAB — CBC
HCT: 40.7 % (ref 36.0–46.0)
Hemoglobin: 14.2 g/dL (ref 12.0–15.0)
MCH: 30.6 pg (ref 26.0–34.0)
MCHC: 34.9 g/dL (ref 30.0–36.0)
MCV: 87.7 fL (ref 80.0–100.0)
Platelets: 220 10*3/uL (ref 150–400)
RBC: 4.64 MIL/uL (ref 3.87–5.11)
RDW: 11.9 % (ref 11.5–15.5)
WBC: 9.7 10*3/uL (ref 4.0–10.5)
nRBC: 0 % (ref 0.0–0.2)

## 2019-11-06 LAB — URINALYSIS, COMPLETE (UACMP) WITH MICROSCOPIC
Bacteria, UA: NONE SEEN
Bilirubin Urine: NEGATIVE
Glucose, UA: NEGATIVE mg/dL
Hgb urine dipstick: NEGATIVE
Ketones, ur: NEGATIVE mg/dL
Leukocytes,Ua: NEGATIVE
Nitrite: NEGATIVE
Protein, ur: 30 mg/dL — AB
Specific Gravity, Urine: 1.026 (ref 1.005–1.030)
pH: 5 (ref 5.0–8.0)

## 2019-11-06 LAB — COMPREHENSIVE METABOLIC PANEL
ALT: 36 U/L (ref 0–44)
AST: 63 U/L — ABNORMAL HIGH (ref 15–41)
Albumin: 3.7 g/dL (ref 3.5–5.0)
Alkaline Phosphatase: 67 U/L (ref 38–126)
Anion gap: 11 (ref 5–15)
BUN: 15 mg/dL (ref 6–20)
CO2: 24 mmol/L (ref 22–32)
Calcium: 8.7 mg/dL — ABNORMAL LOW (ref 8.9–10.3)
Chloride: 104 mmol/L (ref 98–111)
Creatinine, Ser: 0.74 mg/dL (ref 0.44–1.00)
GFR, Estimated: >60 mL/min (ref >60)
Glucose, Bld: 123 mg/dL — ABNORMAL HIGH (ref 70–99)
Potassium: 3.8 mmol/L (ref 3.5–5.1)
Sodium: 139 mmol/L (ref 135–145)
Total Bilirubin: 0.8 mg/dL (ref 0.3–1.2)
Total Protein: 7.9 g/dL (ref 6.5–8.1)

## 2019-11-06 LAB — TROPONIN I (HIGH SENSITIVITY)
Troponin I (High Sensitivity): <2 ng/L (ref <18)
Troponin I (High Sensitivity): 3 ng/L (ref <18)

## 2019-11-06 LAB — LIPASE, BLOOD: Lipase: 32 U/L (ref 11–51)

## 2019-11-06 NOTE — ED Triage Notes (Signed)
Pt to ED via EMS from home with c/o upper right sided epigastric pain and diarrhea x3 months. Pt seen for same symptoms in past but has not had diagnosis.

## 2019-11-07 ENCOUNTER — Telehealth: Payer: Self-pay

## 2019-11-07 ENCOUNTER — Emergency Department: Payer: Self-pay

## 2019-11-07 ENCOUNTER — Emergency Department
Admission: EM | Admit: 2019-11-07 | Discharge: 2019-11-07 | Disposition: A | Discharge status: Home / Self Care | Payer: Self-pay | Attending: Emergency Medicine | Admitting: Emergency Medicine

## 2019-11-07 DIAGNOSIS — K8020 Calculus of gallbladder without cholecystitis without obstruction: Secondary | ICD-10-CM

## 2019-11-07 MED ORDER — LIDOCAINE VISCOUS HCL 2 % MT SOLN
15.0000 mL | Freq: Once | OROMUCOSAL | Status: AC
  Administered 2019-11-07: 15 mL via ORAL

## 2019-11-07 MED ORDER — FENTANYL CITRATE (PF) 100 MCG/2ML IJ SOLN
50.0000 ug | Freq: Once | INTRAMUSCULAR | Status: AC
  Administered 2019-11-07: 50 ug via INTRAVENOUS
  Filled 2019-11-07: qty 2

## 2019-11-07 MED ORDER — OXYCODONE-ACETAMINOPHEN 5-325 MG PO TABS: 1.0000 | ORAL_TABLET | ORAL | 0 refills | Status: DC | PRN

## 2019-11-07 MED ORDER — ONDANSETRON 4 MG PO TBDP: 4.0000 mg | ORAL_TABLET | Freq: Three times a day (TID) | ORAL | 0 refills | Status: DC | PRN

## 2019-11-07 MED ORDER — ALUM & MAG HYDROXIDE-SIMETH 200-200-20 MG/5ML PO SUSP
30.0000 mL | Freq: Once | ORAL | Status: AC
  Administered 2019-11-07: 30 mL via ORAL
  Filled 2019-11-07: qty 30

## 2019-11-07 MED ORDER — ONDANSETRON HCL 4 MG/2ML IJ SOLN
4.0000 mg | Freq: Once | INTRAMUSCULAR | Status: AC
  Administered 2019-11-07: 4 mg via INTRAVENOUS
  Filled 2019-11-07: qty 2

## 2019-11-07 NOTE — Telephone Encounter (Signed)
Tried reaching patient at this time-unable to LM, mailbox has not been set up.  Patient scheduled to see DR.Cannon 11/09/19 @ 1:30 pm.

## 2019-11-07 NOTE — Telephone Encounter (Signed)
Attempted to contact patient to get scheduled per [facility] and was unable to do so due to patient voicemail box not being set up.

## 2019-11-07 NOTE — ED Provider Notes (Signed)
[facility] Emergency Department Provider Note  ____________________________________________   First MD Initiated Contact with Patient 11/07/19 0257     (approximate)  I have reviewed the triage vital signs and the nursing notes.   HISTORY  Chief Complaint Abdominal Pain    [name] is a 54 y.o. female with below list of previous medical conditions presents to the emergency department secondary to intermittent upper abdominal pain with associated nausea and intermittent diarrhea over the past 3 months.  Patient states that tonight pain recurred after she ate chili.  Patient states that her pain was 10 out of 10.  Current pain score is 4 out of 10.  Patient denies any fever afebrile on presentation.  No urinary symptoms.       Past Medical History:  Diagnosis Date  . Chest pain    a. 05/2018 MV: EF 55-65%, small, mild, fixed apical ant and apical defect, most likely artifact. No ischemia-->Low risk.  . Essential hypertension   . History of echocardiogram    a. 02/2015 Echo: Ef 60-65%. no rwma.  Marland Kitchen PSVT (paroxysmal supraventricular tachycardia) (HCC)   . Tobacco abuse     Patient Active Problem List   Diagnosis Date Noted  . Paroxysmal supraventricular tachycardia (HCC) 02/21/2015  . Tobacco use 02/21/2015    Past Surgical History:  Procedure Laterality Date  . CESAREAN SECTION      Prior to Admission medications   Medication Sig Start Date End Date Taking? Authorizing Provider  albuterol (VENTOLIN HFA) 108 (90 Base) MCG/ACT inhaler Inhale 2 puffs into the lungs as directed. 09/21/19 11/13/20  Creig Hines, NP  aspirin EC 81 MG tablet Take 81 mg by mouth daily. Swallow whole.    [provider]  FLUoxetine (PROZAC) 10 MG tablet Take 10 mg by mouth daily.  08/08/18 09/20/28  [provider]  metoprolol tartrate (LOPRESSOR) 25 MG tablet TAKE 1 TABLET BY MOUTH TWICE A DAY 08/15/19   Duke Salvia, MD    omeprazole (PRILOSEC) 20 MG capsule Take 20 mg by mouth daily.    [provider]    Allergies Codeine  Family History  Family history unknown: Yes    Social History Social History   Tobacco Use  . Smoking status: Current Every Day Smoker    Packs/day: 1.50    Years: 30.00    Pack years: 45.00    Types: Cigarettes  . Smokeless tobacco: Never Used  Vaping Use  . Vaping Use: Never used  Substance Use Topics  . Alcohol use: Not Currently  . Drug use: Not Currently    Review of Systems Constitutional: No fever/chills Eyes: No visual changes. ENT: No sore throat. Cardiovascular: Denies chest pain. Respiratory: Denies shortness of breath. Gastrointestinal: Positive for abdominal pain nausea and intermittent diarrhea Genitourinary: Negative for dysuria. Musculoskeletal: Negative for neck pain.  Negative for back pain. Integumentary: Negative for rash. Neurological: Negative for headaches, focal weakness or numbness.  ____________________________________________   PHYSICAL EXAM:  VITAL SIGNS: ED Triage Vitals  Enc Vitals Group     BP 11/06/19 1920 (!) 150/80     Pulse Rate 11/06/19 1920 88     Resp 11/06/19 1920 17     Temp 11/06/19 1920 98.9 F (37.2 C)     Temp Source 11/06/19 1920 Oral     SpO2 11/06/19 1920 98 %     Weight --      Height --      Head Circumference --  Peak Flow --      Pain Score 11/07/19 0313 4     Pain Loc --      Pain Edu? --      Excl. in GC? --     Constitutional: Alert and oriented.  Eyes: Conjunctivae are normal.  Head: Atraumatic. Mouth/Throat: Patient is wearing a mask. Neck: No stridor.  No meningeal signs.   Cardiovascular: Normal rate, regular rhythm. Good peripheral circulation. Grossly normal heart sounds. Respiratory: Normal respiratory effort.  No retractions. Gastrointestinal: Soft and nontender. No distention.  Musculoskeletal: No lower extremity tenderness nor edema. No gross deformities of  extremities. Neurologic:  Normal speech and language. No gross focal neurologic deficits are appreciated.  Skin:  Skin is warm, dry and intact. Psychiatric: Mood and affect are normal. Speech and behavior are normal.  ____________________________________________   LABS (all labs ordered are listed, but only abnormal results are displayed)  Labs Reviewed  COMPREHENSIVE METABOLIC PANEL - Abnormal; Notable for the following components:      Result Value   Glucose, Bld 123 (*)    Calcium 8.7 (*)    AST 63 (*)    All other components within normal limits  URINALYSIS, COMPLETE (UACMP) WITH MICROSCOPIC - Abnormal; Notable for the following components:   Color, Urine AMBER (*)    APPearance CLOUDY (*)    Protein, ur 30 (*)    All other components within normal limits  LIPASE, BLOOD  CBC  TROPONIN I (HIGH SENSITIVITY)  TROPONIN I (HIGH SENSITIVITY)   ____________________________________________  EKG  ED ECG REPORT I, [place] N Justino Boze, the attending physician, personally viewed and interpreted this ECG.   Date: 11/07/2019  EKG Time: 7:24 PM  Rate: 77  Rhythm: Normal sinus rhythm  Axis: Normal  Intervals: Normal  ST&T Change: None  ____________________________________________  RADIOLOGY I, [place] N Alivya Wegman, personally viewed and evaluated these images (plain radiographs) as part of my medical decision making, as well as reviewing the written report by the radiologist.  ED MD interpretation: Cholelithiasis without any sonographic evidence of acute cholecystitis per radiologist  Official radiology report(s): US ABDOMEN LIMITED RUQ (LIVER/GB)  Result Date: 11/07/2019 CLINICAL DATA:  54 year old female with upper abdominal pain. EXAM: ULTRASOUND ABDOMEN LIMITED RIGHT UPPER QUADRANT COMPARISON:  None. FINDINGS: Gallbladder: There is a 15 mm gallstone. Mild thickened appearance of the gallbladder wall measuring 4 mm, likely partly related to partial contraction. No pericholecystic  fluid. Negative sonographic Murphy's sign. Common bile duct: Diameter: 3 mm Liver: There is diffuse increased liver echogenicity most commonly seen in the setting of fatty infiltration. Superimposed inflammation or fibrosis is not excluded. Clinical correlation is recommended. Portal vein is patent on color Doppler imaging with normal direction of blood flow towards the liver. Other: None. IMPRESSION: 1. Cholelithiasis without sonographic evidence of acute cholecystitis. 2. Fatty liver. Electronically Signed   By: Elgie Collard M.D.   On: 11/07/2019 02:23     Procedures   ____________________________________________   INITIAL IMPRESSION / MDM / ASSESSMENT AND PLAN / ED COURSE  As part of my medical decision making, I reviewed the following data within the electronic MEDICAL RECORD NUMBER  55 year old female presented with above-stated history and physical exam a differential diagnosis including but not limited to cholelithiasis cholecystitis gastritis.  Ultrasound revealed evidence of cholelithiasis without evidence of cholecystitis.  Spoke with patient at length regarding this finding.  Patient will be referred to Dr. Lady Gary general surgery ____________________________________________  FINAL CLINICAL IMPRESSION(S) / ED DIAGNOSES  Final diagnoses:  Calculus of gallbladder without cholecystitis without obstruction     MEDICATIONS GIVEN DURING THIS VISIT:  Medications  ondansetron (ZOFRAN) injection 4 mg (4 mg Intravenous Given 11/07/19 0329)  fentaNYL (SUBLIMAZE) injection 50 mcg (50 mcg Intravenous Given 11/07/19 0329)  alum & mag hydroxide-simeth (MAALOX/MYLANTA) 200-200-20 MG/5ML suspension 30 mL (30 mLs Oral Given 11/07/19 0329)    And  lidocaine (XYLOCAINE) 2 % viscous mouth solution 15 mL (15 mLs Oral Given 11/07/19 0329)     ED Discharge Orders    None      *Please note:  Tanya Hardin was evaluated in Emergency Department on 11/07/2019 for the symptoms described in the  history of present illness. She was evaluated in the context of the global COVID-19 pandemic, which necessitated consideration that the patient might be at risk for infection with the SARS-CoV-2 virus that causes COVID-19. Institutional protocols and algorithms that pertain to the evaluation of patients at risk for COVID-19 are in a state of rapid change based on information released by regulatory bodies including the CDC and federal and state organizations. These policies and algorithms were followed during the patient's care in the ED.  Some ED evaluations and interventions may be delayed as a result of limited staffing during and after the pandemic.*  Note:  This document was prepared using Dragon voice recognition software and may include unintentional dictation errors.   Darci Current, MD 11/07/19 [phone]

## 2019-11-09 ENCOUNTER — Telehealth: Payer: Self-pay | Admitting: Emergency Medicine

## 2019-11-09 ENCOUNTER — Encounter: Payer: Self-pay | Admitting: General Surgery

## 2019-11-09 ENCOUNTER — Ambulatory Visit (INDEPENDENT_AMBULATORY_CARE_PROVIDER_SITE_OTHER): Payer: Self-pay | Admitting: General Surgery

## 2019-11-09 ENCOUNTER — Encounter: Payer: Self-pay | Admitting: Emergency Medicine

## 2019-11-09 ENCOUNTER — Other Ambulatory Visit: Payer: Self-pay

## 2019-11-09 VITALS — BP 127/79 | HR 76 | Temp 98.0°F | Resp 14 | Ht 62.0 in | Wt 192.8 lb

## 2019-11-09 DIAGNOSIS — K8020 Calculus of gallbladder without cholecystitis without obstruction: Secondary | ICD-10-CM

## 2019-11-09 NOTE — Progress Notes (Signed)
Patient ID: Tanya Hardin, female   DOB: 1965-04-24, 54 y.o.   MRN: [id number]  Chief Complaint  Patient presents with  . New Patient (Initial Visit)    gallstones    [name] is a 54 y.o. female.   She is here today to follow-up from an emergency department visit on November 06, 2019.  She reports that off and on, throughout the past summer, she has had episodes of indigestion and epigastric/right upper quadrant pain.  On the day of her presentation to the emergency room, however, the pain was severe and accompanied by diarrhea and vomiting.  She reports that she ate some chili that day.  Evaluation in the emergency department showed gallstones and she was diagnosed with symptomatic cholelithiasis.  Today, she reports that she has modified her diet to try and avoid having a recurrence of her symptoms.  She says that she is eating less than usual and drinking only water.  She has been eating bland foods and trying to avoid anything fried or fatty.  She has not experienced any fevers or chills and no recurrence of nausea or vomiting.  She denies any history of pancreatitis.  She has never had jaundice, acholic stools, or dark, tea-colored urine.  She has never had any upper abdominal surgery.   Past Medical History:  Diagnosis Date  . Chest pain    a. 05/2018 MV: EF 55-65%, small, mild, fixed apical ant and apical defect, most likely artifact. No ischemia-->Low risk.  Marland Kitchen COPD (chronic obstructive pulmonary disease) (HCC)   . Essential hypertension   . History of echocardiogram    a. 02/2015 Echo: Ef 60-65%. no rwma.  Marland Kitchen PSVT (paroxysmal supraventricular tachycardia) (HCC)   . Tobacco abuse     Past Surgical History:  Procedure Laterality Date  . CESAREAN SECTION      Family History  Family history unknown: Yes    Social History Social History   Tobacco Use  . Smoking status: Current Every Day Smoker    Packs/day: 1.50    Years: 30.00    Pack years: 45.00    Types:  Cigarettes  . Smokeless tobacco: Never Used  Vaping Use  . Vaping Use: Never used  Substance Use Topics  . Alcohol use: Not Currently  . Drug use: Not Currently    Allergies  Allergen Reactions  . Codeine Itching, Nausea And Vomiting and Other (See Comments)    Sweaty, severe vomiting, room spinning.     Current Outpatient Medications  Medication Sig Dispense Refill  . FLUoxetine (PROZAC) 10 MG tablet Take 10 mg by mouth daily.     . metoprolol tartrate (LOPRESSOR) 25 MG tablet TAKE 1 TABLET BY MOUTH TWICE A DAY 180 tablet 3  . albuterol (VENTOLIN HFA) 108 (90 Base) MCG/ACT inhaler Inhale 2 puffs into the lungs as directed. (Patient not taking: Reported on 11/09/2019) 1 each 3  . aspirin EC 81 MG tablet Take 81 mg by mouth daily. Swallow whole. (Patient not taking: Reported on 11/09/2019)    . omeprazole (PRILOSEC) 20 MG capsule Take 20 mg by mouth daily. (Patient not taking: Reported on 11/09/2019)    . ondansetron (ZOFRAN ODT) 4 MG disintegrating tablet Take 1 tablet (4 mg total) by mouth every 8 (eight) hours as needed. (Patient not taking: Reported on 11/09/2019) 20 tablet 0  . oxyCODONE-acetaminophen (PERCOCET) 5-325 MG tablet Take 1 tablet by mouth every 4 (four) hours as needed. (Patient not taking: Reported on 11/09/2019) 20 tablet  0   No current facility-administered medications for this visit.    Review of Systems Review of Systems  Eyes: Positive for visual disturbance.       Blurry vision  Respiratory: Positive for shortness of breath.   Gastrointestinal: Positive for abdominal pain, diarrhea, nausea and vomiting.       Heartburn  All other systems were reviewed and are negative or as discussed in the history of present illness.  Blood pressure 127/79, pulse 76, temperature 98 F (36.7 C), resp. rate 14, height 5\' 2"  (1.575 m), weight 192 lb 12.8 oz (87.5 kg), last menstrual period 06/27/2014, SpO2 95 %. Body mass index is 35.26 kg/m.  Physical Exam Physical  Exam Constitutional:      General: She is not in acute distress.    Appearance: She is obese.  HENT:     Head: Normocephalic and atraumatic.     Nose:     Comments: Covered with a mask    Mouth/Throat:     Comments: Covered with a mask Eyes:     General: No scleral icterus.       Right eye: No discharge.        Left eye: No discharge.  Neck:     Comments: No palpable cervical or supraclavicular lymphadenopathy.  The trachea is midline.  No dominant thyroid masses or thyromegaly appreciated.  The gland moves freely with deglutition. Cardiovascular:     Rate and Rhythm: Normal rate and regular rhythm.     Pulses: Normal pulses.     Heart sounds: Normal heart sounds.  Pulmonary:     Effort: Pulmonary effort is normal.     Breath sounds: Normal breath sounds.  Abdominal:     General: Bowel sounds are normal.     Palpations: Abdomen is soft.     Comments: She denies any pain on palpation.  Murphy sign is negative.  She is obese.  Genitourinary:    Comments: Deferred Musculoskeletal:        General: No deformity or signs of injury.  Skin:    General: Skin is warm and dry.  Neurological:     General: No focal deficit present.     Mental Status: She is alert and oriented to person, place, and time.  Psychiatric:        Mood and Affect: Mood normal.        Behavior: Behavior normal.     Data Reviewed Labs from her recent emergency room visit were reviewed and copied here.  No significant abnormalities identified.  Results for ALEASHA, FREGEAU (MRN [name]) as of 11/09/2019 17:21  Ref. Range 11/06/2019 19:22  Sodium Latest Ref Range: 135 - 145 mmol/L 139  Potassium Latest Ref Range: 3.5 - 5.1 mmol/L 3.8  Chloride Latest Ref Range: 98 - 111 mmol/L 104  CO2 Latest Ref Range: 22 - 32 mmol/L 24  Glucose Latest Ref Range: 70 - 99 mg/dL 11/08/2019 (H)  BUN Latest Ref Range: 6 - 20 mg/dL 15  Creatinine Latest Ref Range: 0.44 - 1.00 mg/dL 062  Calcium Latest Ref Range: 8.9 - 10.3  mg/dL 8.7 (L)  Anion gap Latest Ref Range: 5 - 15  11  Alkaline Phosphatase Latest Ref Range: 38 - 126 U/L 67  Albumin Latest Ref Range: 3.5 - 5.0 g/dL 3.7  Lipase Latest Ref Range: 11 - 51 U/L 32  AST Latest Ref Range: 15 - 41 U/L 63 (H)  ALT Latest Ref Range: 0 - 44 U/L 36  Total Protein Latest Ref Range: 6.5 - 8.1 g/dL 7.9  Total Bilirubin Latest Ref Range: 0.3 - 1.2 mg/dL 0.8  GFR, Estimated Latest Ref Range: >60 mL/min >60  Troponin I (High Sensitivity) Latest Ref Range: <18 ng/L <2  WBC Latest Ref Range: 4.0 - 10.5 K/uL 9.7  RBC Latest Ref Range: 3.87 - 5.11 MIL/uL 4.64  Hemoglobin Latest Ref Range: 12.0 - 15.0 g/dL 16.114.2  HCT Latest Ref Range: 36 - 46 % 40.7  MCV Latest Ref Range: 80.0 - 100.0 fL 87.7  MCH Latest Ref Range: 26.0 - 34.0 pg 30.6  MCHC Latest Ref Range: 30.0 - 36.0 g/dL 09.634.9  RDW Latest Ref Range: 11.5 - 15.5 % 11.9  Platelets Latest Ref Range: 150 - 400 K/uL 220  nRBC Latest Ref Range: 0.0 - 0.2 % 0.0  Appearance Latest Ref Range: CLEAR  CLOUDY (A)  Bilirubin Urine Latest Ref Range: NEGATIVE  NEGATIVE  Color, Urine Latest Ref Range: YELLOW  AMBER (A)  Glucose, UA Latest Ref Range: NEGATIVE mg/dL NEGATIVE  Hgb urine dipstick Latest Ref Range: NEGATIVE  NEGATIVE  Ketones, ur Latest Ref Range: NEGATIVE mg/dL NEGATIVE  Leukocytes,Ua Latest Ref Range: NEGATIVE  NEGATIVE  Nitrite Latest Ref Range: NEGATIVE  NEGATIVE  pH Latest Ref Range: 5.0 - 8.0  5.0  Protein Latest Ref Range: NEGATIVE mg/dL 30 (A)  Specific Gravity, Urine Latest Ref Range: 1.005 - 1.030  1.026  Bacteria, UA Latest Ref Range: NONE SEEN  NONE SEEN  Ca Oxalate Crys, UA Unknown PRESENT  Mucus Unknown PRESENT  RBC / HPF Latest Ref Range: 0 - 5 RBC/hpf 0-5  Squamous Epithelial / LPF Latest Ref Range: 0 - 5  11-20  WBC, UA Latest Ref Range: 0 - 5 WBC/hpf 6-10   I also reviewed the ultrasound performed that evening and concur with the radiologist impression copied here: CLINICAL DATA:  54 year old  female with upper abdominal pain.  EXAM: ULTRASOUND ABDOMEN LIMITED RIGHT UPPER QUADRANT  COMPARISON:  None.  FINDINGS: Gallbladder:  There is a 15 mm gallstone. Mild thickened appearance of the gallbladder wall measuring 4 mm, likely partly related to partial contraction. No pericholecystic fluid. Negative sonographic Murphy's sign.  Common bile duct:  Diameter: 3 mm  Liver:  There is diffuse increased liver echogenicity most commonly seen in the setting of fatty infiltration. Superimposed inflammation or fibrosis is not excluded. Clinical correlation is recommended. Portal vein is patent on color Doppler imaging with normal direction of blood flow towards the liver.  Other: None.  IMPRESSION: 1. Cholelithiasis without sonographic evidence of acute cholecystitis. 2. Fatty liver.  I reviewed the recent cardiology note from September 21, 2019.  This does discuss prior cardiac evaluation, including a history of paroxysmal SVT with a nonischemic stress test in May 2020.  She also had an echocardiogram in February 2017 that showed a normal ejection fraction.  Via the electronic medical record, I was able to pulmonary function tests.  The results are copied here: Miscellaneous Results - documented in this encounter  Spirometry (09/01/2018 12:10 PM EDT) Spirometry (09/01/2018 12:10 PM EDT)  Component Value Ref Range Performed At Pathologist Signature  FVC PRE 2.58 2.42 - 3.85 L [facility]   FEV1 PRE 1.81 (L) 1.93 - 3.08 L [facility]   FEV1/FVC PRE 70.14 68.81 - 91.74 % [facility]   FEV6 PRE 2.49 (L) 2.50 - 3.78 L [facility]   FEV1/FEV6 PRE 72.62 (L) 72.87 - 90.47 % [facility]   FEF25-75% PRE 1.09 (  L) 1.32 - 3.56 L/s [facility]   ISOFEF25-75 PRE 1.09 L/s [facility]   FEF50% PRE 1.84 (L) 1.87 - 5.49 L/s [facility]   PEF PRE 5.64 4.69 - 7.93 L/s [facility]   FET100% Change 7.84 sec [facility]   FIVC PRE 1.44 (L) 2.42 - 3.85 L [facility]   FIF50% PRED 0.90 L/s [facility]   ZOX/WRU04 pre  203.55 % [facility]   PIF PRE 1.52 (L) 2.58 - 7.38 L/s [facility]   Vol extrap pre 0.03 L [facility]    She was diagnosed with COPD.  Assessment This is a 54 year old woman with symptomatic cholelithiasis.  I have recommended that she undergo robot-assisted laparoscopic cholecystectomy.  Plan I discussed the procedure in detail.  We discussed the risks and benefits of a laparoscopic cholecystectomy and possible cholangiogram including, but not limited to: bleeding, infection, injury to surrounding structures such as the intestine or liver, bile leak, retained gallstones, need to convert to an open procedure, prolonged diarrhea, blood clots such as DVT, common bile duct injury, anesthesia risks, and possible need for additional procedures. The patient had the opportunity to ask any questions and these were answered to her satisfaction.  We will request medical and cardiac clearance prior to her operation.     Duanne Guess 11/09/2019, 5:15 PM

## 2019-11-09 NOTE — Telephone Encounter (Signed)
Cardiac clearance sent to Dr Graciela Husbands  P:[phone]  Medical Clearance sent to [place], [place] [phone]

## 2019-11-09 NOTE — Patient Instructions (Addendum)
We have sent Pulmonary and Cardiac clearances to your doctors. Their office will contact you to schedule you appointments if you need to be seen prior to be getting clearances.   We will contact Tanya Hardin to help you with quitting smoking. He will contact you.   Our surgery scheduler will contact you to schedule your surgery. Please have the [facility] Sheet available when she calls you.   Laparoscopic Cholecystectomy Laparoscopic cholecystectomy is surgery to remove the gallbladder. The gallbladder is a pear-shaped organ that lies beneath the liver on the right side of the body. The gallbladder stores bile, which is a fluid that helps the body to digest fats. Cholecystectomy is often done for inflammation of the gallbladder (cholecystitis). This condition is usually caused by a buildup of gallstones (cholelithiasis) in the gallbladder. Gallstones can block the flow of bile, which can result in inflammation and pain. In severe cases, emergency surgery may be required. This procedure is done though small incisions in your abdomen (laparoscopic surgery). A thin scope with a camera (laparoscope) is inserted through one incision. Thin surgical instruments are inserted through the other incisions. In some cases, a laparoscopic procedure may be turned into a type of surgery that is done through a larger incision (open surgery). Tell a health care provider about:  Any allergies you have.  All medicines you are taking, including vitamins, herbs, eye drops, creams, and over-the-counter medicines.  Any problems you or family members have had with anesthetic medicines.  Any blood disorders you have.  Any surgeries you have had.  Any medical conditions you have.  Whether you are pregnant or may be pregnant. What are the risks? Generally, this is a safe procedure. However, problems may occur, including:  Infection.  Bleeding.  Allergic reactions to medicines.  Damage to other structures or  organs.  A stone remaining in the common bile duct. The common bile duct carries bile from the gallbladder into the small intestine.  A bile leak from the cyst duct that is clipped when your gallbladder is removed. What happens before the procedure? Staying hydrated Follow instructions from your health care provider about hydration, which may include:  Up to 2 hours before the procedure - you may continue to drink clear liquids, such as water, clear fruit juice, black coffee, and plain tea. Eating and drinking restrictions Follow instructions from your health care provider about eating and drinking, which may include:  8 hours before the procedure - stop eating heavy meals or foods such as meat, fried foods, or fatty foods.  6 hours before the procedure - stop eating light meals or foods, such as toast or cereal.  6 hours before the procedure - stop drinking milk or drinks that contain milk.  2 hours before the procedure - stop drinking clear liquids. Medicines  Ask your health care provider about: ? Changing or stopping your regular medicines. This is especially important if you are taking diabetes medicines or blood thinners. ? Taking medicines such as aspirin and ibuprofen. These medicines can thin your blood. Do not take these medicines before your procedure if your health care provider instructs you not to.  You may be given antibiotic medicine to help prevent infection. General instructions  Let your health care provider know if you develop a cold or an infection before surgery.  Plan to have someone take you home from the hospital or clinic.  Ask your health care provider how your surgical site will be marked or identified. What happens during  the procedure?   To reduce your risk of infection: ? Your health care team will wash or sanitize their hands. ? Your skin will be washed with soap. ? Hair may be removed from the surgical area.  An IV tube may be inserted into  one of your veins.  You will be given one or more of the following: ? A medicine to help you relax (sedative). ? A medicine to make you fall asleep (general anesthetic).  A breathing tube will be placed in your mouth.  Your surgeon will make several small cuts (incisions) in your abdomen.  The laparoscope will be inserted through one of the small incisions. The camera on the laparoscope will send images to a TV screen (monitor) in the operating room. This lets your surgeon see inside your abdomen.  Air-like gas will be pumped into your abdomen. This will expand your abdomen to give the surgeon more room to perform the surgery.  Other tools that are needed for the procedure will be inserted through the other incisions. The gallbladder will be removed through one of the incisions.  Your common bile duct may be examined. If stones are found in the common bile duct, they may be removed.  After your gallbladder has been removed, the incisions will be closed with stitches (sutures), staples, or skin glue.  Your incisions may be covered with a bandage (dressing). The procedure may vary among health care providers and hospitals. What happens after the procedure?  Your blood pressure, heart rate, breathing rate, and blood oxygen level will be monitored until the medicines you were given have worn off.  You will be given medicines as needed to control your pain.  Do not drive for 24 hours if you were given a sedative. This information is not intended to replace advice given to you by your health care provider. Make sure you discuss any questions you have with your health care provider. Document Revised: 12/18/2016 Document Reviewed: 06/24/2015 Elsevier Patient Education  2020 [employer].

## 2019-11-10 ENCOUNTER — Telehealth: Payer: Self-pay | Admitting: *Deleted

## 2019-11-10 NOTE — Telephone Encounter (Signed)
Received referral from Dr. Lady Gary to contact patient regarding smoking cessation prior to planned surgery. Contacted patient and discussed options available, including online/app based assistance, [facility] tobacco cessation programs lasting 4 weeks, and [facility]essation program through [facility] with individualized care from Mignon Pine NP. [facility]. Appt given for Tuesday October 26th at 230pm. Patient verbalized understanding and agreement.

## 2019-11-13 ENCOUNTER — Telehealth: Payer: Self-pay | Admitting: General Surgery

## 2019-11-13 ENCOUNTER — Telehealth: Payer: Self-pay | Admitting: *Deleted

## 2019-11-13 NOTE — Telephone Encounter (Signed)
   [facility] Pre-operative Risk Assessment    HEARTCARE STAFF: - Please ensure there is not already an duplicate clearance open for this procedure. - Under Visit Info/Reason for Call, type in Other and utilize the format Clearance MM/DD/YY or Clearance TBD. Do not use dashes or single digits. - If request is for dental extraction, please clarify the # of teeth to be extracted.  Request for surgical clearance:  1. What type of surgery is being performed? ROBOTIC-ASSISTED LAP CHOLEY   2. When is this surgery scheduled? TBD   3. What type of clearance is required (medical clearance vs. Pharmacy clearance to hold med vs. Both)? MEDICAL  4. Are there any medications that need to be held prior to surgery and how long? ASA    5. Practice name and name of physician performing surgery? [facility]; DR. Celine Ahr   6. What is the office phone number? [phone]   7.   What is the office fax number? [phone]  8.   Anesthesia type (None, local, MAC, general) ? GENERAL   Julaine Hua 11/13/2019, 4:30 PM  _________________________________________________________________   (provider comments below)

## 2019-11-13 NOTE — Telephone Encounter (Signed)
Attempted to call patient for pre-op evaluation. Boyfriend answered the phone. Patient not at home. Boyfriend will tell patient to call us tonight if home before 5pm or tomorrow.

## 2019-11-13 NOTE — Telephone Encounter (Signed)
Tanya Kaufmann,  Tanya Hardin is scheduled for a robotic-assisted laparoscopic cholecystectomy (like 12/18/2019). She has a history of chest pain with low risk Myoview in 05/2018 paroxysmal SVT, COPD, and tobacco abuse. Prior Echo in 02/2015 showed normal EF with no wall motion abnormalities. In 05/2018, she was experiencing worsening dyspnea so Myoview was ordered and was low risk (there was question of a small/mild fixed apical anterior and apical defect which was felt to mostly like represent artifact. She was recently seen by Ward Givens on 09/21/2019 after recent ED visit for epigastric pain with associated vomiting. EKG was non-acute and high-sensitivity troponin was normal x2. At visit with Thayer Ohm, symptoms were felt to be GI related and not cardiac in nature. She ended up being diagnosed with cholelithiasis and lap chole has been scheduled.  I called and spoke with patient today. She denies any chest pain at rest or with exertion. Epigastric pain has improved with diet changes. She does note pretty significant dyspnea on exertion though that she reports has worsened. She states we she walks up step, she gets significantly short of breath and has pounding heart rate. She gets so short of breath that she feels like she needs a breathing treatment or needs to call 911 for O2. No She thinks this is due to worsening COPD as she is a heavy smoker. No significant dyspnea at rest although sounds like she probably has undiagnosed sleep apnea. No lower extremity edema. I think dyspnea is multifactorial and due to COPD, obesity, and deconditioning. I really do not think this due to CHF or anginal equivalent. However, given significant dyspnea, do you feel like she needs repeat ischemic evaluation (stress test vs coronary CTA) or Echo prior to surgery.  Please route response to P CV DIV PREOP.  Thank you! Carsyn Boster

## 2019-11-13 NOTE — Telephone Encounter (Signed)
Patient has been advised of Pre-Admission date/time, COVID Testing date and Surgery date.  Surgery Date: 12/18/19 Preadmission Testing Date: 12/08/19 (phone 1p-5p) Covid Testing Date: 12/15/19 - patient [facility] ([address] [facility]) between 8a-1p   Patient has been made aware to call [phone], between 1-3:00pm the day before surgery, to find out what time to arrive for surgery.

## 2019-11-14 ENCOUNTER — Inpatient Hospital Stay: Payer: Self-pay | Attending: Oncology | Admitting: Oncology

## 2019-11-14 DIAGNOSIS — Z122 Encounter for screening for malignant neoplasm of respiratory organs: Secondary | ICD-10-CM | POA: Insufficient documentation

## 2019-11-14 DIAGNOSIS — Z87891 Personal history of nicotine dependence: Secondary | ICD-10-CM

## 2019-11-14 DIAGNOSIS — Z720 Tobacco use: Secondary | ICD-10-CM

## 2019-11-14 DIAGNOSIS — F17210 Nicotine dependence, cigarettes, uncomplicated: Secondary | ICD-10-CM | POA: Insufficient documentation

## 2019-11-14 NOTE — Progress Notes (Signed)
Smoking Cessation Clinic  [facility], [place]  [phone] [phone]  Patient Care Team: System, Provider Not In as PCP - General Iran Ouch, MD as PCP - Cardiology (Cardiology)   Name of the patient: Tanya Hardin  [id number]  1965/05/08   Date of visit: 11/14/2019   Chief complaint/ Reason for visit- Smoking Cessation counseling  PMH-Tanya Hardin is a 54 year old female with past medical history significant for intermittent tachycardia currently managed with metoprolol, hypertension, COPD and tobacco dependence.  She was referred from our low-dose CT screening program.  She is scheduled to have her gallbladder removed at the end of November and the surgeon wishes for her to quit smoking.  Interval history- Tanya Hardin is a 54 yo patient who presents to smoking cessation clinic to discuss strategies and techniques to quit smoking.  The goal of this program is to implement proven ineffective interventions in a clinical setting to successfully quit smoking.   During our visit today, we will discuss strategies for tobacco cessation.   Smoking history:   Tobacco Use: High Risk  . Smoking Tobacco Use: Current Every Day Smoker  . Smokeless Tobacco Use: Never Used     Type of tobacco:   Cigarettes   Approximate date of last quit attempt: 1-2 year ago    Longest period of time quit in the past:9 months-long enough to have her daughter   What caused the relapse: Stress   Medication used in the past:  Nicotine patch  Nicotine gum  Nicotine lozenge  Nicotine nasal spray  Nicotine oral inhaler  Cold [place]  Readiness to quit: Patient states she is ready to quit.  Other smokers in household: yes  Plan: Quit date: TBD  Today, she states she is willing to try to quit but has no health insurance or money.  She is currently unemployed secondary to shortness of breath with exertion.  Her shortness of breath has become progressive  over the past year.  She was started on inhalers for her COPD which she is currently not using secondary to expense.  She is able to afford her metoprolol for her tachycardia and Prozac for her anxiety/depression.  She is asking for help to apply for Medicaid or count financial assistance over the phone as she has very limited transportation.  She currently smokes 1 to 1-1/2 packs of cigarettes per day.  She has been smoking since she was 54 years old.  She has tried many medications to quit smoking and none of them have worked.  She is interested in trying Chantix but cannot afford it.  ECOG FS:1 - Symptomatic but completely ambulatory  Review of systems- Review of Systems  Constitutional: Negative.  Negative for chills, fever, malaise/fatigue and weight loss.  HENT: Negative for congestion, ear pain and tinnitus.   Eyes: Negative.  Negative for blurred vision and double vision.  Respiratory: Positive for shortness of breath. Negative for cough and sputum production.   Cardiovascular: Negative.  Negative for chest pain, palpitations and leg swelling.  Gastrointestinal: Negative.  Negative for abdominal pain, constipation, diarrhea, nausea and vomiting.  Genitourinary: Negative for dysuria, frequency and urgency.  Musculoskeletal: Negative for back pain and falls.  Skin: Negative.  Negative for rash.  Neurological: Negative.  Negative for weakness and headaches.  Endo/Heme/Allergies: Negative.  Does not bruise/bleed easily.  Psychiatric/Behavioral: Negative.  Negative for depression. The patient is not nervous/anxious and does not have insomnia.       Allergies  Allergen Reactions  . Codeine Itching, Nausea And Vomiting and Other (See Comments)    Sweaty, severe vomiting, room spinning.      Past Medical History:  Diagnosis Date  . Chest pain    a. 05/2018 MV: EF 55-65%, small, mild, fixed apical ant and apical defect, most likely artifact. No ischemia-->Low risk.  Marland Kitchen. COPD (chronic  obstructive pulmonary disease) (HCC)   . Essential hypertension   . History of echocardiogram    a. 02/2015 Echo: Ef 60-65%. no rwma.  Marland Kitchen. PSVT (paroxysmal supraventricular tachycardia) (HCC)   . Tobacco abuse      Past Surgical History:  Procedure Laterality Date  . CESAREAN SECTION      Social History   Socioeconomic History  . Marital status: Single    Spouse name: Not on file  . Number of children: Not on file  . Years of education: Not on file  . Highest education level: Not on file  Occupational History  . Not on file  Tobacco Use  . Smoking status: Current Every Day Smoker    Packs/day: 1.50    Years: 30.00    Pack years: 45.00    Types: Cigarettes  . Smokeless tobacco: Never Used  Vaping Use  . Vaping Use: Never used  Substance and Sexual Activity  . Alcohol use: Not Currently  . Drug use: Not Currently  . Sexual activity: Not on file  Other Topics Concern  . Not on file  Social History Narrative  . Not on file   Social Determinants of Health   Financial Resource Strain:   . Difficulty of Paying Living Expenses: Not on file  Food Insecurity:   . Worried About Programme researcher, broadcasting/film/videounning Out of Food in the Last Year: Not on file  . Ran Out of Food in the Last Year: Not on file  Transportation Needs:   . Lack of Transportation (Medical): Not on file  . Lack of Transportation (Non-Medical): Not on file  Physical Activity:   . Days of Exercise per Week: Not on file  . Minutes of Exercise per Session: Not on file  Stress:   . Feeling of Stress : Not on file  Social Connections:   . Frequency of Communication with Friends and Family: Not on file  . Frequency of Social Gatherings with Friends and Family: Not on file  . Attends Religious Services: Not on file  . Active Member of Clubs or Organizations: Not on file  . Attends BankerClub or Organization Meetings: Not on file  . Marital Status: Not on file  Intimate Partner Violence:   . Fear of Current or Ex-Partner: Not on file  .  Emotionally Abused: Not on file  . Physically Abused: Not on file  . Sexually Abused: Not on file    Family History  Family history unknown: Yes     Current Outpatient Medications:  .  albuterol (VENTOLIN HFA) 108 (90 Base) MCG/ACT inhaler, Inhale 2 puffs into the lungs as directed. (Patient not taking: Reported on 11/09/2019), Disp: 1 each, Rfl: 3 .  aspirin EC 81 MG tablet, Take 81 mg by mouth daily. Swallow whole. (Patient not taking: Reported on 11/09/2019), Disp: , Rfl:  .  FLUoxetine (PROZAC) 10 MG tablet, Take 10 mg by mouth daily. , Disp: , Rfl:  .  metoprolol tartrate (LOPRESSOR) 25 MG tablet, TAKE 1 TABLET BY MOUTH TWICE A DAY, Disp: 180 tablet, Rfl: 3 .  omeprazole (PRILOSEC) 20 MG capsule, Take 20 mg by mouth daily. (Patient  not taking: Reported on 11/09/2019), Disp: , Rfl:  .  ondansetron (ZOFRAN ODT) 4 MG disintegrating tablet, Take 1 tablet (4 mg total) by mouth every 8 (eight) hours as needed. (Patient not taking: Reported on 11/09/2019), Disp: 20 tablet, Rfl: 0 .  oxyCODONE-acetaminophen (PERCOCET) 5-325 MG tablet, Take 1 tablet by mouth every 4 (four) hours as needed. (Patient not taking: Reported on 11/09/2019), Disp: 20 tablet, Rfl: 0 .  varenicline (CHANTIX STARTING MONTH PAK) 0.5 MG X 11 & 1 MG X 42 tablet, Take one 0.5 mg tablet by mouth once daily for 3 days, then increase to one 0.5 mg tablet twice daily for 4 days, then increase to one 1 mg tablet twice daily., Disp: 53 tablet, Rfl: 0  Physical exam: There were no vitals filed for this visit. Limited secondary to telephone visit. Mrs. Bruyere was in no acute distress during our conversation.  CMP Latest Ref Rng & Units 11/06/2019  Glucose 70 - 99 mg/dL 099(I)  BUN 6 - 20 mg/dL 15  Creatinine 3.38 - 2.50 mg/dL 5.39  Sodium 767 - 341 mmol/L 139  Potassium 3.5 - 5.1 mmol/L 3.8  Chloride 98 - 111 mmol/L 104  CO2 22 - 32 mmol/L 24  Calcium 8.9 - 10.3 mg/dL 9.3(X)  Total Protein 6.5 - 8.1 g/dL 7.9  Total  Bilirubin 0.3 - 1.2 mg/dL 0.8  Alkaline Phos 38 - 126 U/L 67  AST 15 - 41 U/L 63(H)  ALT 0 - 44 U/L 36   CBC Latest Ref Rng & Units 11/06/2019  WBC 4.0 - 10.5 K/uL 9.7  Hemoglobin 12.0 - 15.0 g/dL 90.2  Hematocrit 36 - 46 % 40.7  Platelets 150 - 400 K/uL 220    No images are attached to the encounter.  US ABDOMEN LIMITED RUQ (LIVER/GB)  Result Date: 11/07/2019 CLINICAL DATA:  54 year old female with upper abdominal pain. EXAM: ULTRASOUND ABDOMEN LIMITED RIGHT UPPER QUADRANT COMPARISON:  None. FINDINGS: Gallbladder: There is a 15 mm gallstone. Mild thickened appearance of the gallbladder wall measuring 4 mm, likely partly related to partial contraction. No pericholecystic fluid. Negative sonographic Murphy's sign. Common bile duct: Diameter: 3 mm Liver: There is diffuse increased liver echogenicity most commonly seen in the setting of fatty infiltration. Superimposed inflammation or fibrosis is not excluded. Clinical correlation is recommended. Portal vein is patent on color Doppler imaging with normal direction of blood flow towards the liver. Other: None. IMPRESSION: 1. Cholelithiasis without sonographic evidence of acute cholecystitis. 2. Fatty liver. Electronically Signed   By: Elgie Collard M.D.   On: 11/07/2019 02:23     Assessment and plan- Patient is a 54 y.o. femalewho presents to smoking cessation clinic to discuss strategies, medications and interventions needed to quit smoking.  Topics covered: Change in daily routines Dealing with urges to smoke Getting support Anticipating/avoiding triggers Secondhand smoke Behavioral skills Reinforced benefits  Reviewed pharmacotherapy: OTC medications include:  Nicotine replacement therapy (NRT) gum  NRT lozenge  NRT patch  NRT patch plus combination of gum or lozenge  Medical treatment:  NRT nasal spray: Dosing 1-2 doses per hour (8-40 doses per day); 1 dose equals 1 spray in each nostril; each spray level 0.5 mg of  nicotine  NRT oral inhaler: Dosing 6-16 cartridges per day; initially use 1 cartridge every 1-2 hours  Bupropion SR: Dosing: Begin 1 to 2 weeks prior to quit date; 150 mg by mouth every a.m. x3 days, then increase to 150 mg by mouth twice daily.  Contraindications: Head injury,  seizures, eating disorders, MAOI inhibitor.  Verenicline: Dosing: Begin 1 week prior to quit date; days 1 through 3 0.5 mg by mouth every a.m.; days 4 through 7; 0.5 mg p.o. twice daily; weeks 2 through 12: 1 mg p.o. twice daily.  Monitor for neuropsychiatric symptoms.  Follow-up plan:  Based on our discussion today, you have decided to initiate Chantix. I have sent an prescription to [facility] to be covered by the [employer] d/t expense.   I also spoke with our social worker who recommends calling the Department of Social Services at [phone] and asking to speak to somebody about adult Medicaid.  I am not certain that this can be completed completely over the telephone but the paperwork can at least be mailed to you.  We discussed side effects and when to call your doctor:  -Signs of an allergic reaction  -Shortness of breath  -Feeling confused.  -Hallucinations  -Upset stomach/vomiting  -Skin reaction  -Sleepwalking. We also discussed minor side effects such as upset stomach or throwing up, odd dreams, gas, trouble sleeping, headaches, constipation, dry mouth, stomach pain, change in taste, feeling fatigued or tired or signs of a common cold.  Disposition: Return to clinic in about 1 month to assess tolerance of Chantix. Patient knows she can call clinic sooner if she has questions.  Visit Diagnosis 1. Personal history of tobacco use, presenting hazards to health   2. Tobacco use   3. Smoking trying to quit     Patient expressed understanding and was in agreement with this plan. She also understands that She can call clinic at any time with any questions, concerns, or complaints.   Greater than 50%  was spent in counseling and coordination of care with this patient including but not limited to discussion of the relevant topics above (See A&P) including, but not limited to diagnosis and management of acute and chronic medical conditions.   Thank you for allowing me to participate in the care of this very pleasant patient.    Mauro Kaufmann, NP [facility] Cell - [phone] Pager- [phone] 11/15/2019 2:59 PM

## 2019-11-15 ENCOUNTER — Telehealth: Payer: Self-pay | Admitting: Oncology

## 2019-11-15 MED ORDER — CHANTIX STARTING MONTH PAK 0.5 MG X 11 & 1 MG X 42 PO TABS
ORAL_TABLET | ORAL | 0 refills | Status: DC
Start: 2019-11-15 — End: 2019-11-15

## 2019-11-15 MED ORDER — CHANTIX STARTING MONTH PAK 0.5 MG X 11 & 1 MG X 42 PO TABS: ORAL_TABLET | ORAL | 0 refills | Status: DC

## 2019-11-15 NOTE — Progress Notes (Signed)
Cardiac Clearance has been received from Dr Kirke Corin. The patient is ok to hold her Aspirin 5-7 days prior to procedure. She is cleared at acceptable risk for surgery. Notes are in Epic.

## 2019-11-15 NOTE — Telephone Encounter (Signed)
   Primary Cardiologist: Lorine Bears, MD  Chart reviewed as part of pre-operative protocol coverage. Given that patient takes aspirin as primary prevention okay to hold 5-7 days prior to procedure or at the discretion of the Surgeon. Given past medical history and time since last visit, based on ACC/AHA guidelines, Tanya Hardin would be at acceptable risk for the planned procedure without further cardiovascular testing.   The patient was advised that if she develops new symptoms prior to surgery to contact our office to arrange for a follow-up visit, and she verbalized understanding.  I will route this recommendation to the requesting party via Epic fax function and remove from pre-op pool.  Please call with questions.  Khamari Sheehan David Stall, PA-C 11/15/2019, 3:19 PM

## 2019-11-15 NOTE — Telephone Encounter (Signed)
No need to repeat ischemic cardiac evaluation.  She can proceed at an overall low risk from a cardiac standpoint.

## 2019-11-16 ENCOUNTER — Other Ambulatory Visit: Payer: Self-pay | Admitting: Emergency Medicine

## 2019-11-16 ENCOUNTER — Telehealth: Payer: Self-pay | Admitting: Emergency Medicine

## 2019-11-16 DIAGNOSIS — J441 Chronic obstructive pulmonary disease with (acute) exacerbation: Secondary | ICD-10-CM

## 2019-11-16 NOTE — Telephone Encounter (Signed)
Referral placed to Pulmonology at this time. Patient made aware there office will contact her to schedule an appt. Pt advised to call office if she does not hear from them.

## 2019-11-16 NOTE — Telephone Encounter (Signed)
Called placed to Tanya Hardin (Patients PCP) to follow up on medical clearance. they told me Tanya Hardin won't clear pt until she is seen in office. I then called patient and told her what they told me and patient states "im not going because i need to pay 20 dollars that i do not have". Then proceeded telling me she has a new patient appt with Dr Tanya Hardin 11/19. I said okay i can wait and send a new clearance form to him. Patient then stops me and tells me that she is unsure if she wants to undergo surgery because of her COPD. She feels like she needs to see a Pulmonologist first. Message sent to Dr Tanya Hardin.  Per Dr Tanya Hardin, pulmonology referral to be placed.

## 2019-12-01 ENCOUNTER — Telehealth: Payer: Self-pay | Admitting: General Surgery

## 2019-12-01 NOTE — Telephone Encounter (Signed)
Laparoscopic Cholecystectomy 12/18/19. Patient had questions regarding diet. We discussed what to eat and things to avoid. She asked if her pain worsens what should she do. We discussed going to the emergency room if pain worsens.

## 2019-12-01 NOTE — Telephone Encounter (Signed)
Patient is calling and said she feels some pain said its at a  2 or 3 right now. Please call patient and advise.

## 2019-12-06 ENCOUNTER — Institutional Professional Consult (permissible substitution): Payer: Self-pay | Admitting: Pulmonary Disease

## 2019-12-08 ENCOUNTER — Encounter: Payer: Self-pay | Admitting: Family Medicine

## 2019-12-08 ENCOUNTER — Other Ambulatory Visit: Payer: Self-pay

## 2019-12-08 ENCOUNTER — Ambulatory Visit
Admission: RE | Admit: 2019-12-08 | Discharge: 2019-12-08 | Disposition: A | Discharge status: Home / Self Care | Payer: Self-pay | Source: Ambulatory Visit | Attending: Family Medicine | Admitting: Family Medicine

## 2019-12-08 ENCOUNTER — Ambulatory Visit
Admission: RE | Admit: 2019-12-08 | Discharge: 2019-12-08 | Disposition: A | Discharge status: Home / Self Care | Payer: Self-pay | Attending: Family Medicine | Admitting: Family Medicine

## 2019-12-08 ENCOUNTER — Encounter
Admission: RE | Admit: 2019-12-08 | Discharge: 2019-12-08 | Disposition: A | Discharge status: Home / Self Care | Payer: Self-pay | Source: Ambulatory Visit | Attending: General Surgery | Admitting: General Surgery

## 2019-12-08 ENCOUNTER — Ambulatory Visit (INDEPENDENT_AMBULATORY_CARE_PROVIDER_SITE_OTHER): Payer: Self-pay | Admitting: Family Medicine

## 2019-12-08 VITALS — BP 97/57 | HR 60 | Temp 97.5°F | Resp 16 | Ht 62.0 in | Wt 185.0 lb

## 2019-12-08 DIAGNOSIS — R0789 Other chest pain: Secondary | ICD-10-CM | POA: Insufficient documentation

## 2019-12-08 DIAGNOSIS — F331 Major depressive disorder, recurrent, moderate: Secondary | ICD-10-CM | POA: Insufficient documentation

## 2019-12-08 DIAGNOSIS — F411 Generalized anxiety disorder: Secondary | ICD-10-CM | POA: Insufficient documentation

## 2019-12-08 DIAGNOSIS — J432 Centrilobular emphysema: Secondary | ICD-10-CM

## 2019-12-08 DIAGNOSIS — Z01812 Encounter for preprocedural laboratory examination: Secondary | ICD-10-CM | POA: Insufficient documentation

## 2019-12-08 DIAGNOSIS — Z7689 Persons encountering health services in other specified circumstances: Secondary | ICD-10-CM

## 2019-12-08 DIAGNOSIS — Z720 Tobacco use: Secondary | ICD-10-CM

## 2019-12-08 HISTORY — DX: Dyspnea, unspecified: R06.00

## 2019-12-08 HISTORY — DX: Other specified postprocedural states: Z98.890

## 2019-12-08 HISTORY — DX: Gastro-esophageal reflux disease without esophagitis: K21.9

## 2019-12-08 HISTORY — DX: Other specified postprocedural states: R11.2

## 2019-12-08 HISTORY — DX: Other complications of anesthesia, initial encounter: T88.59XA

## 2019-12-08 HISTORY — DX: Anemia, unspecified: D64.9

## 2019-12-08 MED ORDER — FLUOXETINE HCL 10 MG PO TABS: 10.0000 mg | ORAL_TABLET | Freq: Every day | ORAL | 3 refills | Status: DC

## 2019-12-08 MED ORDER — BUPROPION HCL ER (SR) 150 MG PO TB12: 150.0000 mg | ORAL_TABLET | Freq: Two times a day (BID) | ORAL | 2 refills | Status: DC

## 2019-12-08 MED ORDER — ALBUTEROL SULFATE HFA 108 (90 BASE) MCG/ACT IN AERS: 2.0000 | INHALATION_SPRAY | Freq: Four times a day (QID) | RESPIRATORY_TRACT | 2 refills | Status: DC | PRN

## 2019-12-08 NOTE — Progress Notes (Signed)
Subjective:    Patient ID: Tanya Hardin, female    DOB: 1965/04/28, 54 y.o.   MRN: [id number]  Tanya Hardin is a 54 y.o. female presenting on 12/08/2019 for Establish Care (sternum pain or discomfort onset 4 month)  Previous PCP [facility] in [place].  changing jobs, trying to get medicaid  HPI    COPD Smoking Due for chest x-ray, she was referred to pulm but missed apt, she would need pre-op chest x-ray  Xiphoid pain Localized area of pain at xiphoid process, reproducible, prior history of same problem 12/2018, had prior x-ray sternum. Today request repeat. No other injury or trauma.  Upcoming gallbladder surgery end of month 11/2019. 11/29  GAD Major Depression recurrent mild PTSD history No confirmed dx of Bipolar. On Fluoxetine 10mg  daily currently doing well  Health Maintenance:  Future can arrange Mammogram and Colon screening when ready.   Depression screen PHQ 2/9 12/08/2019  Decreased Interest 1  Down, Depressed, Hopeless 1  PHQ - 2 Score 2  Altered sleeping 3  Tired, decreased energy 3  Change in appetite 1  Feeling bad or failure about yourself  3  Trouble concentrating 3  Moving slowly or fidgety/restless 1  Suicidal thoughts 0  PHQ-9 Score 16  Difficult doing work/chores Very difficult     GAD 7 : Generalized Anxiety Score 12/08/2019  Nervous, Anxious, on Edge 3  Control/stop worrying 3  Worry too much - different things 3  Trouble relaxing 3  Restless 3  Easily annoyed or irritable 3  Afraid - awful might happen 3  Total GAD 7 Score 21  Anxiety Difficulty Very difficult     Past Medical History:  Diagnosis Date  . Anxiety   . Chest pain    a. 05/2018 MV: EF 55-65%, small, mild, fixed apical ant and apical defect, most likely artifact. No ischemia-->Low risk.  06/2018 COPD (chronic obstructive pulmonary disease) (HCC)   . Depression   . Essential hypertension   . History of echocardiogram    a. 02/2015 Echo: Ef 60-65%. no rwma.  .  Hyperlipidemia   . PSVT (paroxysmal supraventricular tachycardia) (HCC)   . Tobacco abuse    Past Surgical History:  Procedure Laterality Date  . CESAREAN SECTION     Social History   Socioeconomic History  . Marital status: Single    Spouse name: Not on file  . Number of children: Not on file  . Years of education: Not on file  . Highest education level: Not on file  Occupational History  . Not on file  Tobacco Use  . Smoking status: Current Every Day Smoker    Packs/day: 1.50    Years: 30.00    Pack years: 45.00    Types: Cigarettes  . Smokeless tobacco: Current User  Vaping Use  . Vaping Use: Never used  Substance and Sexual Activity  . Alcohol use: Not Currently  . Drug use: Not Currently  . Sexual activity: Not on file  Other Topics Concern  . Not on file  Social History Narrative  . Not on file   Social Determinants of Health   Financial Resource Strain:   . Difficulty of Paying Living Expenses: Not on file  Food Insecurity:   . Worried About 03/2015 in the Last Year: Not on file  . Ran Out of Food in the Last Year: Not on file  Transportation Needs:   . Lack of Transportation (Medical): Not on file  .  Lack of Transportation (Non-Medical): Not on file  Physical Activity:   . Days of Exercise per Week: Not on file  . Minutes of Exercise per Session: Not on file  Stress:   . Feeling of Stress : Not on file  Social Connections:   . Frequency of Communication with Friends and Family: Not on file  . Frequency of Social Gatherings with Friends and Family: Not on file  . Attends Religious Services: Not on file  . Active Member of Clubs or Organizations: Not on file  . Attends BankerClub or Organization Meetings: Not on file  . Marital Status: Not on file  Intimate Partner Violence:   . Fear of Current or Ex-Partner: Not on file  . Emotionally Abused: Not on file  . Physically Abused: Not on file  . Sexually Abused: Not on file   Family History    Family history unknown: Yes   Current Outpatient Medications on File Prior to Visit  Medication Sig  . metoprolol tartrate (LOPRESSOR) 25 MG tablet TAKE 1 TABLET BY MOUTH TWICE A DAY   No current facility-administered medications on file prior to visit.    Review of Systems Per HPI unless specifically indicated above      Objective:    BP (!) 97/57   Pulse 60   Temp (!) 97.5 F (36.4 C) (Temporal)   Resp 16   Ht 5\' 2"  (1.575 m)   Wt 185 lb (83.9 kg)   LMP 06/27/2014   SpO2 98%   BMI 33.84 kg/m   Wt Readings from Last 3 Encounters:  12/08/19 185 lb (83.9 kg)  11/09/19 192 lb 12.8 oz (87.5 kg)  10/11/19 200 lb (90.7 kg)    Physical Exam Vitals and nursing note reviewed.  Constitutional:      General: She is not in acute distress.    Appearance: She is well-developed. She is not diaphoretic.     Comments: Well-appearing, comfortable, cooperative  HENT:     Head: Normocephalic and atraumatic.  Eyes:     General:        Right eye: No discharge.        Left eye: No discharge.     Conjunctiva/sclera: Conjunctivae normal.  Neck:     Thyroid: No thyromegaly.  Cardiovascular:     Rate and Rhythm: Normal rate and regular rhythm.     Heart sounds: Normal heart sounds. No murmur heard.   Pulmonary:     Effort: Pulmonary effort is normal. No respiratory distress.     Breath sounds: Normal breath sounds. No wheezing or rales.  Chest:     Chest wall: Tenderness (localized over xiphoid) present.  Musculoskeletal:        General: Normal range of motion.     Cervical back: Normal range of motion and neck supple.  Lymphadenopathy:     Cervical: No cervical adenopathy.  Skin:    General: Skin is warm and dry.     Findings: No erythema or rash.  Neurological:     Mental Status: She is alert and oriented to person, place, and time.  Psychiatric:        Behavior: Behavior normal.     Comments: Well groomed, good eye contact, normal speech and thoughts       Miscellaneous Results - documented in this encounter  Spirometry (09/01/2018 12:10 PM EDT) Spirometry (09/01/2018 12:10 PM EDT)  Component Value Ref Range Performed At Pathologist Signature  FVC PRE 2.58 2.42 - 3.85 L [facility]  FEV1 PRE 1.81 (L) 1.93 - 3.08 L [facility]   FEV1/FVC PRE 70.14 68.81 - 91.74 % [facility]   FEV6 PRE 2.49 (L) 2.50 - 3.78 L [facility]   FEV1/FEV6 PRE 72.62 (L) 72.87 - 90.47 % [facility]   FEF25-75% PRE 1.09 (L) 1.32 - 3.56 L/s [facility]   NIDPOE42-35 PRE 1.09 L/s [facility]   FEF50% PRE 1.84 (L) 1.87 - 5.49 L/s [facility]   PEF PRE 5.64 4.69 - 7.93 L/s [facility]   FET100% Change 7.84 sec [facility]   FIVC PRE 1.44 (L) 2.42 - 3.85 L [facility]   FIF50% PRED 0.90 L/s [facility]   TIR/WER15 pre 203.55 % [facility]   PIF PRE 1.52 (L) 2.58 - 7.38 L/s [facility]   Vol extrap pre 0.03 L [facility]      XR Sternum PA And Lateral  Impression Performed by [facility] No acute osseous abnormality. Narrative Performed by [facility] EXAM: XR STERNUM PA AND LATERAL  DATE: 01/02/2019 1:43 PM  ACCESSION: [id number] UN  DICTATED: 01/02/2019 4:33 PM  INTERPRETATION LOCATION: [facility]   CLINICAL INDICATION: 54 year old Female with xiphodynia - R07.89-Xiphodynia    COMPARISON: None.   TECHNIQUE: Lateral and right anterior oblique views of the sternum.   FINDINGS:  No acute fracture. No sclerotic or lytic osseous lesions. Tiny calcification overlying the anterior lower chest wall, nonspecific. No radiopaque foreign bodies.   Visualized portions of the lungs and mediastinum are unremarkable. Procedure Note  Interface, Rad Results In - 01/02/2019 8:11 PM EST  Formatting of this note might be different from the original.  EXAM: XR STERNUM PA AND LATERAL  DATE: 01/02/2019 1:43 PM  ACCESSION: [id number] UN  DICTATED: 01/02/2019 4:33 PM  INTERPRETATION LOCATION: [facility]   CLINICAL INDICATION: 54 year old Female with xiphodynia - R07.89-Xiphodynia    COMPARISON: None.    TECHNIQUE: Lateral and right anterior oblique views of the sternum.   FINDINGS:  No acute fracture. No sclerotic or lytic osseous lesions. Tiny calcification overlying the anterior lower chest wall, nonspecific. No radiopaque foreign bodies.   Visualized portions of the lungs and mediastinum are unremarkable.   IMPRESSION:  No acute osseous abnormality. Specimen Collected: 01/02/19 4:33 PM Last Resulted: 01/02/19 8:11 PM  Received From: [facility]  Result Received: 11/14/19 8:47 AM     Results for orders placed or performed during the hospital encounter of 11/07/19  Lipase, blood  Result Value Ref Range   Lipase 32 11 - 51 U/L  Comprehensive metabolic panel  Result Value Ref Range   Sodium 139 135 - 145 mmol/L   Potassium 3.8 3.5 - 5.1 mmol/L   Chloride 104 98 - 111 mmol/L   CO2 24 22 - 32 mmol/L   Glucose, Bld 123 (H) 70 - 99 mg/dL   BUN 15 6 - 20 mg/dL   Creatinine, Ser 9.98 0.44 - 1.00 mg/dL   Calcium 8.7 (L) 8.9 - 10.3 mg/dL   Total Protein 7.9 6.5 - 8.1 g/dL   Albumin 3.7 3.5 - 5.0 g/dL   AST 63 (H) 15 - 41 U/L   ALT 36 0 - 44 U/L   Alkaline Phosphatase 67 38 - 126 U/L   Total Bilirubin 0.8 0.3 - 1.2 mg/dL   GFR, Estimated >33 >82 mL/min   Anion gap 11 5 - 15  CBC  Result Value Ref Range   WBC 9.7 4.0 - 10.5 K/uL   RBC 4.64  3.87 - 5.11 MIL/uL   Hemoglobin 14.2 12.0 - 15.0 g/dL   HCT 41.9 36 - 46 %   MCV 87.7 80.0 - 100.0 fL   MCH 30.6 26.0 - 34.0 pg   MCHC 34.9 30.0 - 36.0 g/dL   RDW 62.2 29.7 - 98.9 %   Platelets 220 150 - 400 K/uL   nRBC 0.0 0.0 - 0.2 %  Urinalysis, Complete w Microscopic  Result Value Ref Range   Color, Urine AMBER (A) YELLOW   APPearance CLOUDY (A) CLEAR   Specific Gravity, Urine 1.026 1.005 - 1.030   pH 5.0 5.0 - 8.0   Glucose, UA NEGATIVE NEGATIVE mg/dL   Hgb urine dipstick NEGATIVE NEGATIVE   Bilirubin Urine NEGATIVE NEGATIVE   Ketones, ur NEGATIVE NEGATIVE mg/dL   Protein, ur 30 (A) NEGATIVE mg/dL   Nitrite NEGATIVE  NEGATIVE   Leukocytes,Ua NEGATIVE NEGATIVE   RBC / HPF 0-5 0 - 5 RBC/hpf   WBC, UA 6-10 0 - 5 WBC/hpf   Bacteria, UA NONE SEEN NONE SEEN   Squamous Epithelial / LPF 11-20 0 - 5   Mucus PRESENT    Ca Oxalate Crys, UA PRESENT   Troponin I (High Sensitivity)  Result Value Ref Range   Troponin I (High Sensitivity) <2 <18 ng/L  Troponin I (High Sensitivity)  Result Value Ref Range   Troponin I (High Sensitivity) 3 <18 ng/L      Assessment & Plan:   Problem List Items Addressed This Visit    Xiphoid pain   Relevant Orders   DG Sternum   Major depressive disorder, recurrent, moderate (HCC)   Relevant Medications   buPROPion (WELLBUTRIN SR) 150 MG 12 hr tablet   FLUoxetine (PROZAC) 10 MG tablet   GAD (generalized anxiety disorder)   Relevant Medications   buPROPion (WELLBUTRIN SR) 150 MG 12 hr tablet   FLUoxetine (PROZAC) 10 MG tablet   Centrilobular emphysema (HCC) - Primary   Relevant Medications   albuterol (VENTOLIN HFA) 108 (90 Base) MCG/ACT inhaler   Other Relevant Orders   DG Chest 2 View    Other Visit Diagnoses    Encounter to establish care with new doctor       Tobacco abuse       Relevant Medications   buPROPion (WELLBUTRIN SR) 150 MG 12 hr tablet      #Tobacco abuse Nicotine dependence Plan: 1. Start Wellbutrin SR 150mg  - start daily in AM for 3 days, then BID (8 hrs after 1st dose) avoid late afternoon/evening dosing to reduce insomnia / dry mouth. Given 1 month supply. Set quit date within 1-2 weeks, can continue smoking for now. Counseled on risks/benefits side effects of medication including mood disruptions and resulting behavior, advised to stop if any significant mood side effect. 2. Follow-up 4 weeks, monitor progress, if successful can continue up to max 12 weeks, if no improvement after 7 weeks will discontinue. Consider NRT patches vs gum.  #Xiphoid pain Check Sternum X-ray today, later results reviewed, negative unable to fully visualize xiphoid can  do CT in future.  #Anxiety Depression Continue Fluoxetine Add Buproprion  #Emphysema COPD Smoking history, pursue quit smoking Trial on Stilto respimat inhaler today 2-4 week supply given, follow up if has insurance coverage soon can order maintenance therapy May reschedule Pulm apt as planned  CXR with mild interstitial prominence of lungs generalized, stable from prior testing, no acute abnormality. Reviewed okay to proceed with lap surgery as planned, she has pulmonology apt in [place]  that was re-scheduled, likely needs future CT imaging of lungs and maybe repeat PFT, continue inhaler therapy in future.  Meds ordered this encounter  Medications  . albuterol (VENTOLIN HFA) 108 (90 Base) MCG/ACT inhaler    Sig: Inhale 2 puffs into the lungs every 6 (six) hours as needed for wheezing or shortness of breath.    Dispense:  1 each    Refill:  2  . buPROPion (WELLBUTRIN SR) 150 MG 12 hr tablet    Sig: Take 1 tablet (150 mg total) by mouth 2 (two) times daily.    Dispense:  60 tablet    Refill:  2  . FLUoxetine (PROZAC) 10 MG tablet    Sig: Take 1 tablet (10 mg total) by mouth daily.    Dispense:  30 tablet    Refill:  3      Follow up plan: Return in about 3 months (around 03/09/2020) for 3 month follow-up Mood Anxiety PHQ, COPD, sooner if needed.  Saralyn Pilar, DO [facility] 12/08/2019, 10:22 AM

## 2019-12-08 NOTE — Progress Notes (Addendum)
  [facility] Perioperative Services: Pre-Admission/Anesthesia Testing     Date: 12/08/19  Name: Tanya Hardin MRN:   [id number]  Re: Follow up on patient request for pre-surgical pulmonary medicine consult  Patient scheduled for cholecystectomy on 12/18/2019 with Dr. Duanne Guess. In review of the documentation from surgeon's office, it appears as if patient has requested to be seen by PCCM prior to having her surgery citing worsening DOE/SOB in the setting of known COPD. Patient was scheduled to be seen on 12/06/2019, however she was a no show for her scheduled appointment. I spoke with staff in PCCM office today and learned that when patient was called by practice to determine plans, she advised that she would like to reschedule after her surgery. Patient is currently scheduled to be seen in consult by Dr. Sarina Ser on 02/10/2020.  I will reach out to Dr. Lady Gary to solicit her thoughts on patient needing to be seen by pulmonary medicine prior to her procedure. Patient has been cleared by cardiology at this point.  Additionally, patient is being seen by new PCP today (12/08/2019). Note pending, however is does appear as if patient was prescribed a SABA (albuterol) MDI for PRN use for her COPD.   Quentin Mulling, MSN, APRN, FNP-C, CEN [facility]  Peri-operative Services Nurse Practitioner Phone: [phone] 12/08/19 3:41 PM

## 2019-12-08 NOTE — Progress Notes (Signed)
Request for Medical Clearance has been faxed to Dr Karamalegos's office.  

## 2019-12-08 NOTE — Pre-Procedure Instructions (Signed)
Iran Ouch, MD  Physician  Cardiology  Telephone Encounter  Signed  Encounter Date:  11/13/2019          Signed       Show:Clear all [x] Manual[] Template[] Copied  Added by: [x] , MD  [] Hover for details No need to repeat ischemic cardiac evaluation.  She can proceed at an overall low risk from a cardiac standpoint.        Electronically signed by , MD at 11/15/2019 2:32 PM  Telephone on 11/13/2019   Telephone on 11/13/2019     Detailed Report

## 2019-12-08 NOTE — Pre-Procedure Instructions (Signed)
Corrin Parker, PA-C  Physician Assistant Certified  Cardiology  Telephone Encounter  Signed  Encounter Date:  11/13/2019          Signed       Show:Clear all [x] Manual[] Template[] Copied  Added by: [x] , PA-C  [] Hover for details ,  Tanya Hardin is scheduled for a robotic-assisted laparoscopic cholecystectomy (like 12/18/2019). She has a history of chest pain with low risk Myoview in 05/2018 paroxysmal SVT, COPD, and tobacco abuse. Prior Echo in 02/2015 showed normal EF with no wall motion abnormalities. In 05/2018, she was experiencing worsening dyspnea so Myoview was ordered and was low risk (there was question of a small/mild fixed apical anterior and apical defect which was felt to mostly like represent artifact. She was recently seen by 06/2018 on 09/21/2019 after recent ED visit for epigastric pain with associated vomiting. EKG was non-acute and high-sensitivity troponin was normal x2. At visit with 06/2018, symptoms were felt to be GI related and not cardiac in nature. She ended up being diagnosed with cholelithiasis and lap chole has been scheduled.  I called and spoke with patient today. She denies any chest pain at rest or with exertion. Epigastric pain has improved with diet changes. She does note pretty significant dyspnea on exertion though that she reports has worsened. She states we she walks up step, she gets significantly short of breath and has pounding heart rate. She gets so short of breath that she feels like she needs a breathing treatment or needs to call 911 for O2. No She thinks this is due to worsening COPD as she is a heavy smoker. No significant dyspnea at rest although sounds like she probably has undiagnosed sleep apnea. No lower extremity edema. I think dyspnea is multifactorial and due to COPD, obesity, and deconditioning. I really do not think this due to CHF or anginal equivalent. However, given significant dyspnea, do you feel like  she needs repeat ischemic evaluation (stress test vs coronary CTA) or Echo prior to surgery.  Please route response to P CV DIV PREOP.  Thank you! Callie        Electronically signed by Ward Givens, PA-C at 11/13/2019 5:36 PM  Telephone on 11/13/2019   Telephone on 11/13/2019     Detailed Report

## 2019-12-08 NOTE — Patient Instructions (Addendum)
Thank you for coming to the office today.   [facility] Address: [address] [place], [place], [place] [address] Phone: [phone]  ---------------  Chest x-ray and sternum x-ray today.  Try the Diclofenac (Voltaren) gel OTC for anti inflammatory at your Xiphoid tip of your chest bone use 2-4 times a day as needed for pain.  Try COPD inhaler for now to see if effective. Breztri - 2 puff twice a day.  We will try to arrange a CT scan of lungs as well if possible for smoking history and screening.  Albuterol inhaler refilled.  Keep on Fluoxetine we can adjust dose in future  For your smoking cessation, here is the plan: - Start Wellbutrin 150mg  tablets - take 1 tab daily for 3 days - Then, start taking 1 tab TWICE daily (about every 8 hours - do not take late before bed). Continue this for about 8 to 12 weeks - To quit smoking - wait to start this treatment until you are mentally ready to quit. - Choose a quit date in the future. Start taking the medicine about 1-2 weeks away from your quit date. Reduce the number of cigarettes daily until you eventually QUIT completely on your quit date. Continue taking the Wellbutrin twice daily for total 7 weeks, we may continue for longer.   Please schedule a Follow-up Appointment to: Return in about 3 months (around 03/09/2020) for 3 month follow-up Mood Anxiety PHQ, COPD, sooner if needed.  If you have any other questions or concerns, please feel free to call the office or send a message through MyChart. You may also schedule an earlier appointment if necessary.  Additionally, you may be receiving a survey about your experience at our office within a few days to 1 week by e-mail or mail. We value your feedback.  03/11/2020, DO [facility], [facility]

## 2019-12-08 NOTE — Patient Instructions (Signed)
Your procedure is scheduled on:12-18-19 MONDAY Report to the Registration Desk on the 1st floor of the Medical Mall-Then Proceed to the 2nd floor and check in at the Surgery Desk To find out your arrival time, please call [phone] between 1PM - 3PM on:12-15-19 FRIDAY  REMEMBER: Instructions that are not followed completely may result in serious medical risk, up to and including death; or upon the discretion of your surgeon and anesthesiologist your surgery may need to be rescheduled.  Do not eat food after midnight the night before surgery.  No gum chewing, lozengers or hard candies.  You may however, drink CLEAR liquids up to 2 hours before you are scheduled to arrive for your surgery. Do not drink anything within 2 hours of your scheduled arrival time.  Clear liquids include: - water  - apple juice without pulp - gatorade (not RED, PURPLE, OR BLUE) - black coffee or tea (Do NOT add milk or creamers to the coffee or tea) Do NOT drink anything that is not on this list.  TAKE THESE MEDICATIONS THE MORNING OF SURGERY WITH A SIP OF WATER: -PROZAC (FLUOXETINE) -METOPROLOL (TOPROL) -IF YOU START YOUR NEW MEDICATION (BUPROPION-WELLBUTRIN) BEFORE YOUR SURGERY, THEN TAKE IT THE MORNING OF YOUR SURGERY  Use inhalers on the day of surgery and bring to the hospital-USE YOUR ALBUTEROL INHALER THE MORNING OF SURGERY AND BRING YOUR INHALER TO THE HOSPITAL  One week prior to surgery: Stop Anti-inflammatories (NSAIDS) such as Advil, Aleve, Ibuprofen, Motrin, Naproxen, Naprosyn and Aspirin based products such as Excedrin, Goodys Powder, BC Powder-OK TO TAKE TYLENOL IF NEEDED  Stop ANY OVER THE COUNTER supplements until after surgery.  No Alcohol for 24 hours before or after surgery.  No Smoking including e-cigarettes for 24 hours prior to surgery.  No chewable tobacco products for at least 6 hours prior to surgery.  No nicotine patches on the day of surgery.  Do not use any "recreational"  drugs for at least a week prior to your surgery.  Please be advised that the combination of cocaine and anesthesia may have negative outcomes, up to and including death. If you test positive for cocaine, your surgery will be cancelled.  On the morning of surgery brush your teeth with toothpaste and water, you may rinse your mouth with mouthwash if you wish. Do not swallow any toothpaste or mouthwash.  Do not wear jewelry, make-up, hairpins, clips or nail polish.  Do not wear lotions, powders, or perfumes.   Do not shave body from the neck down 48 hours prior to surgery just in case you cut yourself which could leave a site for infection.  Also, freshly shaved skin may become irritated if using the CHG soap.  Contact lenses, hearing aids and dentures may not be worn into surgery.  Do not bring valuables to the hospital. [facility] is not responsible for any missing/lost belongings or valuables.   Use CHG Soap as directed on instruction sheet.  Notify your doctor if there is any change in your medical condition (cold, fever, infection).  Wear comfortable clothing (specific to your surgery type) to the hospital.  Plan for stool softeners for home use; pain medications have a tendency to cause constipation. You can also help prevent constipation by eating foods high in fiber such as fruits and vegetables and drinking plenty of fluids as your diet allows.  After surgery, you can help prevent lung complications by doing breathing exercises.  Take deep breaths and cough every 1-2 hours. Your doctor  may order a device called an Incentive Spirometer to help you take deep breaths. When coughing or sneezing, hold a pillow firmly against your incision with both hands. This is called "splinting." Doing this helps protect your incision. It also decreases belly discomfort.  If you are being admitted to the hospital overnight, leave your suitcase in the car. After surgery it may be brought to your  room.  If you are being discharged the day of surgery, you will not be allowed to drive home. You will need a responsible adult (18 years or older) to drive you home and stay with you that night.   If you are taking public transportation, you will need to have a responsible adult (18 years or older) with you. Please confirm with your physician that it is acceptable to use public transportation.   Please call the Pre-admissions Testing Dept. at [phone] if you have any questions about these instructions.  Visitation Policy:  Patients undergoing a surgery or procedure may have one family member or support person with them as long as that person is not COVID-19 positive or experiencing its symptoms.  That person may remain in the waiting area during the procedure.  Inpatient Visitation Update:   In an effort to ensure the safety of our team members and our patients, we are implementing a change to our visitation policy:  Effective Monday, Aug. 9, at 7 a.m., inpatients will be allowed one support person.  o The support person may change daily.  o The support person must pass our screening, gel in and out, and wear a mask at all times, including in the patient's room.  o Patients must also wear a mask when staff or their support person are in the room.  o Masking is required regardless of vaccination status.  Systemwide, no visitors 17 or younger.

## 2019-12-11 ENCOUNTER — Telehealth: Payer: Self-pay | Admitting: General Surgery

## 2019-12-11 ENCOUNTER — Telehealth: Payer: Self-pay | Admitting: Pulmonary Disease

## 2019-12-11 NOTE — Telephone Encounter (Signed)
Spoke to [place] with [facility] and relayed below message.  She voiced her understanding and had no further questions.

## 2019-12-11 NOTE — Telephone Encounter (Signed)
Called the patient, she is still on board with proceeding surgery as scheduled for 12/18/19.  She is reminded to get her Covid test done on 11/26.

## 2019-12-11 NOTE — Telephone Encounter (Addendum)
Called and spoke to [place] with [facility]. Velna Hatchet is requesting sooner appt then 02/09/2019 for surgical clearance. Patient is scheduled for gallbladder surgery on 12/18/2019 Patient was scheduled for consult on 12/06/2019 and she no showed this visit due to transportation issues.  Velna Hatchet is aware that we do not have sooner availability in [place]. I offered [place] office, however Velna Hatchet did not think this would work for patient due to transportation.   Dr. Jayme Cloud, please advise. Thanks

## 2019-12-11 NOTE — Telephone Encounter (Signed)
Received call from the patient.  She is scheduled for surgery with Dr. Lady Gary on 12/18/19.  She has a lot of other health issues and concerns.  She was suppose to go to pulmonology but didn't have a ride, so has missed that appointment.  She is pending clearance and has a lot of concerns and would like for a nurse to call her please.  Thank you.

## 2019-12-11 NOTE — Telephone Encounter (Signed)
Unfortunately because of coverage needs in the ICU and the holiday availability this week is limited unfortunately patient did not show to her appointment as scheduled on the 17th. Choice would be to go to [facility] for evaluation at our office there. She is a new patient to [place] and would need a full consult.

## 2019-12-11 NOTE — Progress Notes (Addendum)
[facility] Perioperative Services  Pre-Admission/Anesthesia Testing Clinical Review  Date: 12/11/19  Patient Demographics:  Name: Tanya Hardin DOB:   1965-02-24 MRN:   [id number]  Planned Surgical Procedure(s):    Case: [id number] Date/Time: 12/18/19 0715   Procedure: XI ROBOTIC ASSISTED LAPAROSCOPIC CHOLECYSTECTOMY (N/A )   Anesthesia type: General   Pre-op diagnosis: Sx cholelithiasis   Location: [facility] OR ROOM 04 / [facility] ORS FOR ANESTHESIA GROUP   Surgeons: Duanne Guess, MD     NOTE: Available PAT nursing documentation and vital signs have been reviewed. Clinical nursing staff has updated patient's PMH/PSHx, current medication list, and drug allergies/intolerances to ensure comprehensive history available to assist in medical decision making as it pertains to the aforementioned surgical procedure and anticipated anesthetic course.   Clinical Discussion:  Tanya Hardin is a 54 y.o. female who is submitted for pre-surgical anesthesia review and clearance prior to her undergoing the above procedure. Patient is a Current Smoker (45 pack years). Pertinent PMH includes: PSVT, angina, HTN, HLD, COPD, GERD (on daily PPI), anemia, depression, anxiety.  Patient is followed by cardiology Kirke Corin, MD). She was last seen in the cardiology clinic on 09/21/2019; notes reviewed.  At the time of her last clinic visit, patient had been doing "reasonably well until about a week ago".  Patient reported a brief episode of palpitations; patient self treated at home with an extra dose of her metoprolol.  Patient complained of epigastric and lower abdominal discomfort associated with reflux, nausea, and a single episode of vomiting.  Patient presented to the ED on 09/16/2019, however it appears as if she left prior to being seen.  Negative testing while waiting to be seen included serial troponin x 2 (-), a normal chest x-ray, and ECG that revealed normal sinus rhythm.  Patient noted  that after extended wait, she decided to leave.  Recurrent symptoms when supine over the course of the next 2 days, which prompted her to call EMS on 09/19/2019.  EMS arrived and performed an ECG that was normal, therefore refused transport to the hospital.  Since that time, patient has started taking OTC H2 blocker and PPI, which have helped to improve her symptoms.  Patient denied any exertional chest pain, shortness of breath, palpitations, PND, orthopnea, vertiginous symptoms, peripheral edema, or presyncope/syncope.  Last TTE performed in 02/2015 revealed normal left ventricular systolic function; LVEF 60-65%.  Myocardial perfusion imaging study performed on 06/07/2018 revealed a small in size, mild in severity, fixed apical anterior and apical defect most likely representing artifact, no significant ischemia, and an LVEF of 55-65% (see full interpretation of cardiovascular testing below).  Blood pressure elevated at 150/80 on beta-blocker (metoprolol) monotherapy.  Of note, it was discovered the patient had only been taking 25 mg once daily rather than twice daily as prescribed; encouraged to increase to twice daily.  Patient scheduled to follow-up with outpatient cardiology in 4 to 6 months or sooner if necessary.  Since being seen in the cardiology clinic, patient has had multiple visits to the ED as follows:    Patient presented to the ED at [facility] on 10/06/2019 with complaints of RIGHT sided chest pain that radiates into the RUQ of her abdomen.  Patient had symptoms x3 days.  No nausea, vomiting, or changes to her bowel habits.  Labs once again negative.  ECG normal.  Patient once again left without being seen by ED provider.   Patient presented again to the ED on 10/11/2019 with complaints of RIGHT  sided abdominal pain with associated nausea and vomiting x2 hours.  Patient denied diarrhea.  Labs negative.  Patient left the ED again without being seen by an ED provider.   Patient presented to the  ED via EMS on 11/06/2019 with complaints of epigastric pain and diarrhea x 3 months.  Symptoms that day started after patient ate chili; pain 10/10.  AST elevated at 60 3U/L; ALT, ALP, and total bilirubin all normal.  No leukocytosis noted on CBC.  High-sensitivity troponin x 2 were (-).  Ultrasound imaging of her right upper quadrant revealed cholelithiasis without sonographic evidence of acute cholecystitis.  Patient pain controlled in the ED she was ultimately discharged home to follow-up with outpatient general surgery.  Patient referred to Dr. Duanne Guess.  Patient is scheduled to undergo a robotic assisted laparoscopic cholecystectomy on 12/18/2019 with Dr. Duanne Guess.  Given patient's cardiopulmonary history, presurgical cardiac and internal medicine clearance has been sought by the attending surgeons office.  Of note, during her visit with Dr. Lady Gary patient advised medical staff that she was unsure if she wanted to proceed with surgery due to her COPD.  Patient stated that she felt like she needed to see a pulmonologist prior to surgery.  Patient was referred to Dr. Sarina Ser and an appointment was scheduled for 12/06/2019, however ultimately was a no-show for her appointment.  In speaking with the pulmonology office, it was determined the patient had transportation issues and asked to reschedule.  Consult was rescheduled for 02/10/2020 with Dr. Jayme Cloud.  I reached out to Dr. Lady Gary on 12/08/2019; see notes regarding need for pulmonary clearance. Patient did return a call to her surgeon's office on 12/11/2019 advising that she has "a lot of other health issues and concerns" and would like to proceed with the ordered pulmonology clearance in efforts to be able to have her procedure on 12/18/2019 as scheduled. Surgeon's office in contact with the pulmonology at this time to try to expedite the consult; awaiting disposition at this time.   Speciality clearances that have been issued thus  far are as follows:   Per cardiology, "based on ACC/AHA guidelines, Neomia Herbel would be at acceptable risk for the planned procedure without further cardiovascular testing".  This patient is on daily antiplatelet therapy. She has been instructed on recommendations for holding her daily low-dose ASA for 5-7 days prior to her procedure.     Patient was also seen in consult on 12/08/2019 by Dr. Saralyn Pilar to establish care.  She was sent for diagnostic imaging of her chest and sternum.  Patient was started on SABA inhaler for her COPD.  PCP advising that patient will likely need future CT imaging of the lungs and repeat PFTs to evaluate her breathing in the future. Internal medicine provider advising that patient may proceed with planned laparoscopic surgery at this point.  She reports previous perioperative complications with anesthesia.  Patient reporting that during a cesarean section she became hypotensive and experienced nausea and vomiting.  Nausea and vomiting extended into the postoperative period; (+) PONV.   Vitals with BMI 12/08/2019 11/09/2019 11/07/2019  Height 5\' 2"  5\' 2"  -  Weight 185 lbs 192 lbs 13 oz -  BMI 33.83 35.25 -  Systolic 97 127 121  Diastolic 57 79 73  Pulse 60 76 78    Providers/Specialists:   NOTE: Primary physician provider listed below. Patient may have been seen by APP or partner within same practice.   PROVIDER ROLE LAST ,  MD General Surgery  11/09/2019  Smitty Cords, DO Primary Care Provider  11/07/2019  Lorine Bears, MD Cardiology  09/21/2019  Sarina Ser, MD Pulmonary Medicine  PENDING    Allergies:  Codeine  Current Home Medications:   No current facility-administered medications for this encounter.   . metoprolol tartrate (LOPRESSOR) 25 MG tablet  . albuterol (VENTOLIN HFA) 108 (90 Base) MCG/ACT inhaler  . buPROPion (WELLBUTRIN SR) 150 MG 12 hr tablet  . FLUoxetine (PROZAC) 10 MG tablet    History:   Past Medical History:  Diagnosis Date  . Anemia    h/o   . Anxiety   . Chest pain    a. 05/2018 MV: EF 55-65%, small, mild, fixed apical ant and apical defect, most likely artifact. No ischemia-->Low risk.  . Complication of anesthesia    DURING SECTION, BP DROPPED /N/V  . COPD (chronic obstructive pulmonary disease) (HCC)   . Depression   . Dyspnea    DUE TO COPD  . Essential hypertension   . GERD (gastroesophageal reflux disease)    due to gallbladder  . History of echocardiogram    a. 02/2015 Echo: Ef 60-65%. no rwma.  . Hyperlipidemia   . PONV (postoperative nausea and vomiting)    X1 ONLY DURING C SECTION  . PSVT (paroxysmal supraventricular tachycardia) (HCC)   . Tobacco abuse    Past Surgical History:  Procedure Laterality Date  . CESAREAN SECTION     x2  . MOUTH SURGERY     Family History  Family history unknown: Yes   Social History   Tobacco Use  . Smoking status: Current Every Day Smoker    Packs/day: 1.50    Years: 30.00    Pack years: 45.00    Types: Cigarettes  . Smokeless tobacco: Never Used  Vaping Use  . Vaping Use: Never used  Substance Use Topics  . Alcohol use: Not Currently  . Drug use: Not Currently    Comment: LAST USED WHILE IN HER 20'S    Pertinent Clinical Results:  LABS: Labs reviewed: Acceptable for surgery.  No visits with results within 3 Day(s) from this visit.  Latest known visit with results is:  Admission on 11/07/2019, Discharged on 11/07/2019  Component Date Value Ref Range Status  . Lipase 11/06/2019 32  11 - 51 U/L Final   Performed at [facility], [address] [place], [place] [address]  . Sodium 11/06/2019 139  135 - 145 mmol/L Final  . Potassium 11/06/2019 3.8  3.5 - 5.1 mmol/L Final   HEMOLYSIS AT THIS LEVEL MAY AFFECT RESULT  . Chloride 11/06/2019 104  98 - 111 mmol/L Final  . CO2 11/06/2019 24  22 - 32 mmol/L Final  . Glucose, Bld 11/06/2019 123* 70 - 99 mg/dL Final   Glucose  reference range applies only to samples taken after fasting for at least 8 hours.  . BUN 11/06/2019 15  6 - 20 mg/dL Final  . Creatinine, Ser 11/06/2019 0.74  0.44 - 1.00 mg/dL Final  . Calcium 16/38/4665 8.7* 8.9 - 10.3 mg/dL Final  . Total Protein 11/06/2019 7.9  6.5 - 8.1 g/dL Final  . Albumin 99/35/7017 3.7  3.5 - 5.0 g/dL Final  . AST 79/39/0300 63* 15 - 41 U/L Final  . ALT 11/06/2019 36  0 - 44 U/L Final  . Alkaline Phosphatase 11/06/2019 67  38 - 126 U/L Final  . Total Bilirubin 11/06/2019 0.8  0.3 - 1.2 mg/dL Final  .  GFR, Estimated 11/06/2019 >60  >60 mL/min Final  . Anion gap 11/06/2019 11  5 - 15 Final   Performed at [facility], [address] [place] [place], [place] [address]  . WBC 11/06/2019 9.7  4.0 - 10.5 K/uL Final  . RBC 11/06/2019 4.64  3.87 - 5.11 MIL/uL Final  . Hemoglobin 11/06/2019 14.2  12.0 - 15.0 g/dL Final  . HCT 60/45/4098 40.7  36 - 46 % Final  . MCV 11/06/2019 87.7  80.0 - 100.0 fL Final  . MCH 11/06/2019 30.6  26.0 - 34.0 pg Final  . MCHC 11/06/2019 34.9  30.0 - 36.0 g/dL Final  . RDW 11/91/4782 11.9  11.5 - 15.5 % Final  . Platelets 11/06/2019 220  150 - 400 K/uL Final  . nRBC 11/06/2019 0.0  0.0 - 0.2 % Final   Performed at [facility], [address] [place], [place] [address]  . Color, Urine 11/06/2019 AMBER* YELLOW Final   BIOCHEMICALS MAY BE AFFECTED BY COLOR  . APPearance 11/06/2019 CLOUDY* CLEAR Final  . Specific Gravity, Urine 11/06/2019 1.026  1.005 - 1.030 Final  . pH 11/06/2019 5.0  5.0 - 8.0 Final  . Glucose, UA 11/06/2019 NEGATIVE  NEGATIVE mg/dL Final  . Hgb urine dipstick 11/06/2019 NEGATIVE  NEGATIVE Final  . Bilirubin Urine 11/06/2019 NEGATIVE  NEGATIVE Final  . Ketones, ur 11/06/2019 NEGATIVE  NEGATIVE mg/dL Final  . Protein, ur 30/86/5784 30* NEGATIVE mg/dL Final  . Nitrite 69/62/9528 NEGATIVE  NEGATIVE Final  . Glori Luis 11/06/2019 NEGATIVE  NEGATIVE Final  . RBC / HPF 11/06/2019 0-5  0 - 5 RBC/hpf Final  .  WBC, UA 11/06/2019 6-10  0 - 5 WBC/hpf Final  . Bacteria, UA 11/06/2019 NONE SEEN  NONE SEEN Final  . Squamous Epithelial / LPF 11/06/2019 11-20  0 - 5 Final  . Mucus 11/06/2019 PRESENT   Final  . Ca Oxalate Crys, UA 11/06/2019 PRESENT   Final   Performed at [facility], [address] [place], [place] [address]  . Troponin I (High Sensitivity) 11/06/2019 <2  <18 ng/L Final   Comment: (NOTE) Elevated high sensitivity troponin I (hsTnI) values and significant  changes across serial measurements may suggest ACS but many other  chronic and acute conditions are known to elevate hsTnI results.  Refer to the "Links" section for chest pain algorithms and additional  guidance. Performed at [facility], [address], [place], [place] [address]   . Troponin I (High Sensitivity) 11/06/2019 3  <18 ng/L Final   Comment: (NOTE) Elevated high sensitivity troponin I (hsTnI) values and significant  changes across serial measurements may suggest ACS but many other  chronic and acute conditions are known to elevate hsTnI results.  Refer to the "Links" section for chest pain algorithms and additional  guidance. Performed at [facility], [address] [place], [place] [address]     ECG: Date: 11/06/2019 Time ECG obtained: 1924 PM Rate: 77 bpm Rhythm: normal sinus Axis (leads I and aVF): Normal Intervals: PR 132 ms. QRS 72 ms. QTc 448 ms. ST segment and T wave changes: No evidence of acute ST segment elevation or depression Comparison: Similar to previous tracing obtained on 10/06/2019   IMAGING / PROCEDURES: DIAGNOSTIC RADIOGRAPHS OF CHEST done on 12/08/2019  1. Stable mild interstitial prominence throughout the lungs 2. No acute findings  ULTRASOUND ABDOMEN LIMITED RIGHT UPPER QUADRANT done on 11/07/2019 1. Cholelithiasis without sonographic evidence of acute cholecystitis 2. Diffusely increased liver  echogenicity most commonly seen in the setting of  fatty infiltration.  Superimposed inflammation or fibrosis is not excluded. 3. Portal vein is patent on color Doppler imaging with normal direction of blood flow towards the liver  ECHOCARDIOGRAM done on 03/04/2015 1. LVEF 60-65% 2. Left-ventricular cavity size and wall thickness normal.  There were no RWMAs.  Left-ventricular diastolic function parameters were normal. 3. Right ventricular cavity size, wall thickness, and systolic function normal.  4. Trivial mitral valve insufficiency 5. No valvular stenosis . 6. Aortic root was normal in size 7. There is no pericardial effusion  LEXISCAN done on 06/07/2018 1. LVEF 55-65% 2. Small in size, mild in severity, fixed apical anterior and apical defect most likely representing artifact but cannot rule out small area of non-transmural scar 3. There is no evidence of significant ischemia.   4. Left ventricular wall motion normal 5. Sensitivity and specificity of the study are degraded by significant extracardiac activity, motion artifact, and breast attenuation      Impression and Plan:  Kemper DurieCindy L Stewart has been referred for pre-anesthesia review and clearance prior to her undergoing the planned anesthetic and procedural courses. Available labs, pertinent testing, and imaging results were personally reviewed by me. This patient has been appropriately cleared by internal medicine Althea Charon(Karamalegos, MD) and cardiology Kirke Corin(Arida, MD). Patient is still pending pulmonology clearance.  It is unclear patient can be seen prior to her presently scheduled surgery date of 12/18/2019.  In speaking with the pulmonology office, it appears as if there is no availability this week due to physician coverage and it being a holiday week as schedule is already booked.  [facility]ology practice [facility], however due to transportation issues, it is unclear if she can make it to that location.  Currently awaiting disposition at this  time.  Based on clinical review performed today (12/11/19), barring any significant acute changes in the patient's overall condition, it is anticipated that she will be able to proceed with the planned surgical intervention once pulmonology clearance has been obtained. Any acute changes in clinical condition may necessitate her procedure being postponed and/or cancelled. Pre-surgical instructions were reviewed with the patient during her PAT appointment and questions were fielded by PAT clinical staff.  Quentin MullingBryan Oluwatobiloba Martin, MSN, APRN, FNP-C, CEN [facility] Nurse Practitioner Phone: [phone] 12/11/19 1:05 PM  NOTE: This note has been prepared using Dragon dictation software. Despite my best ability to proofread, there is always the potential that unintentional transcriptional errors may still occur from this process.

## 2019-12-11 NOTE — Telephone Encounter (Signed)
Spoke with patient at this time. She didn't go to Pulmonology. She did go to PCP. Spoke with Wyatt Mage and she will send a message to the nurse asking if patient can be seen before January due to surgery scheduled on 12/18/19.

## 2019-12-11 NOTE — Telephone Encounter (Signed)
Lm for Tanya Hardin.

## 2019-12-12 ENCOUNTER — Telehealth: Payer: Self-pay

## 2019-12-12 NOTE — Progress Notes (Signed)
Medical clearance has been received from Dr Althea Charon. The patient is cleared at Low risk for surgery. He reports that she had a chest x-ray and was started on inhalers. Her goal is to quit smoking prior to surgery.

## 2019-12-12 NOTE — Progress Notes (Signed)
  [facility] Perioperative Services: Pre-Admission/Anesthesia Testing     Date: 12/12/19  Name: Tanya Hardin MRN:   [id number]  Re: Follow up on PCCM clearance for surgery on 12/18/2019   Case: [id number] Date/Time: 12/18/19 0715   Procedure: XI ROBOTIC ASSISTED LAPAROSCOPIC CHOLECYSTECTOMY (N/A )   Anesthesia type: General   Pre-op diagnosis: Sx cholelithiasis   Location: [facility] OR ROOM 04 / [facility] ORS FOR ANESTHESIA GROUP   Surgeons: Duanne Guess, MD    Patient scheduled for the above procedure on 12/18/2019 with Dr. Duanne Guess.  Patient has received clearances from her cardiologist and PCP.  She had requested to see pulmonology prior to surgery, missed the appointment that was scheduled for her on December 06, 2019; see notes.  Patient cannot be seen by pulmonology until January at this point.  Options have been discussed with patient and attending surgeon.  Per Dr. Lady Gary, pulmonology consult is not necessary for patient to proceed with surgery at this time.  This has been discussed with patient.  Patient is in agreement to proceed with surgery on 12/18/2019 as previously scheduled.  Preanesthesia review has been conducted; see note from 12/11/2019.  Patient appropriately cleared by family medicine and cardiology.  Barring any acute changes to patient's condition, patient should be able to proceed with planned surgical course.  Quentin Mulling, MSN, APRN, FNP-C, CEN [facility]  Peri-operative Services Nurse Practitioner Phone: [phone] 12/12/19 3:14 PM  NOTE: This note has been prepared using Dragon dictation software. Despite my best ability to proofread, there is always the potential that unintentional transcriptional errors may still occur from this process.

## 2019-12-12 NOTE — Telephone Encounter (Signed)
Medical Clearance re-faxed to Dr.Karamalegos office at this time. Second request. Surgery scheduled 12/18/19.

## 2019-12-13 ENCOUNTER — Inpatient Hospital Stay: Payer: Self-pay | Attending: Oncology | Admitting: Oncology

## 2019-12-13 ENCOUNTER — Ambulatory Visit: Payer: Self-pay | Admitting: Family Medicine

## 2019-12-15 ENCOUNTER — Other Ambulatory Visit: Payer: Self-pay

## 2019-12-15 ENCOUNTER — Other Ambulatory Visit
Admission: RE | Admit: 2019-12-15 | Discharge: 2019-12-15 | Disposition: A | Discharge status: Home / Self Care | Payer: HRSA Program | Source: Ambulatory Visit | Attending: General Surgery | Admitting: General Surgery

## 2019-12-15 DIAGNOSIS — Z01812 Encounter for preprocedural laboratory examination: Secondary | ICD-10-CM | POA: Insufficient documentation

## 2019-12-15 DIAGNOSIS — Z20822 Contact with and (suspected) exposure to covid-19: Secondary | ICD-10-CM | POA: Diagnosis not present

## 2019-12-16 LAB — SARS CORONAVIRUS 2 (TAT 6-24 HRS): SARS Coronavirus 2: NEGATIVE

## 2019-12-17 MED ORDER — INDOCYANINE GREEN 25 MG IV SOLR
7.5000 mg | Freq: Once | INTRAVENOUS | Status: AC
  Administered 2019-12-18: 7.5 mg via INTRAVENOUS
  Filled 2019-12-17: qty 10
  Filled 2019-12-17: qty 3

## 2019-12-18 ENCOUNTER — Encounter: Payer: Self-pay | Admitting: General Surgery

## 2019-12-18 ENCOUNTER — Ambulatory Visit: Payer: Self-pay | Admitting: Urgent Care

## 2019-12-18 ENCOUNTER — Encounter
Admission: RE | Disposition: A | Discharge status: Home / Self Care | Payer: Self-pay | Source: Home / Self Care | Attending: General Surgery

## 2019-12-18 ENCOUNTER — Other Ambulatory Visit: Payer: Self-pay

## 2019-12-18 ENCOUNTER — Ambulatory Visit
Admission: RE | Admit: 2019-12-18 | Discharge: 2019-12-18 | Disposition: A | Discharge status: Home / Self Care | Payer: Self-pay | Attending: General Surgery | Admitting: General Surgery

## 2019-12-18 DIAGNOSIS — Z885 Allergy status to narcotic agent status: Secondary | ICD-10-CM | POA: Insufficient documentation

## 2019-12-18 DIAGNOSIS — Z79899 Other long term (current) drug therapy: Secondary | ICD-10-CM | POA: Insufficient documentation

## 2019-12-18 DIAGNOSIS — K8020 Calculus of gallbladder without cholecystitis without obstruction: Secondary | ICD-10-CM

## 2019-12-18 DIAGNOSIS — Z7982 Long term (current) use of aspirin: Secondary | ICD-10-CM | POA: Insufficient documentation

## 2019-12-18 DIAGNOSIS — K8010 Calculus of gallbladder with chronic cholecystitis without obstruction: Secondary | ICD-10-CM | POA: Insufficient documentation

## 2019-12-18 DIAGNOSIS — F17210 Nicotine dependence, cigarettes, uncomplicated: Secondary | ICD-10-CM | POA: Insufficient documentation

## 2019-12-18 SURGERY — CHOLECYSTECTOMY, ROBOT-ASSISTED, LAPAROSCOPIC
Anesthesia: General | Site: Abdomen

## 2019-12-18 MED ORDER — MIDAZOLAM HCL 2 MG/2ML IJ SOLN
INTRAMUSCULAR | Status: DC | PRN
  Administered 2019-12-18: 2 mg via INTRAVENOUS

## 2019-12-18 MED ORDER — LIDOCAINE-EPINEPHRINE 1 %-1:100000 IJ SOLN
INTRAMUSCULAR | Status: DC | PRN
  Administered 2019-12-18: 6 mL

## 2019-12-18 MED ORDER — CHLORHEXIDINE GLUCONATE CLOTH 2 % EX PADS: 6.0000 | MEDICATED_PAD | Freq: Once | CUTANEOUS | Status: DC

## 2019-12-18 MED ORDER — FAMOTIDINE 20 MG PO TABS
ORAL_TABLET | ORAL | Status: AC
  Administered 2019-12-18: 20 mg via ORAL
  Filled 2019-12-18: qty 1

## 2019-12-18 MED ORDER — FENTANYL CITRATE (PF) 100 MCG/2ML IJ SOLN
INTRAMUSCULAR | Status: AC
  Filled 2019-12-18: qty 2

## 2019-12-18 MED ORDER — INDOCYANINE GREEN 25 MG IV SOLR: 7.5000 mg | INTRAVENOUS | Status: DC

## 2019-12-18 MED ORDER — EPHEDRINE SULFATE 50 MG/ML IJ SOLN
INTRAMUSCULAR | Status: DC | PRN
  Administered 2019-12-18: 10 mg via INTRAVENOUS

## 2019-12-18 MED ORDER — LIDOCAINE-EPINEPHRINE 1 %-1:100000 IJ SOLN
INTRAMUSCULAR | Status: AC
  Filled 2019-12-18: qty 1

## 2019-12-18 MED ORDER — HYDROCODONE-ACETAMINOPHEN 5-325 MG PO TABS: 1.0000 | ORAL_TABLET | Freq: Four times a day (QID) | ORAL | 0 refills | Status: DC | PRN

## 2019-12-18 MED ORDER — ONDANSETRON HCL 4 MG/2ML IJ SOLN
INTRAMUSCULAR | Status: AC
  Filled 2019-12-18: qty 2

## 2019-12-18 MED ORDER — CELECOXIB 200 MG PO CAPS
ORAL_CAPSULE | ORAL | Status: AC
  Filled 2019-12-18: qty 1

## 2019-12-18 MED ORDER — SUGAMMADEX SODIUM 500 MG/5ML IV SOLN
INTRAVENOUS | Status: DC | PRN
  Administered 2019-12-18: 150 mg via INTRAVENOUS

## 2019-12-18 MED ORDER — FENTANYL CITRATE (PF) 100 MCG/2ML IJ SOLN
25.0000 ug | INTRAMUSCULAR | Status: DC | PRN
  Administered 2019-12-18 (×3): 25 ug via INTRAVENOUS

## 2019-12-18 MED ORDER — CHLORHEXIDINE GLUCONATE 0.12 % MT SOLN
OROMUCOSAL | Status: AC
  Administered 2019-12-18: 15 mL via OROMUCOSAL
  Filled 2019-12-18: qty 15

## 2019-12-18 MED ORDER — ACETAMINOPHEN 500 MG PO TABS
ORAL_TABLET | ORAL | Status: AC
  Administered 2019-12-18: 1000 mg via ORAL
  Filled 2019-12-18: qty 2

## 2019-12-18 MED ORDER — FENTANYL CITRATE (PF) 100 MCG/2ML IJ SOLN
INTRAMUSCULAR | Status: DC | PRN
  Administered 2019-12-18 (×2): 25 ug via INTRAVENOUS
  Administered 2019-12-18: 50 ug via INTRAVENOUS

## 2019-12-18 MED ORDER — GLYCOPYRROLATE 0.2 MG/ML IJ SOLN
INTRAMUSCULAR | Status: DC | PRN
  Administered 2019-12-18: .2 mg via INTRAVENOUS

## 2019-12-18 MED ORDER — DEXAMETHASONE SODIUM PHOSPHATE 10 MG/ML IJ SOLN
INTRAMUSCULAR | Status: DC | PRN
  Administered 2019-12-18: 10 mg via INTRAVENOUS

## 2019-12-18 MED ORDER — LACTATED RINGERS IV SOLN: INTRAVENOUS | Status: DC

## 2019-12-18 MED ORDER — DEXMEDETOMIDINE (PRECEDEX) IN NS 20 MCG/5ML (4 MCG/ML) IV SYRINGE
PREFILLED_SYRINGE | INTRAVENOUS | Status: DC | PRN
  Administered 2019-12-18: 4 ug via INTRAVENOUS

## 2019-12-18 MED ORDER — LIDOCAINE HCL (PF) 2 % IJ SOLN
INTRAMUSCULAR | Status: AC
  Filled 2019-12-18: qty 5

## 2019-12-18 MED ORDER — PROPOFOL 10 MG/ML IV BOLUS
INTRAVENOUS | Status: DC | PRN
  Administered 2019-12-18: 140 mg via INTRAVENOUS

## 2019-12-18 MED ORDER — IBUPROFEN 800 MG PO TABS: 800.0000 mg | ORAL_TABLET | Freq: Three times a day (TID) | ORAL | 0 refills | Status: DC | PRN

## 2019-12-18 MED ORDER — GABAPENTIN 300 MG PO CAPS
ORAL_CAPSULE | ORAL | Status: AC
  Administered 2019-12-18: 300 mg via ORAL
  Filled 2019-12-18: qty 1

## 2019-12-18 MED ORDER — SEVOFLURANE IN SOLN
RESPIRATORY_TRACT | Status: AC
  Filled 2019-12-18: qty 250

## 2019-12-18 MED ORDER — CELECOXIB 200 MG PO CAPS
200.0000 mg | ORAL_CAPSULE | ORAL | Status: AC
  Administered 2019-12-18: 200 mg via ORAL

## 2019-12-18 MED ORDER — OXYCODONE HCL 5 MG PO TABS
ORAL_TABLET | ORAL | Status: AC
  Filled 2019-12-18: qty 1

## 2019-12-18 MED ORDER — FENTANYL CITRATE (PF) 100 MCG/2ML IJ SOLN
INTRAMUSCULAR | Status: AC
  Administered 2019-12-18: 25 ug via INTRAVENOUS
  Filled 2019-12-18: qty 2

## 2019-12-18 MED ORDER — ROCURONIUM BROMIDE 10 MG/ML (PF) SYRINGE
PREFILLED_SYRINGE | INTRAVENOUS | Status: AC
  Filled 2019-12-18: qty 10

## 2019-12-18 MED ORDER — OXYCODONE HCL 5 MG PO TABS
5.0000 mg | ORAL_TABLET | Freq: Once | ORAL | Status: AC | PRN
  Administered 2019-12-18: 5 mg via ORAL

## 2019-12-18 MED ORDER — ROCURONIUM BROMIDE 100 MG/10ML IV SOLN
INTRAVENOUS | Status: DC | PRN
  Administered 2019-12-18: 50 mg via INTRAVENOUS
  Administered 2019-12-18: 30 mg via INTRAVENOUS

## 2019-12-18 MED ORDER — KETOROLAC TROMETHAMINE 30 MG/ML IJ SOLN
INTRAMUSCULAR | Status: AC
  Administered 2019-12-18: 30 mg via INTRAVENOUS
  Filled 2019-12-18: qty 1

## 2019-12-18 MED ORDER — LIDOCAINE HCL (CARDIAC) PF 100 MG/5ML IV SOSY
PREFILLED_SYRINGE | INTRAVENOUS | Status: DC | PRN
  Administered 2019-12-18: 60 mg via INTRAVENOUS

## 2019-12-18 MED ORDER — PROMETHAZINE HCL 25 MG/ML IJ SOLN: 6.2500 mg | INTRAMUSCULAR | Status: DC | PRN

## 2019-12-18 MED ORDER — CHLORHEXIDINE GLUCONATE 0.12 % MT SOLN: 15.0000 mL | Freq: Once | OROMUCOSAL | Status: AC

## 2019-12-18 MED ORDER — ACETAMINOPHEN 500 MG PO TABS: 1000.0000 mg | ORAL_TABLET | ORAL | Status: AC

## 2019-12-18 MED ORDER — MIDAZOLAM HCL 2 MG/2ML IJ SOLN
INTRAMUSCULAR | Status: AC
  Filled 2019-12-18: qty 2

## 2019-12-18 MED ORDER — PROPOFOL 10 MG/ML IV BOLUS
INTRAVENOUS | Status: AC
  Filled 2019-12-18: qty 40

## 2019-12-18 MED ORDER — FENTANYL CITRATE (PF) 100 MCG/2ML IJ SOLN
INTRAMUSCULAR | Status: AC
  Administered 2019-12-18: 50 ug via INTRAVENOUS
  Filled 2019-12-18: qty 2

## 2019-12-18 MED ORDER — FAMOTIDINE 20 MG PO TABS: 20.0000 mg | ORAL_TABLET | Freq: Once | ORAL | Status: AC

## 2019-12-18 MED ORDER — ORAL CARE MOUTH RINSE: 15.0000 mL | Freq: Once | OROMUCOSAL | Status: AC

## 2019-12-18 MED ORDER — DEXAMETHASONE SODIUM PHOSPHATE 10 MG/ML IJ SOLN
INTRAMUSCULAR | Status: AC
  Filled 2019-12-18: qty 1

## 2019-12-18 MED ORDER — BUPIVACAINE HCL (PF) 0.25 % IJ SOLN
INTRAMUSCULAR | Status: AC
  Filled 2019-12-18: qty 30

## 2019-12-18 MED ORDER — OXYCODONE HCL 5 MG/5ML PO SOLN: 5.0000 mg | Freq: Once | ORAL | Status: AC | PRN

## 2019-12-18 MED ORDER — KETOROLAC TROMETHAMINE 30 MG/ML IJ SOLN: 30.0000 mg | Freq: Once | INTRAMUSCULAR | Status: AC

## 2019-12-18 MED ORDER — GABAPENTIN 300 MG PO CAPS: 300.0000 mg | ORAL_CAPSULE | ORAL | Status: AC

## 2019-12-18 MED ORDER — MEPERIDINE HCL 50 MG/ML IJ SOLN: 6.2500 mg | INTRAMUSCULAR | Status: DC | PRN

## 2019-12-18 MED ORDER — CEFAZOLIN SODIUM-DEXTROSE 2-4 GM/100ML-% IV SOLN
INTRAVENOUS | Status: AC
  Filled 2019-12-18: qty 100

## 2019-12-18 MED ORDER — ONDANSETRON HCL 4 MG/2ML IJ SOLN
INTRAMUSCULAR | Status: DC | PRN
  Administered 2019-12-18: 4 mg via INTRAVENOUS

## 2019-12-18 MED ORDER — BUPIVACAINE HCL (PF) 0.25 % IJ SOLN
INTRAMUSCULAR | Status: DC | PRN
  Administered 2019-12-18: 6 mL

## 2019-12-18 MED ORDER — CEFAZOLIN SODIUM-DEXTROSE 2-4 GM/100ML-% IV SOLN
2.0000 g | INTRAVENOUS | Status: AC
  Administered 2019-12-18: 2 g via INTRAVENOUS

## 2019-12-18 SURGICAL SUPPLY — 57 items
ADH SKN CLS APL DERMABOND .7 (GAUZE/BANDAGES/DRESSINGS) ×1
APL PRP STRL LF DISP 70% ISPRP (MISCELLANEOUS) ×1
BAG SPEC RTRVL LRG 6X4 10 (ENDOMECHANICALS) ×1
CANISTER SUCT 1200ML W/VALVE (MISCELLANEOUS) IMPLANT
CANNULA REDUC XI 12-8 STAPL (CANNULA) ×1
CANNULA REDUCER 12-8 DVNC XI (CANNULA) ×1 IMPLANT
CHLORAPREP W/TINT 26 (MISCELLANEOUS) ×2 IMPLANT
CLIP VESOLOCK MED LG 6/CT (CLIP) ×2 IMPLANT
COVER WAND RF STERILE (DRAPES) ×2 IMPLANT
DECANTER SPIKE VIAL GLASS SM (MISCELLANEOUS) IMPLANT
DEFOGGER SCOPE WARMER CLEARIFY (MISCELLANEOUS) ×2 IMPLANT
DERMABOND ADVANCED (GAUZE/BANDAGES/DRESSINGS) ×1
DERMABOND ADVANCED .7 DNX12 (GAUZE/BANDAGES/DRESSINGS) ×1 IMPLANT
DRAPE ARM DVNC X/XI (DISPOSABLE) ×4 IMPLANT
DRAPE COLUMN DVNC XI (DISPOSABLE) ×1 IMPLANT
DRAPE DA VINCI XI ARM (DISPOSABLE) ×4
DRAPE DA VINCI XI COLUMN (DISPOSABLE) ×1
ELECT CAUTERY BLADE TIP 2.5 (TIP) ×2
ELECT REM PT RETURN 9FT ADLT (ELECTROSURGICAL) ×2
ELECTRODE CAUTERY BLDE TIP 2.5 (TIP) ×1 IMPLANT
ELECTRODE REM PT RTRN 9FT ADLT (ELECTROSURGICAL) ×1 IMPLANT
GLOVE BIO SURGEON STRL SZ 6.5 (GLOVE) ×4 IMPLANT
GLOVE INDICATOR 7.0 STRL GRN (GLOVE) ×4 IMPLANT
GOWN STRL REUS W/ TWL LRG LVL3 (GOWN DISPOSABLE) ×3 IMPLANT
GOWN STRL REUS W/TWL LRG LVL3 (GOWN DISPOSABLE) ×6
GRASPER SUT TROCAR 14GX15 (MISCELLANEOUS) IMPLANT
IRRIGATOR SUCT 8 DISP DVNC XI (IRRIGATION / IRRIGATOR) ×1 IMPLANT
IRRIGATOR SUCTION 8MM XI DISP (IRRIGATION / IRRIGATOR) ×1
IV NS 1000ML (IV SOLUTION) ×2
IV NS 1000ML BAXH (IV SOLUTION) ×1 IMPLANT
KIT PINK PAD W/HEAD ARE REST (MISCELLANEOUS) ×2
KIT PINK PAD W/HEAD ARM REST (MISCELLANEOUS) ×1 IMPLANT
LABEL OR SOLS (LABEL) ×2 IMPLANT
MANIFOLD NEPTUNE II (INSTRUMENTS) ×2 IMPLANT
NEEDLE HYPO 22GX1.5 SAFETY (NEEDLE) ×2 IMPLANT
NEEDLE INSUFFLATION 14GA 120MM (NEEDLE) ×2 IMPLANT
NS IRRIG 500ML POUR BTL (IV SOLUTION) ×2 IMPLANT
OBTURATOR OPTICAL STANDARD 8MM (TROCAR) ×1
OBTURATOR OPTICAL STND 8 DVNC (TROCAR) ×1
OBTURATOR OPTICALSTD 8 DVNC (TROCAR) ×1 IMPLANT
PACK LAP CHOLECYSTECTOMY (MISCELLANEOUS) ×2 IMPLANT
PENCIL ELECTRO HAND CTR (MISCELLANEOUS) ×2 IMPLANT
POUCH SPECIMEN RETRIEVAL 10MM (ENDOMECHANICALS) ×2 IMPLANT
SEAL CANN UNIV 5-8 DVNC XI (MISCELLANEOUS) ×3 IMPLANT
SEAL XI 5MM-8MM UNIVERSAL (MISCELLANEOUS) ×3
SET TUBE SMOKE EVAC HIGH FLOW (TUBING) ×2 IMPLANT
SOLUTION ELECTROLUBE (MISCELLANEOUS) ×2 IMPLANT
STAPLER CANNULA SEAL DVNC XI (STAPLE) ×1 IMPLANT
STAPLER CANNULA SEAL XI (STAPLE) ×1
STRIP CLOSURE SKIN 1/2X4 (GAUZE/BANDAGES/DRESSINGS) ×2 IMPLANT
SUT MNCRL 4-0 (SUTURE) ×2
SUT MNCRL 4-0 27XMFL (SUTURE) ×1
SUT VIC AB 3-0 SH 27 (SUTURE) ×2
SUT VIC AB 3-0 SH 27X BRD (SUTURE) ×1 IMPLANT
SUT VICRYL 0 AB UR-6 (SUTURE) ×4 IMPLANT
SUTURE MNCRL 4-0 27XMF (SUTURE) ×1 IMPLANT
TROCAR XCEL NON-BLD 5MMX100MML (ENDOMECHANICALS) ×2 IMPLANT

## 2019-12-18 NOTE — Discharge Instructions (Signed)

## 2019-12-18 NOTE — H&P (Signed)
. New Patient (Initial Visit)    gallstones    HPI Tanya NAZARYAN is a 54 y.o. female.   She is here today to follow-up from an emergency department visit on November 06, 2019.  She reports that off and on, throughout the past summer, she has had episodes of indigestion and epigastric/right upper quadrant pain.  On the day of her presentation to the emergency room, however, the pain was severe and accompanied by diarrhea and vomiting.  She reports that she ate some chili that day.  Evaluation in the emergency department showed gallstones and she was diagnosed with symptomatic cholelithiasis.  Today, she reports that she has modified her diet to try and avoid having a recurrence of her symptoms.  She says that she is eating less than usual and drinking only water.  She has been eating bland foods and trying to avoid anything fried or fatty.  She has not experienced any fevers or chills and no recurrence of nausea or vomiting.  She denies any history of pancreatitis.  She has never had jaundice, acholic stools, or dark, tea-colored urine.  She has never had any upper abdominal surgery.       Past Medical History:  Diagnosis Date  . Chest pain    a. 05/2018 MV: EF 55-65%, small, mild, fixed apical ant and apical defect, most likely artifact. No ischemia-->Low risk.  Marland Kitchen COPD (chronic obstructive pulmonary disease) (HCC)   . Essential hypertension   . History of echocardiogram    a. 02/2015 Echo: Ef 60-65%. no rwma.  Marland Kitchen PSVT (paroxysmal supraventricular tachycardia) (HCC)   . Tobacco abuse          Past Surgical History:  Procedure Laterality Date  . CESAREAN SECTION      Family History  Family history unknown: Yes    Social History Social History        Tobacco Use  . Smoking status: Current Every Day Smoker    Packs/day: 1.50    Years: 30.00    Pack years: 45.00    Types: Cigarettes  . Smokeless tobacco: Never Used  Vaping Use  . Vaping Use: Never  used  Substance Use Topics  . Alcohol use: Not Currently  . Drug use: Not Currently         Allergies  Allergen Reactions  . Codeine Itching, Nausea And Vomiting and Other (See Comments)    Sweaty, severe vomiting, room spinning.           Current Outpatient Medications  Medication Sig Dispense Refill  . FLUoxetine (PROZAC) 10 MG tablet Take 10 mg by mouth daily.     . metoprolol tartrate (LOPRESSOR) 25 MG tablet TAKE 1 TABLET BY MOUTH TWICE A DAY 180 tablet 3  . albuterol (VENTOLIN HFA) 108 (90 Base) MCG/ACT inhaler Inhale 2 puffs into the lungs as directed. (Patient not taking: Reported on 11/09/2019) 1 each 3  . aspirin EC 81 MG tablet Take 81 mg by mouth daily. Swallow whole. (Patient not taking: Reported on 11/09/2019)    . omeprazole (PRILOSEC) 20 MG capsule Take 20 mg by mouth daily. (Patient not taking: Reported on 11/09/2019)    . ondansetron (ZOFRAN ODT) 4 MG disintegrating tablet Take 1 tablet (4 mg total) by mouth every 8 (eight) hours as needed. (Patient not taking: Reported on 11/09/2019) 20 tablet 0  . oxyCODONE-acetaminophen (PERCOCET) 5-325 MG tablet Take 1 tablet by mouth every 4 (four) hours as needed. (Patient not taking: Reported on 11/09/2019) 20  tablet 0   No current facility-administered medications for this visit.    Review of Systems Review of Systems  Eyes: Positive for visual disturbance.       Blurry vision  Respiratory: Positive for shortness of breath.   Gastrointestinal: Positive for abdominal pain, diarrhea, nausea and vomiting.       Heartburn  All other systems were reviewed and are negative or as discussed in the history of present illness.  Today's Vitals   12/18/19 0648  BP: (!) 101/57  Pulse: 61  Resp: 16  Temp: (!) 96.1 F (35.6 C)  TempSrc: Tympanic  SpO2: 96%  Weight: 83 kg  Height: 5\' 2"  (1.575 m)  PainSc: 0-No pain   Body mass index is 33.47 kg/m.   Physical Exam Physical Exam Constitutional:       General: She is not in acute distress. She is extremely anxious.    Appearance: She is obese.  HENT:     Head: Normocephalic and atraumatic.     Nose:     Comments: Covered with a mask    Mouth/Throat:     Comments: Covered with a mask Eyes:     General: No scleral icterus.       Right eye: No discharge.        Left eye: No discharge.  Neck:     Comments: No palpable cervical or supraclavicular lymphadenopathy.  The trachea is midline.  No dominant thyroid masses or thyromegaly appreciated.  The gland moves freely with deglutition. Cardiovascular:     Rate and Rhythm: Normal rate and regular rhythm.     Pulses: Normal pulses.     Heart sounds: Normal heart sounds.  Pulmonary:     Effort: Pulmonary effort is normal.     Breath sounds: Normal breath sounds.  Abdominal:     General: Bowel sounds are normal.     Palpations: Abdomen is soft.     Comments: She denies any pain on palpation.  Murphy sign is negative.  She is obese.  Genitourinary:    Comments: Deferred Musculoskeletal:        General: No deformity or signs of injury.  Skin:    General: Skin is warm and dry.  Neurological:     General: No focal deficit present.     Mental Status: She is alert and oriented to person, place, and time.  Psychiatric:        Mood and Affect: Mood normal.        Behavior: Behavior normal.     Data Reviewed Labs from her recent emergency room visit were reviewed and copied here.  No significant abnormalities identified.  Results for Kemper DurieDANIELS, Nakenya L (MRN [id number]) as of 11/09/2019 17:21  Ref. Range 11/06/2019 19:22  Sodium Latest Ref Range: 135 - 145 mmol/L 139  Potassium Latest Ref Range: 3.5 - 5.1 mmol/L 3.8  Chloride Latest Ref Range: 98 - 111 mmol/L 104  CO2 Latest Ref Range: 22 - 32 mmol/L 24  Glucose Latest Ref Range: 70 - 99 mg/dL 409123 (H)  BUN Latest Ref Range: 6 - 20 mg/dL 15  Creatinine Latest Ref Range: 0.44 - 1.00 mg/dL 8.110.74  Calcium Latest Ref Range: 8.9 - 10.3  mg/dL 8.7 (L)  Anion gap Latest Ref Range: 5 - 15  11  Alkaline Phosphatase Latest Ref Range: 38 - 126 U/L 67  Albumin Latest Ref Range: 3.5 - 5.0 g/dL 3.7  Lipase Latest Ref Range: 11 - 51 U/L 32  AST Latest  Ref Range: 15 - 41 U/L 63 (H)  ALT Latest Ref Range: 0 - 44 U/L 36  Total Protein Latest Ref Range: 6.5 - 8.1 g/dL 7.9  Total Bilirubin Latest Ref Range: 0.3 - 1.2 mg/dL 0.8  GFR, Estimated Latest Ref Range: >60 mL/min >60  Troponin I (High Sensitivity) Latest Ref Range: <18 ng/L <2  WBC Latest Ref Range: 4.0 - 10.5 K/uL 9.7  RBC Latest Ref Range: 3.87 - 5.11 MIL/uL 4.64  Hemoglobin Latest Ref Range: 12.0 - 15.0 g/dL 13.0  HCT Latest Ref Range: 36 - 46 % 40.7  MCV Latest Ref Range: 80.0 - 100.0 fL 87.7  MCH Latest Ref Range: 26.0 - 34.0 pg 30.6  MCHC Latest Ref Range: 30.0 - 36.0 g/dL 86.5  RDW Latest Ref Range: 11.5 - 15.5 % 11.9  Platelets Latest Ref Range: 150 - 400 K/uL 220  nRBC Latest Ref Range: 0.0 - 0.2 % 0.0  Appearance Latest Ref Range: CLEAR  CLOUDY (A)  Bilirubin Urine Latest Ref Range: NEGATIVE  NEGATIVE  Color, Urine Latest Ref Range: YELLOW  AMBER (A)  Glucose, UA Latest Ref Range: NEGATIVE mg/dL NEGATIVE  Hgb urine dipstick Latest Ref Range: NEGATIVE  NEGATIVE  Ketones, ur Latest Ref Range: NEGATIVE mg/dL NEGATIVE  Leukocytes,Ua Latest Ref Range: NEGATIVE  NEGATIVE  Nitrite Latest Ref Range: NEGATIVE  NEGATIVE  pH Latest Ref Range: 5.0 - 8.0  5.0  Protein Latest Ref Range: NEGATIVE mg/dL 30 (A)  Specific Gravity, Urine Latest Ref Range: 1.005 - 1.030  1.026  Bacteria, UA Latest Ref Range: NONE SEEN  NONE SEEN  Ca Oxalate Crys, UA Unknown PRESENT  Mucus Unknown PRESENT  RBC / HPF Latest Ref Range: 0 - 5 RBC/hpf 0-5  Squamous Epithelial / LPF Latest Ref Range: 0 - 5  11-20  WBC, UA Latest Ref Range: 0 - 5 WBC/hpf 6-10   I also reviewed the ultrasound performed that evening and concur with the radiologist impression copied here: CLINICAL DATA: 54 year old  female with upper abdominal pain.  EXAM: ULTRASOUND ABDOMEN LIMITED RIGHT UPPER QUADRANT  COMPARISON: None.  FINDINGS: Gallbladder:  There is a 15 mm gallstone. Mild thickened appearance of the gallbladder wall measuring 4 mm, likely partly related to partial contraction. No pericholecystic fluid. Negative sonographic Murphy's sign.  Common bile duct:  Diameter: 3 mm  Liver:  There is diffuse increased liver echogenicity most commonly seen in the setting of fatty infiltration. Superimposed inflammation or fibrosis is not excluded. Clinical correlation is recommended. Portal vein is patent on color Doppler imaging with normal direction of blood flow towards the liver.  Other: None.  IMPRESSION: 1. Cholelithiasis without sonographic evidence of acute cholecystitis. 2. Fatty liver.  I reviewed the recent cardiology note from September 21, 2019.  This does discuss prior cardiac evaluation, including a history of paroxysmal SVT with a nonischemic stress test in May 2020.  She also had an echocardiogram in February 2017 that showed a normal ejection fraction.  Via the electronic medical record, I was able to pulmonary function tests.  The results are copied here: Miscellaneous Results - documented in this encounter  Spirometry (09/01/2018 12:10 PM EDT)       Spirometry (09/01/2018 12:10 PM EDT)  Component Value Ref Range Performed At Pathologist Signature  FVC PRE 2.58 2.42 - 3.85 L [facility]   FEV1 PRE 1.81 (L) 1.93 - 3.08 L [facility]   FEV1/FVC PRE 70.14 68.81 - 91.74 % [facility]   FEV6 PRE 2.49 (L)  2.50 - 3.78 L [facility]   FEV1/FEV6 PRE 72.62 (L) 72.87 - 90.47 % [facility]   FEF25-75% PRE 1.09 (L) 1.32 - 3.56 L/s [facility]   ISOFEF25-75 PRE 1.09 L/s [facility]   FEF50% PRE 1.84 (L) 1.87 - 5.49 L/s [facility]   PEF PRE 5.64 4.69 - 7.93 L/s [facility]   FET100% Change 7.84 sec [facility]   FIVC PRE 1.44 (L) 2.42 - 3.85 L [facility]   FIF50% PRED 0.90 L/s [facility]    QJJ/HER74 pre 203.55 % [facility]   PIF PRE 1.52 (L) 2.58 - 7.38 L/s [facility]   Vol extrap pre 0.03 L [facility]    She was diagnosed with COPD.  Assessment This is a 54 year old woman with symptomatic cholelithiasis.  I have recommended that she undergo robot-assisted laparoscopic cholecystectomy.  Plan I discussed the procedure in detail.  We discussed the risks and benefits of a laparoscopic cholecystectomy and possible cholangiogram including, but not limited to: bleeding, infection, injury to surrounding structures such as the intestine or liver, bile leak, retained gallstones, need to convert to an open procedure, prolonged diarrhea, blood clots such as DVT, common bile duct injury, anesthesia risks, and possible need for additional procedures. The patient had the opportunity to ask any questions and these were answered to her satisfaction.  We will proceed with cholecystectomy today.

## 2019-12-18 NOTE — Anesthesia Procedure Notes (Signed)
Procedure Name: Intubation Date/Time: 12/18/2019 7:42 AM Performed by: Manning Charity, CRNA Pre-anesthesia Checklist: Patient identified, Emergency Drugs available, Suction available and Patient being monitored Patient Re-evaluated:Patient Re-evaluated prior to induction Oxygen Delivery Method: Circle system utilized Preoxygenation: Pre-oxygenation with 100% oxygen Induction Type: IV induction Ventilation: Mask ventilation without difficulty Laryngoscope Size: McGraph and 3 Grade View: Grade I Tube type: Oral Tube size: 7.0 mm Number of attempts: 1 Airway Equipment and Method: Stylet Placement Confirmation: ETT inserted through vocal cords under direct vision,  positive ETCO2 and breath sounds checked- equal and bilateral Secured at: 21 cm Tube secured with: Tape Dental Injury: Teeth and Oropharynx as per pre-operative assessment

## 2019-12-18 NOTE — Op Note (Signed)
Operative note  Pre-operative Diagnosis: Symptomatic cholelithiasis  Post-operative Diagnosis: Same  Procedure: Robot assisted laparoscopic cholecystectomy  Surgeon: Duanne Guess, MD  Anesthesia: GETA  Findings: Extensive intra-abdominal fat, multiple stones within the gallbladder, no acute inflammation to suggest cholecystitis  Estimated Blood Loss: 5 cc cc       Specimens: Gallbladder           Complications: none immediately apparent  Procedure In Detail: The patient was seen again in the holding room. The benefits, complications, treatment options, and expected outcomes were discussed with the patient. The risks of bleeding, infection, recurrence of symptoms, failure to resolve symptoms, bile duct damage, bile duct leak, retained common bile duct stone, bowel injury, any of which could require further surgery and/or ERCP, stent, or papillotomy were reviewed with the patient. The likelihood of improving the patient's symptoms with return to their baseline status is good.  The patient and/or family concurred with the proposed plan, giving informed consent.  The patient was taken to operating room, identified and the procedure verified as laparoscopic cholecystectomy.  A time out was held and the above information confirmed.  Prior to the induction of general anesthesia, antibiotic prophylaxis was administered. VTE prophylaxis was in place. General endotracheal anesthesia was then administered and tolerated well. After the induction, the abdomen was prepped with Chloraprep and draped in the sterile fashion. The patient was positioned in the supine position.  Optiview technique was used to enter the abdominal cavity in the right upper quadrant.  Pneumoperitoneum was then created with CO2 and tolerated well without any adverse changes in the patient's vital signs.  Three 8-mm ports and a 12 mm port were placed under direct vision.  A reducing sleeve was placed in the 12 mm port..  All  skin incisions  were infiltrated with a local anesthetic agent before making the incision and placing the trocars.   The patient was positioned  in 15 degrees of reverse Trendelenburg and tilted 8 degrees to the left.  The robot was brought to the surgical field and docked in the standard fashion.  We made sure all the instrumentation was kept in direct view at all times and that there were no collision between the arms.  I scrubbed out and went to the console.  The gallbladder was identified, the fundus grasped and retracted cephalad. Adhesions were lysed bluntly. The infundibulum was grasped and retracted laterally, exposing the peritoneum overlying the triangle of Calot. This was then divided and exposed in a blunt fashion. An extended critical view of the cystic duct and cystic artery was obtained.  The cystic duct was clearly identified and bluntly dissected away from the surrounding tissues, as was the cystic artery.  Using ICG cholangiography we visualized the cystic duct and confirmed that there was no aberrant biliary ductal anatomy nor any evidence of bile duct injury.  Both cystic duct and cystic artery were clipped and divided. The gallbladder was taken from the gallbladder fossa in a retrograde fashion with the electrocautery. Hemostasis was achieved with the electrocautery. Inspection of the right upper quadrant was performed. No bleeding, bile duct injury or leak, or bowel injury was noted.  The gallbladder was then placed in an Endopouch bag. The robotic instruments were removed and robotic arms were undocked in the standard fashion.    I scrubbed back in.  The gallbladder was removed via the 12 mm trocar site.  Using the PMI and a Carter-Thomason cone, the 12 mm port site was then closed with  interrupted 0 Vicryl sutures.  The remaining 8 mm ports were removed and pneumoperitoneum was released.  Each port site was closed with deep dermal 3-0 Vicryl.  4-0 subcuticular Monocryl was used to  close the skin. Dermabond was applied, followed by Steri-Strips.  The patient was then awakened, extubated, and taken to the postanesthesia recovery unit in stable condition.   Sponge, lap, and needle counts were reported to be correct number at closure and at the conclusion of the case.          Duanne Guess, MD FACS

## 2019-12-18 NOTE — Anesthesia Postprocedure Evaluation (Addendum)
Anesthesia Post Note  Patient: Tanya Hardin  Procedure(s) Performed: XI ROBOTIC ASSISTED LAPAROSCOPIC CHOLECYSTECTOMY (N/A Abdomen)  Patient location during evaluation: PACU Anesthesia Type: General Level of consciousness: awake and alert and oriented Pain management: pain level controlled Vital Signs Assessment: post-procedure vital signs reviewed and stable Respiratory status: spontaneous breathing, nonlabored ventilation and respiratory function stable Cardiovascular status: blood pressure returned to baseline and stable Postop Assessment: no signs of nausea or vomiting Anesthetic complications: no   No complications documented.   Last Vitals:  Vitals:   12/18/19 1149 12/18/19 1230  BP: (!) 96/55 (!) 97/53  Pulse: (!) 53 71  Resp: 18 16  Temp: (!) 36.2 C   SpO2: 92% 97%    Last Pain:  Vitals:   12/18/19 1149  TempSrc: Temporal  PainSc: 5                  Yurani Fettes

## 2019-12-18 NOTE — Transfer of Care (Signed)
Immediate Anesthesia Transfer of Care Note  Patient: Tanya Hardin  Procedure(s) Performed: XI ROBOTIC ASSISTED LAPAROSCOPIC CHOLECYSTECTOMY (N/A Abdomen)  Patient Location: PACU  Anesthesia Type:General  Level of Consciousness: awake, drowsy and patient cooperative  Airway & Oxygen Therapy: Patient Spontanous Breathing and Patient connected to face mask oxygen  Post-op Assessment: Report given to RN and Post -op Vital signs reviewed and stable  Post vital signs: Reviewed and stable  Last Vitals:  Vitals Value Taken Time  BP 100/72 12/18/19 1003  Temp 36.7 C 12/18/19 1002  Pulse 63 12/18/19 1004  Resp 21 12/18/19 1004  SpO2 100 % 12/18/19 1004  Vitals shown include unvalidated device data.  Last Pain:  Vitals:   12/18/19 1002  TempSrc:   PainSc: Asleep         Complications: No complications documented.

## 2019-12-18 NOTE — Anesthesia Preprocedure Evaluation (Signed)
Anesthesia Evaluation  Patient identified by MRN, date of birth, ID band Patient awake    Reviewed: Allergy & Precautions, NPO status , Patient's Chart, lab work & pertinent test results  History of Anesthesia Complications (+) history of anesthetic complications (nausea only during csection )  Airway Mallampati: II  TM Distance: >3 FB Neck ROM: Full    Dental  (+) Edentulous Upper, Poor Dentition, Missing   Pulmonary neg sleep apnea, COPD,  COPD inhaler, Current Smoker and Patient abstained from smoking.,    breath sounds clear to auscultation- rhonchi (-) wheezing      Cardiovascular Exercise Tolerance: Good hypertension, Pt. on medications (-) CAD, (-) Past MI, (-) Cardiac Stents and (-) CABG  Rhythm:Regular Rate:Normal - Systolic murmurs and - Diastolic murmurs    Neuro/Psych neg Seizures PSYCHIATRIC DISORDERS Anxiety Depression negative neurological ROS     GI/Hepatic Neg liver ROS, GERD  ,  Endo/Other  negative endocrine ROSneg diabetes  Renal/GU negative Renal ROS     Musculoskeletal negative musculoskeletal ROS (+)   Abdominal (+) + obese,   Peds  Hematology  (+) anemia ,   Anesthesia Other Findings Past Medical History: No date: Anemia     Comment:  h/o  No date: Anxiety No date: Chest pain     Comment:  a. 05/2018 MV: EF 55-65%, small, mild, fixed apical ant               and apical defect, most likely artifact. No               ischemia-->Low risk. No date: Complication of anesthesia     Comment:  DURING SECTION, BP DROPPED /N/V No date: COPD (chronic obstructive pulmonary disease) (HCC) No date: Depression No date: Dyspnea     Comment:  DUE TO COPD No date: Essential hypertension No date: GERD (gastroesophageal reflux disease)     Comment:  due to gallbladder No date: History of echocardiogram     Comment:  a. 02/2015 Echo: Ef 60-65%. no rwma. No date: Hyperlipidemia No date: PONV  (postoperative nausea and vomiting)     Comment:  X1 ONLY DURING C SECTION No date: PSVT (paroxysmal supraventricular tachycardia) (HCC) No date: Tobacco abuse   Reproductive/Obstetrics                             Anesthesia Physical Anesthesia Plan  ASA: II  Anesthesia Plan: General   Post-op Pain Management:    Induction: Intravenous  PONV Risk Score and Plan: 1  Airway Management Planned: Oral ETT  Additional Equipment:   Intra-op Plan:   Post-operative Plan: Extubation in OR  Informed Consent: I have reviewed the patients History and Physical, chart, labs and discussed the procedure including the risks, benefits and alternatives for the proposed anesthesia with the patient or authorized representative who has indicated his/her understanding and acceptance.     Dental advisory given  Plan Discussed with: CRNA and Anesthesiologist  Anesthesia Plan Comments:         Anesthesia Quick Evaluation

## 2019-12-19 ENCOUNTER — Other Ambulatory Visit: Payer: Self-pay | Admitting: General Surgery

## 2019-12-19 ENCOUNTER — Telehealth: Payer: Self-pay | Admitting: *Deleted

## 2019-12-19 LAB — SURGICAL PATHOLOGY

## 2019-12-19 MED ORDER — OXYCODONE HCL 5 MG PO TABS
5.0000 mg | ORAL_TABLET | Freq: Four times a day (QID) | ORAL | 0 refills | Status: DC | PRN
Start: 2019-12-19 — End: 2019-12-21

## 2019-12-19 NOTE — Telephone Encounter (Signed)
Called  patient to let her know she can pick up medication at the pharmacy.

## 2019-12-19 NOTE — Telephone Encounter (Signed)
Patient called and stated that she had surgery yesterday by Dr Lady Gary gallbladder. She stated that she is in a lot of pain, she is taking hydrocodone and ibuprofen every 6 hours. She had to sit up just about all night due to the pain. She wants to know if we can give her something a little bit stronger. Please call and advise

## 2019-12-19 NOTE — Telephone Encounter (Signed)
Sent Rx for oxycodone.

## 2019-12-20 ENCOUNTER — Other Ambulatory Visit: Payer: Self-pay

## 2019-12-20 ENCOUNTER — Ambulatory Visit: Payer: Self-pay | Admitting: *Deleted

## 2019-12-20 ENCOUNTER — Encounter: Payer: Self-pay | Admitting: *Deleted

## 2019-12-20 DIAGNOSIS — F17210 Nicotine dependence, cigarettes, uncomplicated: Secondary | ICD-10-CM | POA: Insufficient documentation

## 2019-12-20 DIAGNOSIS — R509 Fever, unspecified: Secondary | ICD-10-CM | POA: Insufficient documentation

## 2019-12-20 DIAGNOSIS — Z79899 Other long term (current) drug therapy: Secondary | ICD-10-CM | POA: Insufficient documentation

## 2019-12-20 DIAGNOSIS — Z20822 Contact with and (suspected) exposure to covid-19: Secondary | ICD-10-CM | POA: Insufficient documentation

## 2019-12-20 DIAGNOSIS — J449 Chronic obstructive pulmonary disease, unspecified: Secondary | ICD-10-CM | POA: Insufficient documentation

## 2019-12-20 DIAGNOSIS — R1084 Generalized abdominal pain: Secondary | ICD-10-CM | POA: Insufficient documentation

## 2019-12-20 DIAGNOSIS — E860 Dehydration: Secondary | ICD-10-CM | POA: Insufficient documentation

## 2019-12-20 DIAGNOSIS — I10 Essential (primary) hypertension: Secondary | ICD-10-CM | POA: Insufficient documentation

## 2019-12-20 LAB — CBC
HCT: 33.8 % — ABNORMAL LOW (ref 36.0–46.0)
Hemoglobin: 11.7 g/dL — ABNORMAL LOW (ref 12.0–15.0)
MCH: 31 pg (ref 26.0–34.0)
MCHC: 34.6 g/dL (ref 30.0–36.0)
MCV: 89.7 fL (ref 80.0–100.0)
Platelets: 199 10*3/uL (ref 150–400)
RBC: 3.77 MIL/uL — ABNORMAL LOW (ref 3.87–5.11)
RDW: 12 % (ref 11.5–15.5)
WBC: 14.8 10*3/uL — ABNORMAL HIGH (ref 4.0–10.5)
nRBC: 0 % (ref 0.0–0.2)

## 2019-12-20 LAB — COMPREHENSIVE METABOLIC PANEL
ALT: 38 U/L (ref 0–44)
AST: 29 U/L (ref 15–41)
Albumin: 3.4 g/dL — ABNORMAL LOW (ref 3.5–5.0)
Alkaline Phosphatase: 63 U/L (ref 38–126)
Anion gap: 10 (ref 5–15)
BUN: 11 mg/dL (ref 6–20)
CO2: 23 mmol/L (ref 22–32)
Calcium: 8.7 mg/dL — ABNORMAL LOW (ref 8.9–10.3)
Chloride: 102 mmol/L (ref 98–111)
Creatinine, Ser: 0.75 mg/dL (ref 0.44–1.00)
GFR, Estimated: >60 mL/min (ref >60)
Glucose, Bld: 125 mg/dL — ABNORMAL HIGH (ref 70–99)
Potassium: 3.8 mmol/L (ref 3.5–5.1)
Sodium: 135 mmol/L (ref 135–145)
Total Bilirubin: 1.1 mg/dL (ref 0.3–1.2)
Total Protein: 7.3 g/dL (ref 6.5–8.1)

## 2019-12-20 LAB — LIPASE, BLOOD: Lipase: 20 U/L (ref 11–51)

## 2019-12-20 LAB — LACTIC ACID, PLASMA: Lactic Acid, Venous: 0.6 mmol/L (ref 0.5–1.9)

## 2019-12-20 MED ORDER — ONDANSETRON 4 MG PO TBDP
4.0000 mg | ORAL_TABLET | Freq: Once | ORAL | Status: AC | PRN
  Administered 2019-12-20: 4 mg via ORAL
  Filled 2019-12-20: qty 1

## 2019-12-20 NOTE — ED Triage Notes (Signed)
Pt to ED reporting generalized aching and temp of 100.4 at home with vomiting starting today. Pt had her gallbladder removed on Monday and reports she has been taking Vicodin and percocet without relief of pain. Pt reports the vomiting started after starting the percocet.   Bruising noted around the incision site but it appears appropriate for level of healing. No heat or redness noted.

## 2019-12-20 NOTE — Telephone Encounter (Signed)
Best contact: [phone]   Pt is experiencing Vomiting, fever, nausea. She believes this is in response to Percocet that she is taking. Wants to speak to nurse      Patient reports she has a fever of 100.9 and has vomited x 1 and nausea. Patient had lap. Cholecystectomy on 12/18/19 Monday. C/o feeling "terrible" and has decided to go to ED. Care advise given. Patient's sister on phone and verbalized understanding of care advise and to call back or go to ED if symptoms worsen.   Reason for Disposition . Fever > 100.4 F (38.0 C)  Answer Assessment - Initial Assessment Questions 1. SYMPTOM: "What's the main symptom you're concerned about?" (e.g., pain, fever, vomiting)     Fever, nausea and vomiting  2. ONSET: "When did symptoms   start?"     Today  3. SURGERY: "What surgery was performed?"     Lap chole 4. DATE of SURGERY: "When was surgery performed?"      12/18/19 Monday  5. ANESTHESIA: " What type of anesthesia did you have?" (e.g., general, spinal, epidural, local)     na 6. PAIN: "Is there any pain?" If Yes, ask: "How bad is it?"  (Scale 1-10; or mild, moderate, severe)     Moderate sharp at times 7. FEVER: "Do you have a fever?" If Yes, ask: "What is your temperature, how was it measured, and when did it start?"     Yes 100.9. started this pm  8. VOMITING: "Is there any vomiting?" If yes, ask: "How many times?"     Yes x 1  9. BLEEDING: "Is there any bleeding?" If Yes, ask: "How much?" and "Where?"     no 10. OTHER SYMPTOMS: "Do you have any other symptoms?" (e.g., drainage from wound, painful urination, constipation)       nausea  Protocols used: POST-OP SYMPTOMS AND QUESTIONS-A-AH

## 2019-12-21 ENCOUNTER — Encounter: Payer: Self-pay | Admitting: Radiology

## 2019-12-21 ENCOUNTER — Emergency Department
Admission: EM | Admit: 2019-12-21 | Discharge: 2019-12-21 | Disposition: A | Discharge status: Home / Self Care | Payer: Self-pay | Attending: Emergency Medicine | Admitting: Emergency Medicine

## 2019-12-21 ENCOUNTER — Emergency Department: Payer: Self-pay

## 2019-12-21 DIAGNOSIS — R1084 Generalized abdominal pain: Secondary | ICD-10-CM

## 2019-12-21 DIAGNOSIS — E860 Dehydration: Secondary | ICD-10-CM

## 2019-12-21 DIAGNOSIS — R112 Nausea with vomiting, unspecified: Secondary | ICD-10-CM

## 2019-12-21 LAB — URINALYSIS, COMPLETE (UACMP) WITH MICROSCOPIC
Bacteria, UA: NONE SEEN
Bilirubin Urine: NEGATIVE
Glucose, UA: NEGATIVE mg/dL
Ketones, ur: 5 mg/dL — AB
Leukocytes,Ua: NEGATIVE
Nitrite: NEGATIVE
Protein, ur: NEGATIVE mg/dL
Specific Gravity, Urine: 1.042 — ABNORMAL HIGH (ref 1.005–1.030)
pH: 6 (ref 5.0–8.0)

## 2019-12-21 LAB — RESP PANEL BY RT-PCR (FLU A&B, COVID) ARPGX2
Influenza A by PCR: NEGATIVE
Influenza B by PCR: NEGATIVE
SARS Coronavirus 2 by RT PCR: NEGATIVE

## 2019-12-21 MED ORDER — TRAMADOL HCL 50 MG PO TABS
50.0000 mg | ORAL_TABLET | Freq: Four times a day (QID) | ORAL | 0 refills | Status: DC | PRN
Start: 2019-12-21 — End: 2020-01-04

## 2019-12-21 MED ORDER — IOHEXOL 300 MG/ML  SOLN
100.0000 mL | Freq: Once | INTRAMUSCULAR | Status: AC | PRN
  Administered 2019-12-21: 100 mL via INTRAVENOUS

## 2019-12-21 MED ORDER — HYDROMORPHONE HCL 1 MG/ML IJ SOLN
0.5000 mg | Freq: Once | INTRAMUSCULAR | Status: AC
  Administered 2019-12-21: 0.5 mg via INTRAVENOUS
  Filled 2019-12-21: qty 1

## 2019-12-21 MED ORDER — LACTATED RINGERS IV BOLUS
500.0000 mL | Freq: Once | INTRAVENOUS | Status: AC
  Administered 2019-12-21: 500 mL via INTRAVENOUS

## 2019-12-21 MED ORDER — ONDANSETRON HCL 4 MG PO TABS: 4.0000 mg | ORAL_TABLET | Freq: Three times a day (TID) | ORAL | 0 refills | Status: DC | PRN

## 2019-12-21 MED ORDER — LACTATED RINGERS IV BOLUS
1000.0000 mL | Freq: Once | INTRAVENOUS | Status: AC
  Administered 2019-12-21: 1000 mL via INTRAVENOUS

## 2019-12-21 NOTE — ED Notes (Addendum)
Surgical PA at bedside to speak with pt- pt visualized in NAD

## 2019-12-21 NOTE — ED Provider Notes (Signed)
Patient seen and cleared for d/c by Dr. Leonia Reader, MD 12/21/19 [phone]

## 2019-12-21 NOTE — Consult Note (Signed)
[facility] SURGICAL CONSULTATION NOTE (initial) - cpt: 84696: [id number]   HISTORY OF PRESENT ILLNESS (HPI):  54 y.o. female presented to [facility] ED overnight for evaluation of post-op pain. Patient recently underwent robotic assisted laparoscopic cholecystectomy with Dr Lady Garyannon on 11/29. Since that time, she has been reporting persistent abdominal pain. Pain was initially sharp and localized to her right abdomen with radiation to her shoulders however this ultimately became a more severe and generalized soreness. She tried narcotic pain medication at home without much relief in her symptoms. Endorsed associated nausea, non-bloody and non-bilious emesis, and fever to 100.77F at home. She believes the narcotic pain medication is attributable to her nausea and emesis. No chills, cough, CP, SOB, urinary changes, or bowel changes. Only other intra-abdominal surgeries is a c-section x2. Work up in the ED revealed a slight leukocytosis to 14.8K but labs were otherwise reassuring. She did undergo CT Abdomen/Pelvis which showed likely hematoma in the left abdominal wall, there is expected pneumoperitoneum in setting of recent surgery, and some stranding of the mesentery near the right transverse colon.    Surgery is consulted by emergency medicine physician Dr. Antoine PrimasZachary Smith, MD in this context for evaluation and management of post-operative pain.  PAST MEDICAL HISTORY (PMH):  Past Medical History:  Diagnosis Date  . Anemia    h/o   . Anxiety   . Chest pain    a. 05/2018 MV: EF 55-65%, small, mild, fixed apical ant and apical defect, most likely artifact. No ischemia-->Low risk.  . Complication of anesthesia    DURING SECTION, BP DROPPED /N/V  . COPD (chronic obstructive pulmonary disease) (HCC)   . Depression   . Dyspnea    DUE TO COPD  . Essential hypertension   . GERD (gastroesophageal reflux disease)    due to gallbladder  . History of echocardiogram    a. 02/2015 Echo: Ef 60-65%. no rwma.  .  Hyperlipidemia   . PONV (postoperative nausea and vomiting)    X1 ONLY DURING C SECTION  . PSVT (paroxysmal supraventricular tachycardia) (HCC)   . Tobacco abuse      PAST SURGICAL HISTORY (PSH):  Past Surgical History:  Procedure Laterality Date  . CESAREAN SECTION     x2  . MOUTH SURGERY       MEDICATIONS:  Prior to Admission medications   Medication Sig Start Date End Date Taking? Authorizing Provider  albuterol (VENTOLIN HFA) 108 (90 Base) MCG/ACT inhaler Inhale 2 puffs into the lungs every 6 (six) hours as needed for wheezing or shortness of breath. 12/08/19 01/30/21  Karamalegos, Netta NeatAlexander J, DO  buPROPion (WELLBUTRIN SR) 150 MG 12 hr tablet Take 1 tablet (150 mg total) by mouth 2 (two) times daily. Patient not taking: Reported on 12/08/2019 12/08/19   Smitty CordsKaramalegos, Alexander J, DO  FLUoxetine (PROZAC) 10 MG tablet Take 1 tablet (10 mg total) by mouth daily. Patient taking differently: Take 10 mg by mouth every morning.  12/08/19   Karamalegos, Netta NeatAlexander J, DO  ibuprofen (ADVIL) 800 MG tablet Take 1 tablet (800 mg total) by mouth every 8 (eight) hours as needed. 12/18/19   Duanne Guessannon, Jennifer, MD  metoprolol tartrate (LOPRESSOR) 25 MG tablet TAKE 1 TABLET BY MOUTH TWICE A DAY 08/15/19   Duke SalviaKlein, Steven C, MD  ondansetron (ZOFRAN) 4 MG tablet Take 1 tablet (4 mg total) by mouth every 8 (eight) hours as needed for up to 10 doses for nausea or vomiting. 12/21/19   Gilles ChiquitoSmith, Lucindia Lemley P, MD  oxyCODONE (ROXICODONE) 5  MG immediate release tablet Take 1 tablet (5 mg total) by mouth every 6 (six) hours as needed. 12/19/19 12/18/20  Duanne Guess, MD     ALLERGIES:  Allergies  Allergen Reactions  . Codeine Itching, Nausea And Vomiting and Other (See Comments)    PT STATES SHE FELT NAUSEATED AND DID NOT LIKE THE WAY IT MADE HER FEEL     SOCIAL HISTORY:  Social History   Socioeconomic History  . Marital status: Single    Spouse name: Not on file  . Number of children: Not on file  . Years  of education: Not on file  . Highest education level: Not on file  Occupational History  . Not on file  Tobacco Use  . Smoking status: Current Every Day Smoker    Packs/day: 1.50    Years: 30.00    Pack years: 45.00    Types: Cigarettes  . Smokeless tobacco: Never Used  Vaping Use  . Vaping Use: Never used  Substance and Sexual Activity  . Alcohol use: Not Currently  . Drug use: Not Currently    Comment: LAST USED WHILE IN HER 20'S  . Sexual activity: Not on file  Other Topics Concern  . Not on file  Social History Narrative  . Not on file   Social Determinants of Health   Financial Resource Strain:   . Difficulty of Paying Living Expenses: Not on file  Food Insecurity:   . Worried About Programme researcher, broadcasting/film/video in the Last Year: Not on file  . Ran Out of Food in the Last Year: Not on file  Transportation Needs:   . Lack of Transportation (Medical): Not on file  . Lack of Transportation (Non-Medical): Not on file  Physical Activity:   . Days of Exercise per Week: Not on file  . Minutes of Exercise per Session: Not on file  Stress:   . Feeling of Stress : Not on file  Social Connections:   . Frequency of Communication with Friends and Family: Not on file  . Frequency of Social Gatherings with Friends and Family: Not on file  . Attends Religious Services: Not on file  . Active Member of Clubs or Organizations: Not on file  . Attends Banker Meetings: Not on file  . Marital Status: Not on file  Intimate Partner Violence:   . Fear of Current or Ex-Partner: Not on file  . Emotionally Abused: Not on file  . Physically Abused: Not on file  . Sexually Abused: Not on file     FAMILY HISTORY:  Family History  Family history unknown: Yes      REVIEW OF SYSTEMS:  Review of Systems  Constitutional: Positive for fever. Negative for chills.  HENT: Negative for congestion and sore throat.   Respiratory: Negative for cough and shortness of breath.    Cardiovascular: Negative for chest pain and palpitations.  Gastrointestinal: Positive for abdominal pain, nausea and vomiting. Negative for blood in stool, constipation, diarrhea and melena.  Genitourinary: Negative for dysuria and urgency.  All other systems reviewed and are negative.   VITAL SIGNS:  Temp:  [99.4 F (37.4 C)] 99.4 F (37.4 C) (12/01 2141) Pulse Rate:  [73-102] 73 (12/02 0632) Resp:  [16-18] 17 (12/02 7619) BP: (102-115)/(52-59) 103/54 (12/02 5093) SpO2:  [94 %-97 %] 94 % (12/02 2671) Weight:  [83 kg] 83 kg (12/01 2142)     Height: 5\' 2"  (157.5 cm) Weight: 83 kg BMI (Calculated): 33.46   INTAKE/OUTPUT:  12/01 0701 - 12/02 0700 In: 1000 [IV Piggyback:1000] Out: -   PHYSICAL EXAM:  Physical Exam Vitals and nursing note reviewed. Exam conducted with a chaperone present.  Constitutional:      General: She is not in acute distress.    Appearance: Normal appearance. She is obese. She is not ill-appearing.  HENT:     Head: Normocephalic and atraumatic.  Eyes:     General: No scleral icterus.    Conjunctiva/sclera: Conjunctivae normal.     Pupils: Pupils are equal, round, and reactive to light.  Cardiovascular:     Rate and Rhythm: Normal rate and regular rhythm.     Pulses: Normal pulses.     Heart sounds: No murmur heard.   Pulmonary:     Effort: Pulmonary effort is normal. No respiratory distress.     Breath sounds: Normal breath sounds.  Abdominal:     General: A surgical scar is present. There is no distension.     Palpations: Abdomen is soft.     Tenderness: There is generalized abdominal tenderness. There is no guarding or rebound.       Comments: Abdomen is obese, soft, there is generalized soreness but seems worse in RUQ, non-distended, no rebound/guarding, no evidence of peritonitis   Genitourinary:    Comments: Deferred Musculoskeletal:     Right lower leg: No edema.     Left lower leg: No edema.  Skin:    General: Skin is warm and dry.      Coloration: Skin is not pale.     Findings: No erythema.  Neurological:     General: No focal deficit present.     Mental Status: She is alert and oriented to person, place, and time.  Psychiatric:        Mood and Affect: Mood normal.        Behavior: Behavior normal.      Labs:  CBC Latest Ref Rng & Units 12/20/2019 11/06/2019 10/11/2019  WBC 4.0 - 10.5 K/uL 14.8(H) 9.7 9.8  Hemoglobin 12.0 - 15.0 g/dL 11.7(L) 14.2 14.2  Hematocrit 36 - 46 % 33.8(L) 40.7 39.5  Platelets 150 - 400 K/uL 199 220 244   CMP Latest Ref Rng & Units 12/20/2019 11/06/2019 10/11/2019  Glucose 70 - 99 mg/dL 970(Y) 637(C) 588(F)  BUN 6 - 20 mg/dL 11 15 13   Creatinine 0.44 - 1.00 mg/dL 0.27 7.41  Sodium 135 - 145 mmol/L 135 139 138  Potassium 3.5 - 5.1 mmol/L 3.8 3.8 4.7  Chloride 98 - 111 mmol/L 102 104 101  CO2 22 - 32 mmol/L 23 24 26   Calcium 8.9 - 10.3 mg/dL 2.87) ) 9.2  Total Protein 6.5 - 8.1 g/dL 7.3 7.9 8.6(V)  Total Bilirubin 0.3 - 1.2 mg/dL 1.1 0.8 0.5  Alkaline Phos 38 - 126 U/L 63 67 61  AST 15 - 41 U/L 29 63(H) 15  ALT 0 - 44 U/L 38 36 20     Imaging studies:   CT Abdomen/Pelvis (12/21/2019) personally reviewed which shows expected pneumoperitoneum in setting of recent surgery, there does appear to be likely hematoma in the left abdominal wall, there is some stranding of the right transverse colon mesentery with a small bubble of air but etiology is difficult in setting of recent surgery, and radiologist report reviewed:  IMPRESSION: 1. Recent cholecystectomy with no gallbladder fossa fluid or bile duct dilatation. 2. Tract of gas with stranding at the transverse mesocolon, question if there was trocar placement in this  area. There is pneumoperitoneum which is expected after recent surgery. 3. Extraperitoneal hematoma in the left ventral abdomen.   Assessment/Plan: (ICD-10's: R10.81) 54 y.o. female with post-operative abdominal pain, nausea, emesis found to have left abdominal  wall hematoma and expected pneumoperitoneum in setting of recent cholecystectomy on 11/29.   - She is feeling much better this morning since getting IVF resuscitation and pain control. She is tender expectedly in the setting of recent surgery and there is ecchymosis around her camera port site. There is NO evidence of peritonitis. We will stop oxycodone and hydrocodone, and switch to Tramadol. She will continue alternating tylenol and Motrin as well for pain. She was educated on signs and symptoms which should prompt return including, but not limited to, fever >101, severe abdominal pain, persistent nausea, emesis, which she voiced understanding of. I will move her appointment to next week for closer follow up. She is stable for discharge home from surgical standpoint. Discussed with EDP.   All of the above findings and recommendations were discussed with the patient, and all of patient's questions were answered to her expressed satisfaction.  Thank you for the opportunity to participate in this patient's care.   -- Lynden Oxford, PA-C [facility] 12/21/2019, 7:13 AM [phone] M-F: 7am - 4pm

## 2019-12-21 NOTE — ED Provider Notes (Addendum)
[facility] Emergency Department Provider Note  ____________________________________________   First MD Initiated Contact with Patient 12/21/19 [phone]     (approximate)  I have reviewed the triage vital signs and the nursing notes.   HISTORY  Chief Complaint Generalized Body Aches and Emesis   [name] is a 54 y.o. female with a past medical history of COPD, HDL, GAD, CAD, anemia, depression, HTN, and GERD as well as recent cholecystectomy on 11/29 who presents for assessment of some persistent abdominal pain since then associated with nonbloody nonbilious vomiting today and development of a fever up to 100.4 at home.  Patient states she has not had a bowel movement since being discharged but she is passing gas.  She denies any headache, earache, cough cough, shortness of breath, chest pain, back pain, burning with urination, blood in her stool, rash or extremity pain or other acute complaints.  No recent traumatic injuries or falls.  She states she has been taking some Vicodin at home and she took them today proceeding her vomiting and is not sure if this is related or causing her vomiting.       Past Medical History:  Diagnosis Date  . Anemia    h/o   . Anxiety   . Chest pain    a. 05/2018 MV: EF 55-65%, small, mild, fixed apical ant and apical defect, most likely artifact. No ischemia-->Low risk.  . Complication of anesthesia    DURING SECTION, BP DROPPED /N/V  . COPD (chronic obstructive pulmonary disease) (HCC)   . Depression   . Dyspnea    DUE TO COPD  . Essential hypertension   . GERD (gastroesophageal reflux disease)    due to gallbladder  . History of echocardiogram    a. 02/2015 Echo: Ef 60-65%. no rwma.  . Hyperlipidemia   . PONV (postoperative nausea and vomiting)    X1 ONLY DURING C SECTION  . PSVT (paroxysmal supraventricular tachycardia) (HCC)   . Tobacco abuse     Patient Active Problem List   Diagnosis Date Noted  . GAD  (generalized anxiety disorder) 12/08/2019  . Major depressive disorder, recurrent, moderate (HCC) 12/08/2019  . Xiphoid pain 12/08/2019  . Centrilobular emphysema (HCC) 12/08/2019  . Paroxysmal supraventricular tachycardia (HCC) 02/21/2015  . Tobacco use 02/21/2015    Past Surgical History:  Procedure Laterality Date  . CESAREAN SECTION     x2  . MOUTH SURGERY      Prior to Admission medications   Medication Sig Start Date End Date Taking? Authorizing Provider  albuterol (VENTOLIN HFA) 108 (90 Base) MCG/ACT inhaler Inhale 2 puffs into the lungs every 6 (six) hours as needed for wheezing or shortness of breath. 12/08/19 01/30/21  Karamalegos, Netta Neat, DO  buPROPion (WELLBUTRIN SR) 150 MG 12 hr tablet Take 1 tablet (150 mg total) by mouth 2 (two) times daily. Patient not taking: Reported on 12/08/2019 12/08/19   Smitty Cords, DO  FLUoxetine (PROZAC) 10 MG tablet Take 1 tablet (10 mg total) by mouth daily. Patient taking differently: Take 10 mg by mouth every morning.  12/08/19   Karamalegos, Netta Neat, DO  ibuprofen (ADVIL) 800 MG tablet Take 1 tablet (800 mg total) by mouth every 8 (eight) hours as needed. 12/18/19   Duanne Guess, MD  metoprolol tartrate (LOPRESSOR) 25 MG tablet TAKE 1 TABLET BY MOUTH TWICE A DAY 08/15/19   Duke Salvia, MD  ondansetron (ZOFRAN) 4 MG tablet Take 1 tablet (4 mg total)  by mouth every 8 (eight) hours as needed for up to 10 doses for nausea or vomiting. 12/21/19   Gilles ChiquitoSmith, Icel Castles P, MD  oxyCODONE (ROXICODONE) 5 MG immediate release tablet Take 1 tablet (5 mg total) by mouth every 6 (six) hours as needed. 12/19/19 12/18/20  Duanne Guessannon, Jennifer, MD    Allergies Codeine  Family History  Family history unknown: Yes    Social History Social History   Tobacco Use  . Smoking status: Current Every Day Smoker    Packs/day: 1.50    Years: 30.00    Pack years: 45.00    Types: Cigarettes  . Smokeless tobacco: Never Used  Vaping Use  .  Vaping Use: Never used  Substance Use Topics  . Alcohol use: Not Currently  . Drug use: Not Currently    Comment: LAST USED WHILE IN HER 20'S    Review of Systems  Review of Systems  Constitutional: Positive for malaise/fatigue. Negative for chills and fever.  HENT: Negative for sore throat.   Eyes: Negative for pain.  Respiratory: Negative for cough and stridor.   Cardiovascular: Negative for chest pain.  Gastrointestinal: Positive for abdominal pain, constipation, nausea and vomiting.  Genitourinary: Negative for dysuria.  Musculoskeletal: Negative for myalgias.  Skin: Negative for rash.  Neurological: Negative for seizures, loss of consciousness and headaches.  Psychiatric/Behavioral: Negative for suicidal ideas.  All other systems reviewed and are negative.     ____________________________________________   PHYSICAL EXAM:  VITAL SIGNS: ED Triage Vitals  Enc Vitals Group     BP 12/20/19 2141 (!) 104/59     Pulse Rate 12/20/19 2141 (!) 102     Resp 12/20/19 2141 16     Temp 12/20/19 2141 99.4 F (37.4 C)     Temp Source 12/20/19 2141 Oral     SpO2 12/20/19 2141 97 %     Weight 12/20/19 2142 182 lb 15.7 oz (83 kg)     Height 12/20/19 2142 5\' 2"  (1.575 m)     Head Circumference --      Peak Flow --      Pain Score 12/20/19 2142 10     Pain Loc --      Pain Edu? --      Excl. in GC? --    Vitals:   12/21/19 0558 12/21/19 0632  BP: (!) 102/57 (!) 103/54  Pulse: 77 73  Resp: 18 17  Temp:    SpO2: 97% 94%   Physical Exam Vitals and nursing note reviewed.  Constitutional:      General: She is not in acute distress.    Appearance: She is well-developed.  HENT:     Head: Normocephalic and atraumatic.     Right Ear: External ear normal.     Left Ear: External ear normal.     Nose: Nose normal.     Mouth/Throat:     Mouth: Mucous membranes are dry.  Eyes:     Conjunctiva/sclera: Conjunctivae normal.  Cardiovascular:     Rate and Rhythm: Regular rhythm.  Tachycardia present.     Heart sounds: No murmur heard.   Pulmonary:     Effort: Pulmonary effort is normal. No respiratory distress.     Breath sounds: Normal breath sounds.  Abdominal:     General: There is distension.     Palpations: Abdomen is soft.     Tenderness: There is abdominal tenderness. There is no right CVA tenderness or left CVA tenderness.  Musculoskeletal:  Cervical back: Neck supple.  Skin:    General: Skin is warm and dry.     Capillary Refill: Capillary refill takes 2 to 3 seconds.  Neurological:     Mental Status: She is alert and oriented to person, place, and time.  Psychiatric:        Mood and Affect: Mood normal.     Laparoscopic abdominal incision sites appear clean dry and intact.  There is some ecchymosis around the suprapubic site but no streaking, induration, warmth or focal tenderness.  Patient's abdomen is distended and globally tender. ____________________________________________   LABS (all labs ordered are listed, but only abnormal results are displayed)  Labs Reviewed  COMPREHENSIVE METABOLIC PANEL - Abnormal; Notable for the following components:      Result Value   Glucose, Bld 125 (*)    Calcium 8.7 (*)    Albumin 3.4 (*)    All other components within normal limits  CBC - Abnormal; Notable for the following components:   WBC 14.8 (*)    RBC 3.77 (*)    Hemoglobin 11.7 (*)    HCT 33.8 (*)    All other components within normal limits  URINALYSIS, COMPLETE (UACMP) WITH MICROSCOPIC - Abnormal; Notable for the following components:   Color, Urine STRAW (*)    APPearance CLEAR (*)    Specific Gravity, Urine 1.042 (*)    Hgb urine dipstick SMALL (*)    Ketones, ur 5 (*)    All other components within normal limits  RESP PANEL BY RT-PCR (FLU A&B, COVID) ARPGX2  LIPASE, BLOOD  LACTIC ACID, PLASMA   ____________________________________________  ____________________________________________  RADIOLOGY   Official radiology  report(s): CT ABDOMEN PELVIS W CONTRAST  Result Date: 12/21/2019 CLINICAL DATA:  Generalized aching abdominal pain with low-grade fever EXAM: CT ABDOMEN AND PELVIS WITH CONTRAST TECHNIQUE: Multidetector CT imaging of the abdomen and pelvis was performed using the standard protocol following bolus administration of intravenous contrast. CONTRAST:  OMNIPAQUE IOHEXOL 300 MG/ML  SOLN COMPARISON:  None. FINDINGS: Lower chest:  Mild dependent atelectasis Hepatobiliary: No focal liver abnormality.Cholecystectomy. No bile duct dilatation or gallbladder fossa collection. Pancreas: Unremarkable. Spleen: Unremarkable. Adrenals/Urinary Tract: Negative adrenals. No hydronephrosis or stone. Unremarkable bladder. Stomach/Bowel: There is a track like area of gas in fluid with adjacent fat stranding at the transverse mesocolon. No adjacent diverticula or clear colonic wall thickening. Negative for ileus. Vascular/Lymphatic: Atheromatous calcification which is notable for age. No mass or adenopathy. Reproductive:No pathologic findings. Other: Pneumoperitoneum and abdominal wall gas consistent with recent surgery. There is a hematoma in the left ventral abdomen which measures 13 cm craniocaudal and 2.2 cm in thickness. The hematoma is extraperitoneal in presumably from abdominal wall access. Musculoskeletal: No acute abnormalities. IMPRESSION: 1. Recent cholecystectomy with no gallbladder fossa fluid or bile duct dilatation. 2. Tract of gas with stranding at the transverse mesocolon, question if there was trocar placement in this area. There is pneumoperitoneum which is expected after recent surgery. 3. Extraperitoneal hematoma in the left ventral abdomen. Electronically Signed   By: Marnee Spring M.D.   On: 12/21/2019 05:17    ____________________________________________   PROCEDURES  Procedure(s) performed (including Critical Care):  .1-3 Lead EKG Interpretation Performed by: Gilles Chiquito, MD Authorized  by: Gilles Chiquito, MD     Interpretation: normal     ECG rate assessment: normal     Rhythm: sinus rhythm     Ectopy: none     Conduction: normal  ____________________________________________   INITIAL IMPRESSION / ASSESSMENT AND PLAN / ED COURSE        Patient presents with [place] to history exam for assessment of abdominal pain after recent above-noted cholecystectomy as well as some nonbloody nonbilious vomiting today and a fever as well as persistent constipation.  On arrival patient is tachycardic with borderline low blood pressure with a BP of 104/59 otherwise stable vital signs on room air.  Exam as above remarkable for blood to 3 well-appearing incision sites with abdomen is otherwise grossly tender but not peritoneal neck and somewhat distended.  Patient also does appear dehydrated with dry mucous membranes and decreased cap refill.  Differential includes but is not limited to acute postop infection, SBO, postop ileus, viral gastritis, pancreatitis, cystitis, versus other intra-abdominal infection.  No respiratory symptoms to suggest pneumonia. UA does not appear infected.  CT obtained does show evidence of some expected postop intra-abdominal air and some inflammation around the transverse colon without any evidence of perforation which is likely secondary to placement of trocar per radiology. I specifically discussed this with Dr. Elmon Kirschner on-call radiologist who read patient's CT. There is no abscess, SBO, or other acute intra-abdominal process on CT. I discussed patient's presentation and work-up with Dr. Aleen Campi who also reviewed patient's CT and stated that patient was feeling better she could likely go home with plan for close outpatient follow-up.  However he did request that patient remain in the ED until she can be seen by their PA who will come see the patient shortly after 7 AM and if there is assessment she is still stable she'll likely be safe for discharge.  This plan  was communicated to oncoming provider at approximately 0 700.  Plan is to follow-up with surgery PA and if deemed safe for discharge discharge with plan for outpatient follow-up.      ____________________________________________   FINAL CLINICAL IMPRESSION(S) / ED DIAGNOSES  Final diagnoses:  Generalized abdominal pain  Nausea and vomiting, intractability of vomiting not specified, unspecified vomiting type  Dehydration    Medications  ondansetron (ZOFRAN-ODT) disintegrating tablet 4 mg (4 mg Oral Given 12/20/19 2144)  lactated ringers bolus 1,000 mL (0 mLs Intravenous Stopped 12/21/19 0559)  HYDROmorphone (DILAUDID) injection 0.5 mg (0.5 mg Intravenous Given 12/21/19 0437)  iohexol (OMNIPAQUE) 300 MG/ML solution 100 mL (100 mLs Intravenous Contrast Given 12/21/19 0450)  lactated ringers bolus 500 mL (500 mLs Intravenous New Bag/Given 12/21/19 0649)     ED Discharge Orders         Ordered    ondansetron (ZOFRAN) 4 MG tablet  Every 8 hours PRN        12/21/19 1610           Note:  This document was prepared using Dragon voice recognition software and may include unintentional dictation errors.   Gilles Chiquito, MD 12/21/19 9604    Gilles Chiquito, MD 12/21/19 [phone]

## 2019-12-24 ENCOUNTER — Other Ambulatory Visit: Payer: Self-pay | Admitting: Family Medicine

## 2019-12-24 DIAGNOSIS — Z720 Tobacco use: Secondary | ICD-10-CM

## 2019-12-24 DIAGNOSIS — F331 Major depressive disorder, recurrent, moderate: Secondary | ICD-10-CM

## 2019-12-24 NOTE — Telephone Encounter (Signed)
Requested Prescriptions  Pending Prescriptions Disp Refills  . buPROPion (WELLBUTRIN SR) 150 MG 12 hr tablet [Pharmacy Med Name: BUPROPION HCL SR 150 MG TABLET] 180 tablet 0    Sig: TAKE 1 TABLET BY MOUTH TWICE A DAY     Psychiatry: Antidepressants - bupropion Passed - 12/24/2019  3:53 PM      Passed - Completed PHQ-2 or PHQ-9 in the last 360 days      Passed - Last BP in normal range    BP Readings from Last 1 Encounters:  12/21/19 107/62         Passed - Valid encounter within last 6 months    Recent Outpatient Visits          2 weeks ago Centrilobular emphysema [facility])   [facility] [place], Netta Neat, DO

## 2019-12-26 ENCOUNTER — Encounter: Payer: Self-pay | Admitting: Physician Assistant

## 2020-01-02 ENCOUNTER — Encounter: Payer: Self-pay | Admitting: General Surgery

## 2020-01-04 ENCOUNTER — Other Ambulatory Visit: Payer: Self-pay

## 2020-01-04 ENCOUNTER — Encounter: Payer: Self-pay | Admitting: Physician Assistant

## 2020-01-04 ENCOUNTER — Encounter: Payer: Self-pay | Admitting: General Surgery

## 2020-01-04 ENCOUNTER — Ambulatory Visit (INDEPENDENT_AMBULATORY_CARE_PROVIDER_SITE_OTHER): Payer: Self-pay | Admitting: General Surgery

## 2020-01-04 VITALS — BP 103/67 | HR 78 | Temp 98.1°F | Ht 66.0 in | Wt 185.6 lb

## 2020-01-04 DIAGNOSIS — Z9049 Acquired absence of other specified parts of digestive tract: Secondary | ICD-10-CM

## 2020-01-04 NOTE — Progress Notes (Signed)
Tanya Hardin is here today for a postop visit.  She underwent a robot-assisted laparoscopic cholecystectomy on December 18, 2019.  She states that she has done well since her operation.  She denies any fevers or chills.  No nausea or vomiting.  She does report having had some loose stools in the past 24 hours, but also says that she ate some fatty food.  She reports some swelling in her left upper abdomen, just below the costal margin.  Today's Vitals   01/04/20 1037  BP: 103/67  Pulse: 78  Temp: 98.1 F (36.7 C)  TempSrc: Oral  SpO2: 97%  Weight: 185 lb 9.6 oz (84.2 kg)  Height: 5\' 6"  (1.676 m)   Body mass index is 29.96 kg/m. Focused abdominal exam: All of her laparoscopic port sites are healing nicely.  There is no erythema, induration, or drainage present.  She points to the area where the Veress needle was used as the area of swelling that she is wondering about.  No mass is palpable, nor is there any external visible sign of swelling.  Impression and plan: This is a 54 year old woman who had a robot-assisted laparoscopic cholecystectomy.  Overall, she is doing well.  I think her diarrhea over the last 24 hours can likely be explained by dietary indiscretion.  She was cautioned to try to avoid fatty or fried foods for another few weeks and then gradually reintroduce them.  The area of swelling that she is concerned about is likely due to swelling/inflammation or a possible hematoma from Veress needle placement.  During the operation, I was unable to get good access to the abdomen with a Veress needle and several attempts were made.  I ultimately resorted to the right upper quadrant Optiview entrance, as I did not feel comfortable proceeding with additional Veress needle attempts.  I reassured her that it was likely to resolve over the next few weeks to months and that it did not raise any significant concern in my mind.  She should continue to observe the lifting restrictions for a total of 3  weeks from the time of her operation, but otherwise, she may resume all of her usual activities.

## 2020-01-04 NOTE — Patient Instructions (Addendum)
The swelling and soreness will get better with time, possible a couple of months. This is due to the difficulty of your procedure.   GENERAL POST-OPERATIVE PATIENT INSTRUCTIONS   WOUND CARE INSTRUCTIONS:  Keep a dry clean dressing on the wound if there is drainage. The initial bandage may be removed after 24 hours.  Once the wound has quit draining you may leave it open to air.  If clothing rubs against the wound or causes irritation and the wound is not draining you may cover it with a dry dressing during the daytime.  Try to keep the wound dry and avoid ointments on the wound unless directed to do so.  If the wound becomes bright red and painful or starts to drain infected material that is not clear, please contact your physician immediately.  If the wound is mildly pink and has a thick firm ridge underneath it, this is normal, and is referred to as a healing ridge.  This will resolve over the next 4-6 weeks.  BATHING: You may shower if you have been informed of this by your surgeon. However, Please do not submerge in a tub, hot tub, or pool until incisions are completely sealed or have been told by your surgeon that you may do so.  DIET:  You may eat any foods that you can tolerate.  It is a good idea to eat a high fiber diet and take in plenty of fluids to prevent constipation.  If you do become constipated you may want to take a mild laxative or take ducolax tablets on a daily basis until your bowel habits are regular.  Constipation can be very uncomfortable, along with straining, after recent surgery.  ACTIVITY:  You are encouraged to cough and deep breath or use your incentive spirometer if you were given one, every 15-30 minutes when awake. This will help prevent respiratory complications and low grade fevers post-operatively if you had a general anesthetic. You may want to hug a pillow when coughing and sneezing to add additional support to the surgical area, if you had abdominal or chest  surgery, which will decrease pain during these times.  You are encouraged to walk and engage in light activity for the next two weeks.  You should not lift more than 20 pounds, until 01/29/2020 as it could put you at increased risk for complications.  Twenty pounds is roughly equivalent to a plastic bag of groceries. At that time- Listen to your body when lifting, if you have pain when lifting, stop and then try again in a few days. Soreness after doing exercises or activities of daily living is normal as you get back in to your normal routine.  MEDICATIONS:  Try to take narcotic medications and anti-inflammatory medications, such as tylenol, ibuprofen, naprosyn, etc., with food.  This will minimize stomach upset from the medication.  Should you develop nausea and vomiting from the pain medication, or develop a rash, please discontinue the medication and contact your physician.  You should not drive, make important decisions, or operate machinery when taking narcotic pain medication.  SUNBLOCK Use sun block to incision area over the next year if this area will be exposed to sun. This helps decrease scarring and will allow you avoid a permanent darkened area over your incision.  QUESTIONS:  Please feel free to call our office if you have any questions, and we will be glad to assist you.

## 2020-01-09 ENCOUNTER — Inpatient Hospital Stay: Payer: Self-pay | Attending: Oncology | Admitting: Oncology

## 2020-01-09 DIAGNOSIS — Z720 Tobacco use: Secondary | ICD-10-CM

## 2020-01-09 DIAGNOSIS — Z87891 Personal history of nicotine dependence: Secondary | ICD-10-CM

## 2020-01-09 MED ORDER — NICOTINE POLACRILEX 4 MG MT LOZG: 4.0000 mg | LOZENGE | OROMUCOSAL | 0 refills | Status: DC | PRN

## 2020-01-09 MED ORDER — NICOTINE 21 MG/24HR TD PT24: 21.0000 mg | MEDICATED_PATCH | Freq: Every day | TRANSDERMAL | 0 refills | Status: DC

## 2020-01-09 NOTE — Progress Notes (Signed)
Smoking Cessation Clinic  [facility]  [phone] Fax:([phone]  Patient Care Team: Smitty Cords, DO as PCP - General (Family Medicine) Iran Ouch, MD as PCP - Cardiology (Cardiology)   Name of the patient: Tanya Hardin  [id number]  [birth year]   Date of visit: 01/09/2020   Chief complaint/ Reason for visit- Smoking Cessation counseling  PMH-Tanya Hardin is a 54 year old female with past medical history significant for intermittent tachycardia currently managed with metoprolol, hypertension, COPD and tobacco dependence.  She was referred from our low-dose CT screening program.  She is scheduled to have her gallbladder removed at the end of November and the surgeon wishes for her to quit smoking.  Tanya Hardin unfortunately never picked up her Chantix.  She had her gallbladder removed on 12/18/2019.  She recovered well.  She is interested in trying nicotine patches and OTC nicotine gum or lozenges.   Interval history- Tanya Hardin is a 54 yo patient who presents to smoking cessation clinic to discuss strategies and techniques to quit smoking.  The goal of this program is to implement proven ineffective interventions in a clinical setting to successfully quit smoking.   Smoking history: Tobacco Use: High Risk  . Smoking Tobacco Use: Current Every Day Smoker  . Smokeless Tobacco Use: Never Used     Type of tobacco:   Cigarettes   Approximate date of last quit attempt: 1-2 year ago    Longest period of time quit in the past:9 months-long enough to have her daughter   What caused the relapse: Stress   Medication used in the past:  Nicotine patch  Nicotine gum  Nicotine lozenge  Nicotine nasal spray  Nicotine oral inhaler  Cold [place]  Readiness to quit: Patient states she is ready to quit.  Other smokers in household: yes  Plan: Quit date: TBD  Today, she states she is willing to try to quit but has no  health insurance or money.  She is currently unemployed secondary to shortness of breath with exertion.  Her shortness of breath has become progressive over the past year.  She was started on inhalers for her COPD which she is currently not using secondary to expense.  She is able to afford her metoprolol for her tachycardia and Prozac for her anxiety/depression.  She is asking for help to apply for Medicaid or count financial assistance over the phone as she has very limited transportation.  She currently smokes 1 to 1-1/2 packs of cigarettes per day.  She has been smoking since she was 54 years old.  She has tried many medications to quit smoking and none of them have worked.  She is interested in trying Chantix but cannot afford it.  She unfortunately never picked up her Chantix.  She had her surgery and is interested in quitting smoking using a different method.  ECOG FS:1 - Symptomatic but completely ambulatory  Review of systems- Review of Systems  Constitutional: Negative.  Negative for chills, fever, malaise/fatigue and weight loss.  HENT: Negative for congestion, ear pain and tinnitus.   Eyes: Negative.  Negative for blurred vision and double vision.  Respiratory: Positive for shortness of breath. Negative for cough and sputum production.   Cardiovascular: Negative.  Negative for chest pain, palpitations and leg swelling.  Gastrointestinal: Negative.  Negative for abdominal pain, constipation, diarrhea, nausea and vomiting.  Genitourinary: Negative for dysuria, frequency and urgency.  Musculoskeletal: Negative for back pain and falls.  Skin: Negative.  Negative for rash.  Neurological: Negative.  Negative for weakness and headaches.  Endo/Heme/Allergies: Negative.  Does not bruise/bleed easily.  Psychiatric/Behavioral: Negative.  Negative for depression. The patient is not nervous/anxious and does not have insomnia.       Allergies  Allergen Reactions  . Codeine Itching, Nausea  And Vomiting and Other (See Comments)    PT STATES SHE FELT NAUSEATED AND DID NOT LIKE THE WAY IT MADE HER FEEL     Past Medical History:  Diagnosis Date  . Anemia    h/o   . Anxiety   . Chest pain    a. 05/2018 MV: EF 55-65%, small, mild, fixed apical ant and apical defect, most likely artifact. No ischemia-->Low risk.  . Complication of anesthesia    DURING SECTION, BP DROPPED /N/V  . COPD (chronic obstructive pulmonary disease) (HCC)   . Depression   . Dyspnea    DUE TO COPD  . Essential hypertension   . GERD (gastroesophageal reflux disease)    due to gallbladder  . History of echocardiogram    a. 02/2015 Echo: Ef 60-65%. no rwma.  . Hyperlipidemia   . PONV (postoperative nausea and vomiting)    X1 ONLY DURING C SECTION  . PSVT (paroxysmal supraventricular tachycardia) (HCC)   . Tobacco abuse      Past Surgical History:  Procedure Laterality Date  . CESAREAN SECTION     x2  . MOUTH SURGERY      Social History   Socioeconomic History  . Marital status: Single    Spouse name: Not on file  . Number of children: Not on file  . Years of education: Not on file  . Highest education level: Not on file  Occupational History  . Not on file  Tobacco Use  . Smoking status: Current Every Day Smoker    Packs/day: 1.50    Years: 30.00    Pack years: 45.00    Types: Cigarettes  . Smokeless tobacco: Never Used  Vaping Use  . Vaping Use: Never used  Substance and Sexual Activity  . Alcohol use: Not Currently  . Drug use: Not Currently    Comment: LAST USED WHILE IN HER 20'S  . Sexual activity: Not on file  Other Topics Concern  . Not on file  Social History Narrative  . Not on file   Social Determinants of Health   Financial Resource Strain: Not on file  Food Insecurity: Not on file  Transportation Needs: Not on file  Physical Activity: Not on file  Stress: Not on file  Social Connections: Not on file  Intimate Partner Violence: Not on file    Family  History  Family history unknown: Yes     Current Outpatient Medications:  .  albuterol (VENTOLIN HFA) 108 (90 Base) MCG/ACT inhaler, Inhale 2 puffs into the lungs every 6 (six) hours as needed for wheezing or shortness of breath., Disp: 1 each, Rfl: 2 .  buPROPion (WELLBUTRIN SR) 150 MG 12 hr tablet, TAKE 1 TABLET BY MOUTH TWICE A DAY, Disp: 180 tablet, Rfl: 0 .  ibuprofen (ADVIL) 800 MG tablet, Take 1 tablet (800 mg total) by mouth every 8 (eight) hours as needed., Disp: 30 tablet, Rfl: 0 .  metoprolol tartrate (LOPRESSOR) 25 MG tablet, TAKE 1 TABLET BY MOUTH TWICE A DAY, Disp: 180 tablet, Rfl: 3 .  nicotine (NICODERM CQ - DOSED IN MG/24 HOURS) 21 mg/24hr patch, Place 1 patch (21 mg total) onto the skin daily., Disp: 28  patch, Rfl: 0 .  nicotine polacrilex (COMMIT) 4 MG lozenge, Take 1 lozenge (4 mg total) by mouth as needed for smoking cessation., Disp: 100 tablet, Rfl: 0  Physical exam: There were no vitals filed for this visit. Limited secondary to telephone visit. Tanya Hardin was in no acute distress during our conversation.  CMP Latest Ref Rng & Units 12/20/2019  Glucose 70 - 99 mg/dL 962(I)  BUN 6 - 20 mg/dL 11  Creatinine 2.97 - 9.89 mg/dL 2.11  Sodium 941 - 740 mmol/L 135  Potassium 3.5 - 5.1 mmol/L 3.8  Chloride 98 - 111 mmol/L 102  CO2 22 - 32 mmol/L 23  Calcium 8.9 - 10.3 mg/dL 8.1(K)  Total Protein 6.5 - 8.1 g/dL 7.3  Total Bilirubin 0.3 - 1.2 mg/dL 1.1  Alkaline Phos 38 - 126 U/L 63  AST 15 - 41 U/L 29  ALT 0 - 44 U/L 38   CBC Latest Ref Rng & Units 12/20/2019  WBC 4.0 - 10.5 K/uL 14.8(H)  Hemoglobin 12.0 - 15.0 g/dL 11.7(L)  Hematocrit 36.0 - 46.0 % 33.8(L)  Platelets 150 - 400 K/uL 199    No images are attached to the encounter.  CT ABDOMEN PELVIS W CONTRAST  Result Date: 12/21/2019 CLINICAL DATA:  Generalized aching abdominal pain with low-grade fever EXAM: CT ABDOMEN AND PELVIS WITH CONTRAST TECHNIQUE: Multidetector CT imaging of the abdomen and pelvis was  performed using the standard protocol following bolus administration of intravenous contrast. CONTRAST:  OMNIPAQUE IOHEXOL 300 MG/ML  SOLN COMPARISON:  None. FINDINGS: Lower chest:  Mild dependent atelectasis Hepatobiliary: No focal liver abnormality.Cholecystectomy. No bile duct dilatation or gallbladder fossa collection. Pancreas: Unremarkable. Spleen: Unremarkable. Adrenals/Urinary Tract: Negative adrenals. No hydronephrosis or stone. Unremarkable bladder. Stomach/Bowel: There is a track like area of gas in fluid with adjacent fat stranding at the transverse mesocolon. No adjacent diverticula or clear colonic wall thickening. Negative for ileus. Vascular/Lymphatic: Atheromatous calcification which is notable for age. No mass or adenopathy. Reproductive:No pathologic findings. Other: Pneumoperitoneum and abdominal wall gas consistent with recent surgery. There is a hematoma in the left ventral abdomen which measures 13 cm craniocaudal and 2.2 cm in thickness. The hematoma is extraperitoneal in presumably from abdominal wall access. Musculoskeletal: No acute abnormalities. IMPRESSION: 1. Recent cholecystectomy with no gallbladder fossa fluid or bile duct dilatation. 2. Tract of gas with stranding at the transverse mesocolon, question if there was trocar placement in this area. There is pneumoperitoneum which is expected after recent surgery. 3. Extraperitoneal hematoma in the left ventral abdomen. Electronically Signed   By: Marnee Spring M.D.   On: 12/21/2019 05:17     Assessment and plan- Patient is a 54 y.o. femalewho presents to smoking cessation clinic to discuss strategies, medications and interventions needed to quit smoking.  Topics covered: Change in daily routines Dealing with urges to smoke Getting support Anticipating/avoiding triggers Secondhand smoke Behavioral skills Reinforced benefits  Reviewed pharmacotherapy: OTC medications include:  Nicotine replacement therapy (NRT)  gum  NRT lozenge  NRT patch  NRT patch plus combination of gum or lozenge  Medical treatment:  NRT nasal spray: Dosing 1-2 doses per hour (8-40 doses per day); 1 dose equals 1 spray in each nostril; each spray level 0.5 mg of nicotine  NRT oral inhaler: Dosing 6-16 cartridges per day; initially use 1 cartridge every 1-2 hours  Bupropion SR: Dosing: Begin 1 to 2 weeks prior to quit date; 150 mg by mouth every a.m. x3 days, then increase to 150  mg by mouth twice daily.  Contraindications: Head injury, seizures, eating disorders, MAOI inhibitor.  Verenicline: Dosing: Begin 1 week prior to quit date; days 1 through 3 0.5 mg by mouth every a.m.; days 4 through 7; 0.5 mg p.o. twice daily; weeks 2 through 12: 1 mg p.o. twice daily.  Monitor for neuropsychiatric symptoms.  Follow-up plan: We will discontinue Chantix and we can start nicotine patch.  I recommend 21 mg nicotine patches.  She is to apply 1 patch per day.  She will apply any 1 daily and remove the old 1.  She also can use OTC medications such as an RT lozenge or gum.  Given her financial situation, have called both prescriptions into [facility].  They should be free for her.  I also spoke with our social worker who recommends calling the Department of Social Services at [phone] and asking to speak to somebody about adult Medicaid.  I am not certain that this can be completed completely over the telephone but the paperwork can at least be mailed to you.  Disposition: Return to clinic in about 1 month to assess tolerance of nicotine patch and then RT lozenge.  Patient knows she can call clinic sooner if she has questions.  Visit Diagnosis 1. Personal history of tobacco use, presenting hazards to health   2. Smoking trying to quit     Patient expressed understanding and was in agreement with this plan. She also understands that She can call clinic at any time with any questions, concerns, or complaints.   Greater than 50%  was spent in counseling and coordination of care with this patient including but not limited to discussion of the relevant topics above (See A&P) including, but not limited to diagnosis and management of acute and chronic medical conditions.   Thank you for allowing me to participate in the care of this very pleasant patient.    Mauro KaufmannJennifer E Jaiyanna Safran, NP [facility] at [facility] Cell - [phone] Pager- [phone] 01/09/2020 1:05 PM

## 2020-02-07 ENCOUNTER — Inpatient Hospital Stay: Payer: Self-pay | Attending: Oncology | Admitting: Oncology

## 2020-02-07 DIAGNOSIS — F17210 Nicotine dependence, cigarettes, uncomplicated: Secondary | ICD-10-CM

## 2020-02-07 DIAGNOSIS — Z720 Tobacco use: Secondary | ICD-10-CM

## 2020-02-07 DIAGNOSIS — F331 Major depressive disorder, recurrent, moderate: Secondary | ICD-10-CM

## 2020-02-07 MED ORDER — BUPROPION HCL ER (SR) 150 MG PO TB12: 150.0000 mg | ORAL_TABLET | Freq: Two times a day (BID) | ORAL | 0 refills | Status: DC

## 2020-02-07 MED ORDER — NICOTINE 21 MG/24HR TD PT24: 21.0000 mg | MEDICATED_PATCH | Freq: Every day | TRANSDERMAL | 0 refills | Status: DC

## 2020-02-07 NOTE — Progress Notes (Signed)
Smoking Cessation Clinic  [facility]  Telephone:([phone] Fax:([phone]  Patient Care Team: Smitty Cords, DO as PCP - General (Family Medicine) Iran Ouch, MD as PCP - Cardiology (Cardiology)   Name of the patient: Tanya Hardin  [id number]  02/10/1965   Date of visit: 02/07/2020   Chief complaint/ Reason for visit- Smoking Cessation counseling  PMH-Tanya Hardin is a 55 year old female with past medical history significant for intermittent tachycardia currently managed with metoprolol, hypertension, COPD and tobacco dependence.  She was referred from our low-dose CT screening program.  She is scheduled to have her gallbladder removed at the end of November and the surgeon wishes for her to quit smoking.  Tanya Hardin unfortunately never picked up her Chantix.  She had her gallbladder removed on 12/18/2019.  She recovered well.   She started using nicotine patches in Dec 2021.  She was only able to afford 2 weeks worth.  She feels like they may have helped some.  She is currently unemployed and does not have transportation.  She lives with her daughter and takes care of her 2 grandchildren.  She is unable to afford medications at this time.   Interval history- Tanya Hardin is a 55 yo patient who presents for smoking cessation follow-up.  She reports feeling very stressed.  She was prescribed Prozac by PCP and has run out of medication.  She says her anxiety is "through the roof".  On med review she was prescribed Wellbutrin 150 mg twice daily by Dr. Althea Charon back in December 2021.  She states she never picked this up because she was unable to afford it.  She wants to stop smoking.  She does not have the desire to smoke any longer but she is unable to do it on her own.  Smoking history: Tobacco Use: High Risk  . Smoking Tobacco Use: Current Every Hardin Smoker  . Smokeless Tobacco Use: Never Used     She has smoked cigarettes  for 43 years.  Approximate date of last quit attempt: 1-2 year ago  Longest period of time quit in the past:9 months-long enough to have her daughter What caused the relapse: Stress   Medication used in the past:  Patches X 2 weeks (Jan 2022); unable to afford full month of patches   Readiness to quit: Patient states she is ready to quit.  Other smokers in household: yes-daughter   ECOG FS:1 - Symptomatic but completely ambulatory  Review of systems- Review of Systems  Constitutional: Negative.  Negative for chills, fever, malaise/fatigue and weight loss.  HENT: Negative for congestion, ear pain and tinnitus.   Eyes: Negative.  Negative for blurred vision and double vision.  Respiratory: Positive for shortness of breath. Negative for cough and sputum production.   Cardiovascular: Negative.  Negative for chest pain, palpitations and leg swelling.  Gastrointestinal: Negative.  Negative for abdominal pain, constipation, diarrhea, nausea and vomiting.  Genitourinary: Negative for dysuria, frequency and urgency.  Musculoskeletal: Negative for back pain and falls.  Skin: Negative.  Negative for rash.  Neurological: Negative.  Negative for weakness and headaches.  Endo/Heme/Allergies: Negative.  Does not bruise/bleed easily.  Psychiatric/Behavioral: Negative.  Negative for depression. The patient is not nervous/anxious and does not have insomnia.       Allergies  Allergen Reactions  . Codeine Itching, Nausea And Vomiting and Other (See Comments)    PT STATES SHE FELT NAUSEATED AND DID NOT LIKE THE WAY IT MADE HER  FEEL     Past Medical History:  Diagnosis Date  . Anemia    h/o   . Anxiety   . Chest pain    a. 05/2018 MV: EF 55-65%, small, mild, fixed apical ant and apical defect, most likely artifact. No ischemia-->Low risk.  . Complication of anesthesia    DURING SECTION, BP DROPPED /N/V  . COPD (chronic obstructive pulmonary disease) (HCC)   . Depression   . Dyspnea    DUE  TO COPD  . Essential hypertension   . GERD (gastroesophageal reflux disease)    due to gallbladder  . History of echocardiogram    a. 02/2015 Echo: Ef 60-65%. no rwma.  . Hyperlipidemia   . PONV (postoperative nausea and vomiting)    X1 ONLY DURING C SECTION  . PSVT (paroxysmal supraventricular tachycardia) (HCC)   . Tobacco abuse      Past Surgical History:  Procedure Laterality Date  . CESAREAN SECTION     x2  . MOUTH SURGERY      Social History   Socioeconomic History  . Marital status: Single    Spouse name: Not on file  . Number of children: Not on file  . Years of education: Not on file  . Highest education level: Not on file  Occupational History  . Not on file  Tobacco Use  . Smoking status: Current Every Hardin Smoker    Packs/Hardin: 1.50    Years: 30.00    Pack years: 45.00    Types: Cigarettes  . Smokeless tobacco: Never Used  Vaping Use  . Vaping Use: Never used  Substance and Sexual Activity  . Alcohol use: Not Currently  . Drug use: Not Currently    Comment: LAST USED WHILE IN HER 20'S  . Sexual activity: Not on file  Other Topics Concern  . Not on file  Social History Narrative  . Not on file   Social Determinants of Health   Financial Resource Strain: Not on file  Food Insecurity: Not on file  Transportation Needs: Not on file  Physical Activity: Not on file  Stress: Not on file  Social Connections: Not on file  Intimate Partner Violence: Not on file    Family History  Family history unknown: Yes     Current Outpatient Medications:  .  albuterol (VENTOLIN HFA) 108 (90 Base) MCG/ACT inhaler, Inhale 2 puffs into the lungs every 6 (six) hours as needed for wheezing or shortness of breath., Disp: 1 each, Rfl: 2 .  buPROPion (WELLBUTRIN SR) 150 MG 12 hr tablet, Take 1 tablet (150 mg total) by mouth 2 (two) times daily., Disp: 180 tablet, Rfl: 0 .  ibuprofen (ADVIL) 800 MG tablet, Take 1 tablet (800 mg total) by mouth every 8 (eight) hours as  needed., Disp: 30 tablet, Rfl: 0 .  metoprolol tartrate (LOPRESSOR) 25 MG tablet, TAKE 1 TABLET BY MOUTH TWICE A Hardin, Disp: 180 tablet, Rfl: 3 .  nicotine (NICODERM CQ - DOSED IN MG/24 HOURS) 21 mg/24hr patch, Place 1 patch (21 mg total) onto the skin daily., Disp: 28 patch, Rfl: 0 .  nicotine polacrilex (COMMIT) 4 MG lozenge, Take 1 lozenge (4 mg total) by mouth as needed for smoking cessation., Disp: 100 tablet, Rfl: 0  Physical exam: There were no vitals filed for this visit. Limited secondary to telephone visit. Tanya Hardin was in no acute distress during our conversation.  CMP Latest Ref Rng & Units 12/20/2019  Glucose 70 - 99 mg/dL 160(V)  BUN 6 - 20 mg/dL 11  Creatinine 7.67 - 3.41 mg/dL 9.37  Sodium 902 - 409 mmol/L 135  Potassium 3.5 - 5.1 mmol/L 3.8  Chloride 98 - 111 mmol/L 102  CO2 22 - 32 mmol/L 23  Calcium 8.9 - 10.3 mg/dL 7.3(Z)  Total Protein 6.5 - 8.1 g/dL 7.3  Total Bilirubin 0.3 - 1.2 mg/dL 1.1  Alkaline Phos 38 - 126 U/L 63  AST 15 - 41 U/L 29  ALT 0 - 44 U/L 38   CBC Latest Ref Rng & Units 12/20/2019  WBC 4.0 - 10.5 K/uL 14.8(H)  Hemoglobin 12.0 - 15.0 g/dL 11.7(L)  Hematocrit 36.0 - 46.0 % 33.8(L)  Platelets 150 - 400 K/uL 199    No images are attached to the encounter.  No results found.   Assessment and plan-   Plan:  Smoking cessation: -She continues to smoke about 1 pack/Hardin -She was given rx for Chantix prior to her gallbladder surgery which she never picked up. -She was given a prescription for 21 mg nicotine patches in December 2021 and picked them up in January 2022 and use them for 2 weeks. - Feels they were helpful. -We will send in refill of nicotine patch to [facility] and will cover the cost.  -She was previously prescribed Wellbutrin 150mg  twice daily by PCP.  She was unable to pick up due to cost.  We will resend this medication to [facility] and we will get it paid for as well. -Patient is very grateful.  She does not have  transportation so we will pick up the medications in the next couple of days.  Anxiety: -Previously on Prozac. -States anxiety is worsened since coming off of Prozac. -This is likely contributing to her stress which is hindering her smoking cessation.  -Stopped taking abruptly secondary to transportation and cost of medication. -She was prescribed Wellbutrin 150 mg twice daily by PCP back in December 2021. -I have resent this prescription to [facility] which will be paid for.   Tachycardia: - Controlled on metoprolol  Social issues: - Unemployed and has no transportation. -Has limited resources  Disposition: Prescription sent to [facility] under the [place] fund Return to clinic for virtual visit in 1 month to assess tolerance of Wellbutrin and nicotine patch combination. Patient knows she can call clinic sooner if she has questions.  Visit Diagnosis 1. Tobacco abuse   2. Major depressive disorder, recurrent, moderate (HCC)     Patient expressed understanding and was in agreement with this plan. She also understands that She can call clinic at any time with any questions, concerns, or complaints.   Greater than 50% was spent in counseling and coordination of care with this patient including but not limited to discussion of the relevant topics above (See A&P) including, but not limited to diagnosis and management of acute and chronic medical conditions.   Thank you for allowing me to participate in the care of this very pleasant patient.    Bay minette, NP [facility] at [facility] Cell - [phone] Pager- [phone] 02/07/2020 10:55 AM

## 2020-02-09 ENCOUNTER — Encounter: Payer: Self-pay | Admitting: Internal Medicine

## 2020-02-09 ENCOUNTER — Ambulatory Visit (INDEPENDENT_AMBULATORY_CARE_PROVIDER_SITE_OTHER): Payer: Self-pay | Admitting: Internal Medicine

## 2020-02-09 ENCOUNTER — Other Ambulatory Visit
Admission: RE | Admit: 2020-02-09 | Discharge: 2020-02-09 | Disposition: A | Discharge status: Home / Self Care | Payer: Self-pay | Attending: Internal Medicine | Admitting: Internal Medicine

## 2020-02-09 ENCOUNTER — Other Ambulatory Visit: Payer: Self-pay

## 2020-02-09 VITALS — BP 110/74 | HR 73 | Temp 97.8°F | Ht 62.0 in | Wt 187.0 lb

## 2020-02-09 DIAGNOSIS — R0602 Shortness of breath: Secondary | ICD-10-CM

## 2020-02-09 DIAGNOSIS — R0609 Other forms of dyspnea: Secondary | ICD-10-CM

## 2020-02-09 DIAGNOSIS — R0600 Dyspnea, unspecified: Secondary | ICD-10-CM

## 2020-02-09 DIAGNOSIS — D649 Anemia, unspecified: Secondary | ICD-10-CM | POA: Insufficient documentation

## 2020-02-09 DIAGNOSIS — Z87891 Personal history of nicotine dependence: Secondary | ICD-10-CM

## 2020-02-09 DIAGNOSIS — R0683 Snoring: Secondary | ICD-10-CM

## 2020-02-09 LAB — CBC WITH DIFFERENTIAL/PLATELET
Abs Immature Granulocytes: 0.04 10*3/uL (ref 0.00–0.07)
Basophils Absolute: 0.1 10*3/uL (ref 0.0–0.1)
Basophils Relative: 1 %
Eosinophils Absolute: 0.1 10*3/uL (ref 0.0–0.5)
Eosinophils Relative: 1 %
HCT: 39.2 % (ref 36.0–46.0)
Hemoglobin: 13.6 g/dL (ref 12.0–15.0)
Immature Granulocytes: 1 %
Lymphocytes Relative: 16 %
Lymphs Abs: 1.3 10*3/uL (ref 0.7–4.0)
MCH: 31 pg (ref 26.0–34.0)
MCHC: 34.7 g/dL (ref 30.0–36.0)
MCV: 89.3 fL (ref 80.0–100.0)
Monocytes Absolute: 0.7 10*3/uL (ref 0.1–1.0)
Monocytes Relative: 8 %
Neutro Abs: 5.8 10*3/uL (ref 1.7–7.7)
Neutrophils Relative %: 73 %
Platelets: 236 10*3/uL (ref 150–400)
RBC: 4.39 MIL/uL (ref 3.87–5.11)
RDW: 12.1 % (ref 11.5–15.5)
WBC: 8 10*3/uL (ref 4.0–10.5)
nRBC: 0 % (ref 0.0–0.2)

## 2020-02-09 NOTE — Progress Notes (Signed)
Hgb is now normal  Lab             02/09/20                      1208         HGB          13.6         HCT          39.2         WBC          8.0          PLT          236

## 2020-02-09 NOTE — Progress Notes (Signed)
OV 02/09/2020  Subjective:  Patient ID: Tanya Hardin, female , DOB: 1965/07/07 , age 55 y.o. , MRN: [id number] , ADDRESS: [address] [place] [address] PCP Smitty Cords, DO Patient Care Team: Smitty Cords, DO as PCP - General (Family Medicine) Iran Ouch, MD as PCP - Cardiology (Cardiology)  This Provider for this visit: Treatment Team:  Attending Provider: Salena Saner, MD    02/09/2020 -   Chief Complaint  Patient presents with  . pulmonary consult    Per Dr. Marica Otter of COPD. C/o sob with exertion.      [name] 55 y.o. -80 pack smoking history with current active smoking.  She used to work as a Conservator, museum/gallery.  Then approximately at the onset of the pandemic in March 2020 she quit working.  After that she gained from 180 pounds to 210 pounds.  Then end of 2020 when she had gallbladder surgery and after that she has had a healthy diet and she has dropped weight to 187 pounds.  She tells me that for the last few years she has had insidious onset of shortness of breath with exertion relieved by rest.  She is having difficulty doing stairs.  It is now progressive in the last 1 year.  Minimal exertion as is difficult.  Somebody told her that she has COPD/emphysema but apparently imaging recently showed ILD.  Therefore she is here to sort this out.  She also admits to significant amount of snoring as witnessed by friends or significant other.  There is also apneic spells.  [name]  Current symptom score is captured in the form of a CAT score and it is below.    CAT COPD Symptom & Quality of Life Score (GSK trademark) 0 is no burden. 5 is highest burden 02/09/2020   Never Cough -> Cough all the time 2  No phlegm in chest -> Chest is full of phlegm 3  No chest tightness -> Chest feels very tight 4  No dyspnea for 1 flight stairs/hill -> Very dyspneic for 1 flight of stairs 5  No limitations  for ADL at home -> Very limited with ADL at home 3  Confident leaving home -> Not at all confident leaving home 4  Sleep soundly -> Do not sleep soundly because of lung condition 3  Lots of Energy -> No energy at all 3  TOTAL Score (max 40)  27        Simple office walk 185 feet x  3 laps goal with forehead probe 02/09/2020   O2 used ra  Number laps completed 3  Comments about pace mode  Resting Pulse Ox/HR 96% and 87/min  Final Pulse Ox/HR 96% and 90/min  Desaturated </= 88% no  Desaturated <= 3% points no  Got Tachycardic >/= 90/min yes  Symptoms at end of test dyspneic  Miscellaneous comments Moderate to severe dyspnea   May 2020: Low risk nuclear medicine stress test  Chest x-ray November 2021: Personally visualized it looks normal to me but radiologist reported as interstitial prominence.  CT abdomen December 21, 2019: I personally visualized the lung images and there is basal atelectasis.   Pulm function test: Not available  CBC: Hemoglobin 9.7 g% on December 20, 2019.  Was 14.2 g% in October 2021  Chemistry panel: December 2021: Creatinine 0.75 mg percent.   Echocardiogram February 2017: Normal  has a past medical history  of Anemia, Anxiety, Chest pain, Complication of anesthesia, COPD (chronic obstructive pulmonary disease) (HCC), Depression, Dyspnea, Essential hypertension, GERD (gastroesophageal reflux disease), History of echocardiogram, Hyperlipidemia, PONV (postoperative nausea and vomiting), PSVT (paroxysmal supraventricular tachycardia) (HCC), and Tobacco abuse.   reports that she has been smoking cigarettes. She has a 80.00 pack-year smoking history. She has never used smokeless tobacco.  Past Surgical History:  Procedure Laterality Date  . CESAREAN SECTION     x2  . MOUTH SURGERY      Allergies  Allergen Reactions  . Codeine Itching, Nausea And Vomiting and Other (See Comments)    PT STATES SHE FELT NAUSEATED AND DID NOT LIKE THE WAY IT MADE HER FEEL     Immunization History  Administered Date(s) Administered  . Influenza-Unspecified 11/19/2013  . Pneumococcal Polysaccharide-23 06/30/2013  . Td 02/21/1991    Family History  Family history unknown: Yes     Current Outpatient Medications:  .  albuterol (VENTOLIN HFA) 108 (90 Base) MCG/ACT inhaler, Inhale 2 puffs into the lungs every 6 (six) hours as needed for wheezing or shortness of breath., Disp: 1 each, Rfl: 2 .  FLUoxetine (PROZAC) 10 MG tablet, Take 10 mg by mouth daily., Disp: , Rfl:  .  ibuprofen (ADVIL) 800 MG tablet, Take 1 tablet (800 mg total) by mouth every 8 (eight) hours as needed., Disp: 30 tablet, Rfl: 0 .  metoprolol tartrate (LOPRESSOR) 25 MG tablet, TAKE 1 TABLET BY MOUTH TWICE A DAY, Disp: 180 tablet, Rfl: 3 .  nicotine (NICODERM CQ - DOSED IN MG/24 HOURS) 21 mg/24hr patch, Place 1 patch (21 mg total) onto the skin daily., Disp: 28 patch, Rfl: 0 .  nicotine polacrilex (COMMIT) 4 MG lozenge, Take 1 lozenge (4 mg total) by mouth as needed for smoking cessation., Disp: 100 tablet, Rfl: 0 .  buPROPion (WELLBUTRIN SR) 150 MG 12 hr tablet, Take 1 tablet (150 mg total) by mouth 2 (two) times daily. (Patient not taking: Reported on 02/09/2020), Disp: 180 tablet, Rfl: 0      Objective:   Vitals:   02/09/20 1117  BP: 110/74  Pulse: 73  Temp: 97.8 F (36.6 C)  TempSrc: Temporal  SpO2: 98%  Weight: 187 lb (84.8 kg)  Height: 5\' 2"  (1.575 m)    Estimated body mass index is 34.2 kg/m as calculated from the following:   Height as of this encounter: 5\' 2"  (1.575 m).   Weight as of this encounter: 187 lb (84.8 kg).  @WEIGHTCHANGE @    02/09/20 1117  Weight: 187 lb (84.8 kg)     Physical Exam  General Appearance:    Alert, cooperative, no distress, appears stated age - older , Deconditioned looking - no , OBESE  - yesno, Sitting on Wheelchair -  no  Head:    Normocephalic, without obvious abnormality, atraumatic  Eyes:    PERRL,  conjunctiva/corneas clear,  Ears:    Normal TM's and external ear canals, both ears  Nose:   Nares normal, septum midline, mucosa normal, no drainage    or sinus tenderness. OXYGEN ON  - yes . Patient is @ ra   Throat:   Lips, mucosa, and tongue normal; teeth and gums normal. Cyanosis on lips - no  Neck:   Supple, symmetrical, trachea midline, no adenopathy;    thyroid:  no enlargement/tenderness/nodules; no carotid   bruit or JVD  Back:     Symmetric, no curvature, ROM normal, no CVA tenderness  Lungs:  Distress - no , Wheeze no, Barrell Chest - no, Purse lip breathing - no, Crackles - no   Chest Wall:    No tenderness or deformity.    Heart:    Regular rate and rhythm, S1 and S2 normal, no rub   or gallop, Murmur - no  Breast Exam:    NOT DONE  Abdomen:     Soft, non-tender, bowel sounds active all four quadrants,    no masses, no organomegaly. Visceral obesity - yes  Genitalia:   NOT DONE  Rectal:   NOT DONE  Extremities:   Extremities - normal, Has Cane - no, Clubbing - no, Edema - no  Pulses:   2+ and symmetric all extremities  Skin:   Stigmata of Connective Tissue Disease - no  Lymph nodes:   Cervical, supraclavicular, and axillary nodes normal  Psychiatric:  Neurologic:   Pleasant - yes, Anxious - no, Flat affect - no  CAm-ICU - neg, Alert and Oriented x 3 - yes, Moves all 4s - yes, Speech - normal, Cognition - intact        Assessment:       ICD-10-CM   1. Dyspnea on exertion  R06.00 Pulmonary Function Test [facility] Only    CT Chest High Resolution  2. History of smoking greater than 50 pack years  Z87.891   3. Anemia, unspecified type  D64.9 CBC w/Diff  4. Shortness of breath  R06.02   5. Loud snoring  R06.83 Split night study   She probably has sleep apnea and obesity related diastolic dysfunction.  She is a candidate for COPD or interstitial lung disease.  These need to be ruled out  She was recently anemic.  We need to make sure this is improved    Plan:      Patient Instructions     ICD-10-CM   1. Dyspnea on exertion  R06.00   2. History of smoking greater than 50 pack years  Z87.891   3. Anemia, unspecified type  D64.9   4. Shortness of breath  R06.02   5. Loud snoring  R06.83     Get HRCT supine and prone Get Full PFt Get CBC Get split night sleep study  Followup  - with aPP to revie results     SIGNATURE    Dr. Kalman Shan, M.D., F.C.C.P,  Pulmonary and Critical Care Medicine Staff Physician, [facility] Director - Interstitial Lung Disease  Program  Pulmonary Fibrosis [facility] at [facility] [place], [place], [address]  Pager: [phone], If no answer or between  15:00h - 7:00h: call [phone] Telephone: [phone]  11:52 AM 02/09/2020

## 2020-02-09 NOTE — Patient Instructions (Signed)
ICD-10-CM   1. Dyspnea on exertion  R06.00   2. History of smoking greater than 50 pack years  Z87.891   3. Anemia, unspecified type  D64.9   4. Shortness of breath  R06.02   5. Loud snoring  R06.83     Get HRCT supine and prone Get Full PFt Get CBC Get split night sleep study  Followup  - with aPP to revie results

## 2020-02-14 ENCOUNTER — Telehealth: Payer: Self-pay | Admitting: Oncology

## 2020-02-15 ENCOUNTER — Ambulatory Visit
Admission: RE | Admit: 2020-02-15 | Discharge: 2020-02-15 | Disposition: A | Discharge status: Home / Self Care | Payer: Self-pay | Source: Ambulatory Visit | Attending: Internal Medicine | Admitting: Internal Medicine

## 2020-02-15 ENCOUNTER — Other Ambulatory Visit: Payer: Self-pay

## 2020-02-15 DIAGNOSIS — R0600 Dyspnea, unspecified: Secondary | ICD-10-CM | POA: Insufficient documentation

## 2020-02-15 DIAGNOSIS — R0609 Other forms of dyspnea: Secondary | ICD-10-CM

## 2020-03-01 ENCOUNTER — Telehealth: Payer: Self-pay | Admitting: Pulmonary Disease

## 2020-03-01 NOTE — Telephone Encounter (Signed)
Patient is aware of date/time of covid test prior PFT.   

## 2020-03-04 ENCOUNTER — Other Ambulatory Visit: Payer: Self-pay

## 2020-03-04 ENCOUNTER — Other Ambulatory Visit
Admission: RE | Admit: 2020-03-04 | Discharge: 2020-03-04 | Disposition: A | Discharge status: Home / Self Care | Payer: HRSA Program | Source: Ambulatory Visit | Attending: Internal Medicine | Admitting: Internal Medicine

## 2020-03-04 DIAGNOSIS — Z20822 Contact with and (suspected) exposure to covid-19: Secondary | ICD-10-CM | POA: Diagnosis not present

## 2020-03-04 DIAGNOSIS — Z01812 Encounter for preprocedural laboratory examination: Secondary | ICD-10-CM | POA: Diagnosis present

## 2020-03-05 ENCOUNTER — Encounter: Payer: Self-pay | Admitting: Unknown Physician Specialty

## 2020-03-05 ENCOUNTER — Ambulatory Visit: Payer: Self-pay | Attending: Internal Medicine

## 2020-03-05 ENCOUNTER — Telehealth (INDEPENDENT_AMBULATORY_CARE_PROVIDER_SITE_OTHER): Payer: Self-pay | Admitting: Unknown Physician Specialty

## 2020-03-05 DIAGNOSIS — R0609 Other forms of dyspnea: Secondary | ICD-10-CM

## 2020-03-05 DIAGNOSIS — K047 Periapical abscess without sinus: Secondary | ICD-10-CM

## 2020-03-05 DIAGNOSIS — R0600 Dyspnea, unspecified: Secondary | ICD-10-CM | POA: Insufficient documentation

## 2020-03-05 LAB — PULMONARY FUNCTION TEST ARMC ONLY
DL/VA % pred: 80 %
DL/VA: 3.47 ml/min/mmHg/L
DLCO unc % pred: 71 %
DLCO unc: 13.71 ml/min/mmHg
FEF 25-75 Post: 2.17 L/sec
FEF 25-75 Pre: 2.17 L/sec
FEF2575-%Change-Post: 0 %
FEF2575-%Pred-Post: 88 %
FEF2575-%Pred-Pre: 88 %
FEV1-%Change-Post: 0 %
FEV1-%Pred-Post: 89 %
FEV1-%Pred-Pre: 88 %
FEV1-Post: 2.22 L
FEV1-Pre: 2.21 L
FEV1FVC-%Change-Post: 3 %
FEV1FVC-%Pred-Pre: 98 %
FEV6-%Change-Post: -2 %
FEV6-%Pred-Post: 89 %
FEV6-%Pred-Pre: 91 %
FEV6-Post: 2.77 L
FEV6-Pre: 2.84 L
FEV6FVC-%Pred-Post: 103 %
FEV6FVC-%Pred-Pre: 103 %
FVC-%Change-Post: -2 %
FVC-%Pred-Post: 86 %
FVC-%Pred-Pre: 88 %
FVC-Post: 2.77 L
Post FEV1/FVC ratio: 80 %
Post FEV6/FVC ratio: 100 %
Pre FEV1/FVC ratio: 78 %
Pre FEV6/FVC Ratio: 100 %
RV % pred: 82 %
RV: 1.47 L
TLC % pred: 89 %
TLC: 4.27 L

## 2020-03-05 LAB — SARS CORONAVIRUS 2 (TAT 6-24 HRS): SARS Coronavirus 2: NEGATIVE

## 2020-03-05 MED ORDER — AMOXICILLIN-POT CLAVULANATE 875-125 MG PO TABS: 1.0000 | ORAL_TABLET | Freq: Two times a day (BID) | ORAL | 0 refills | Status: DC

## 2020-03-05 MED ORDER — TRAMADOL HCL 50 MG PO TABS: 50.0000 mg | ORAL_TABLET | Freq: Three times a day (TID) | ORAL | 0 refills | Status: AC | PRN

## 2020-03-05 MED ORDER — ALBUTEROL SULFATE (2.5 MG/3ML) 0.083% IN NEBU
2.5000 mg | INHALATION_SOLUTION | Freq: Once | RESPIRATORY_TRACT | Status: AC
  Administered 2020-03-05: 2.5 mg via RESPIRATORY_TRACT
  Filled 2020-03-05: qty 3

## 2020-03-05 NOTE — Progress Notes (Signed)
LMP 06/27/2014    Subjective:    Patient ID: Tanya Hardin, female    DOB: 03-Nov-1965, 55 y.o.   MRN: [id number]  HPI: Tanya Hardin is a 55 y.o. female  Chief Complaint  Patient presents with  . Dental Pain    Possible abscess on the bottom on the right side, swelling in the lower jaw, headache and pain x 2 days. She state she doesn't have any insurance and cannot afford a dental appointment. The pt requesting an antibiotic to be called  in.    Due to the catastrophic nature of the COVID-19 pandemic, this visit was completed via audio and visual contact via Caregility due to the restrictions of the COVID-19 pandemic. All issues as above were discussed and addressed. Physical exam was done as above through visual confirmation on Caregility. If it was felt that the patient should be evaluated in the office, they were directed there. The patient verbally consented to this visit."} . Location of the patient: home . Location of the provider: work . Those involved with this call:  . Provider: Gabriel Cirri, DNP . CMA: Scot Jun . Front Desk/Registration: Fleet Contras  . Time spent on call: 10 minutes with patient face to face via video conference. More than 50% of this time was spent in counseling and coordination of care. 3 minutes total spent in review of patient's record and preparation of their chart.  I verified patient identity using two factors (patient name and date of birth). Patient consents verbally to being seen via telemedicine visit today.   Pt is on video for trouble with an abscessed tooth right lower side.  States it needs to be pulled but lacks money.  She will have enough in 2 weeks.  States she has had pain and swelling for 2 days.  Denies fever, nausea, or vomiting  Relevant past medical, surgical, family and social history reviewed and updated as indicated. Interim medical history since our last visit reviewed. Allergies and medications reviewed and  updated.  Review of Systems  Per HPI unless specifically indicated above     Objective:    LMP 06/27/2014   Wt Readings from Last 3 Encounters:  02/09/20 187 lb (84.8 kg)  01/04/20 185 lb 9.6 oz (84.2 kg)  12/20/19 182 lb 15.7 oz (83 kg)    Physical Exam Constitutional:      General: She is not in acute distress.    Appearance: Normal appearance. She is well-developed and well-nourished.  HENT:     Head: Normocephalic and atraumatic.  Eyes:     General: Lids are normal. No scleral icterus.       Right eye: No discharge.        Left eye: No discharge.     Conjunctiva/sclera: Conjunctivae normal.  Cardiovascular:     Rate and Rhythm: Normal rate.  Pulmonary:     Effort: Pulmonary effort is normal.  Abdominal:     Palpations: There is no hepatomegaly or splenomegaly.  Musculoskeletal:        General: Normal range of motion.  Skin:    General: Skin is intact.     Coloration: Skin is not pale.     Findings: No rash.  Neurological:     Mental Status: She is alert and oriented to person, place, and time.  Psychiatric:        Mood and Affect: Mood and affect normal.        Behavior: Behavior normal.  Thought Content: Thought content normal.        Judgment: Judgment normal.     Results for orders placed or performed during the hospital encounter of 03/04/20  SARS CORONAVIRUS 2 (TAT 6-24 HRS) Nasopharyngeal Nasopharyngeal Swab   Specimen: Nasopharyngeal Swab  Result Value Ref Range   SARS Coronavirus 2 NEGATIVE NEGATIVE      Assessment & Plan:   Problem List Items Addressed This Visit   None   Visit Diagnoses    Abscessed tooth    -  Primary   Will rx Augmentin 876 mg BID.  Rx for Tramadol for pain # 10.  Salt water gargles         Follow up plan: No follow-ups on file.

## 2020-03-17 ENCOUNTER — Telehealth: Payer: Self-pay | Admitting: Internal Medicine

## 2020-03-17 NOTE — Telephone Encounter (Signed)
PFT wil slight low dlco  CT chest with some nodulatiryt to explain the low dlco and also related to smking  Plan  - followup woth app to review results and discuss quit smoking     reports that she has been smoking cigarettes. She has a 60.00 pack-year smoking history. She has never used smokeless tobacco.    PFT Results Latest Ref Rng & Units 03/05/2020  FVC-Predicted Pre % 88  FVC-Post L 2.77  FVC-Predicted Post % 86  Pre FEV1/FVC % % 78  Post FEV1/FCV % % 80  FEV1-Pre L 2.21  FEV1-Predicted Pre % 88  FEV1-Post L 2.22  DLCO uncorrected ml/min/mmHg 13.71  DLCO UNC% % 71  DLVA Predicted % 80  TLC L 4.27  TLC % Predicted % 89  RV % Predicted % 82

## 2020-03-19 ENCOUNTER — Telehealth: Payer: Self-pay | Admitting: Internal Medicine

## 2020-03-19 ENCOUNTER — Ambulatory Visit: Payer: HRSA Program | Attending: Pulmonary Disease

## 2020-03-19 NOTE — Telephone Encounter (Signed)
Pt is scheduled for follow up with [facility] tomorrow 3/2. 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

## 2020-03-19 NOTE — Telephone Encounter (Signed)
Tanya Hardin from [facility] has spoke with Tanya Hardin this morning before she called [place] and stated that yes she wanted to do the sleep study tonight even though she would have to pay out of pocket do to not having insurance.  I have spoke with Tanya Hardin now and she has CXL the sleep study appt for tonight per the call from the patient to our office today.  She has gotten a new job and will try to reschedule later after insurance goes into effect

## 2020-03-20 ENCOUNTER — Telehealth: Payer: Self-pay | Admitting: Primary Care

## 2020-03-20 ENCOUNTER — Ambulatory Visit (INDEPENDENT_AMBULATORY_CARE_PROVIDER_SITE_OTHER): Payer: Self-pay | Admitting: Primary Care

## 2020-03-20 ENCOUNTER — Other Ambulatory Visit: Payer: Self-pay

## 2020-03-20 ENCOUNTER — Encounter: Payer: Self-pay | Admitting: Primary Care

## 2020-03-20 DIAGNOSIS — Z87891 Personal history of nicotine dependence: Secondary | ICD-10-CM

## 2020-03-20 MED ORDER — BREO ELLIPTA 200-25 MCG/INH IN AEPB: 1.0000 | INHALATION_SPRAY | Freq: Every day | RESPIRATORY_TRACT | 0 refills | Status: AC

## 2020-03-20 NOTE — Progress Notes (Signed)
Virtual Visit via Telephone Note  I connected with Tanya Hardin on 03/20/20 at  9:30 AM EST by telephone and verified that I am speaking with the correct person using two identifiers.  Location: Patient: Home Provider: Office   I discussed the limitations, risks, security and privacy concerns of performing an evaluation and management service by telephone and the availability of in person appointments. I also discussed with the patient that there may be a patient responsible charge related to this service. The patient expressed understanding and agreed to proceed.   History of Present Illness: 55 year old female, current everyday smoker (80-pack-year history).  Significant for emphysema, dyspnea, paroxysmal supraventricular tachycardia, generalized anxiety disorder, tobacco use.  Patient of Dr. Marchelle Hardin, seen for initial consult on 02/09/2020.  Previous [facility] pulmonary encounter: 02/09/20- Dr. Colette Hardin 55 y.o. -80 pack smoking history with current active smoking.  She used to work as a Conservator, museum/gallery.  Then approximately at the onset of the pandemic in March 2020 she quit working.  After that she gained from 180 pounds to 210 pounds.  Then end of 2020 when she had gallbladder surgery and after that she has had a healthy diet and she has dropped weight to 187 pounds.  She tells me that for the last few years she has had insidious onset of shortness of breath with exertion relieved by rest.  She is having difficulty doing stairs.  It is now progressive in the last 1 year.  Minimal exertion as is difficult.  Somebody told her that she has COPD/emphysema but apparently imaging recently showed ILD.  Therefore she is here to sort this out.  She also admits to significant amount of snoring as witnessed by friends or significant other.  There is also apneic spells.  [name]  Current symptom score is captured in the form of a CAT score and it is below.   CAT  COPD Symptom & Quality of Life Score (GSK trademark) 0 is no burden. 5 is highest burden 02/09/2020   Never Cough -> Cough all the time 2  No phlegm in chest -> Chest is full of phlegm 3  No chest tightness -> Chest feels very tight 4  No dyspnea for 1 flight stairs/hill -> Very dyspneic for 1 flight of stairs 5  No limitations for ADL at home -> Very limited with ADL at home 3  Confident leaving home -> Not at all confident leaving home 4  Sleep soundly -> Do not sleep soundly because of lung condition 3  Lots of Energy -> No energy at all 3  TOTAL Score (max 40)  27    Simple office walk 185 feet x  3 laps goal with forehead probe 02/09/2020   O2 used ra  Number laps completed 3  Comments about pace mode  Resting Pulse Ox/HR 96% and 87/min  Final Pulse Ox/HR 96% and 90/min  Desaturated </= 88% no  Desaturated <= 3% points no  Got Tachycardic >/= 90/min yes  Symptoms at end of test dyspneic  Miscellaneous comments Moderate to severe dyspnea   May 2020: Low risk nuclear medicine stress test  Chest x-ray November 2021: Personally visualized it looks normal to me but radiologist reported as interstitial prominence.  03/20/2020- interim hx  Patient contacted today for telephone visit to review recent testing results.  Pulmonary function showed mild diffusion defect and CT chest showed some nodularity reflecting smoking. She canceled sleep study due to being  self-pay.  She gets winded at different times. She is fairly sedentary and does not exercise because of this. She feels albuterol has not been helping her breathing and would like to try a different inhaler. She is committed to quitting smoking, she plans to start using NRT with nicotine patches.  Her weight has fluctuated 10-20lbs. She is very anxious at baseline d/t life stressors and money. She is starting a new job today.  She is on metoprolol 25mg  BID for tachycardia. She follows with cardiology.  Patient is self pay    Observations/Objective:  - Able to speak in full sentences; no overt shortness of breath or wheezing  Pulmonary function testing 03/05/2020-FVC 2.77 (86%), FEV1 2.22 (89%), ratio 80, TLC 89%, DLCOunc 19.26 (71%) Normal spirometry.  No bronchodilator response, mild diffusion defect  CT chest on 02/16/2020 showed no evidence of fibrotic interstitial lung disease, mild diffuse bronchial wall thickening, very fine centrilobular nodularity most commonly seen in smoking-related respiratory bronchiolitis.  Labs- CBC normal   Assessment and Plan:  Chronic bronchitis/ Dyspnea on exertion: - Current smoker. PFTs showed no evidence of obstruction, mild diffusion defect  - CT chest showed some nodularity reflecting smoking, no ILD  - We will given her a therapeutic trial ICS/LABA (BREO 200 sample) to see if this helps with DOE and cough  - Strongly encourage smoking cessation by tapering amount, using nicotine replacement therapy and picking a quit date - Recommend increasing activity level by getting 20 mins a day of physical exercise   Snoring: - Patient canceled sleep study due to being self-pay.  Paroxysmal supraventricular tachycardia: - Following with Cardiology - Metoprolol 25mg  BID   Tobacco use: - 80 pack year hx - Refer to lung cancer screening program, due feb 2023  Follow Up Instructions:   - 1 month OV or televisit with Beth NP  I discussed the assessment and treatment plan with the patient. The patient was provided an opportunity to ask questions and all were answered. The patient agreed with the plan and demonstrated an understanding of the instructions.   The patient was advised to call back or seek an in-person evaluation if the symptoms worsen or if the condition fails to improve as anticipated.  I provided 22 minutes of non-face-to-face time during this encounter.   , NP

## 2020-03-20 NOTE — Telephone Encounter (Signed)
Can you please provide patient with 2 samples of Breo 200 and set up 1 month follow-up in office or televisit with me

## 2020-03-20 NOTE — Telephone Encounter (Signed)
2 samples of Breo 200 has been placed up front for pickup.  Phone visit scheduled for 04/23/2020 at 2:00. Patient prefers phone visit due to transportation issues.  She will pickup samples today.  Nothing further needed at this time.

## 2020-03-20 NOTE — Patient Instructions (Addendum)
Pulmonary function showed mild diffusion defect and CT chest showed some nodularity reflecting smoking  Trial daily inhaler with steriod/long acting bronchodilator to see if this helps with shortness of breath with activity and cough   Recommendations: - Start Breo 200- take one puff daily in the morning (rinse mouth after use) - Strongly encourage you quit smoking all together- taper amount, use nicotine replacement therapy and pick quit date - Starting increasing your activity level- work on getting 20 mins a day (walking, hiking, jogging, biking, aerobics, swimming, tennis/or other sports) - Maintain healthy weight, aim for BMI <28  Follow-up - 1 month office or televisit with Waynetta Sandy NP

## 2020-03-28 ENCOUNTER — Ambulatory Visit: Payer: Self-pay | Admitting: Family Medicine

## 2020-03-29 ENCOUNTER — Encounter: Payer: Self-pay | Admitting: Family Medicine

## 2020-03-29 ENCOUNTER — Other Ambulatory Visit: Payer: Self-pay

## 2020-03-29 ENCOUNTER — Ambulatory Visit (INDEPENDENT_AMBULATORY_CARE_PROVIDER_SITE_OTHER): Payer: Self-pay | Admitting: Family Medicine

## 2020-03-29 VITALS — BP 111/51 | HR 74 | Ht 62.0 in | Wt 190.0 lb

## 2020-03-29 DIAGNOSIS — G8929 Other chronic pain: Secondary | ICD-10-CM | POA: Insufficient documentation

## 2020-03-29 DIAGNOSIS — J432 Centrilobular emphysema: Secondary | ICD-10-CM

## 2020-03-29 DIAGNOSIS — M47816 Spondylosis without myelopathy or radiculopathy, lumbar region: Secondary | ICD-10-CM | POA: Insufficient documentation

## 2020-03-29 DIAGNOSIS — M545 Low back pain, unspecified: Secondary | ICD-10-CM

## 2020-03-29 DIAGNOSIS — R7309 Other abnormal glucose: Secondary | ICD-10-CM

## 2020-03-29 MED ORDER — NAPROXEN 500 MG PO TABS: 500.0000 mg | ORAL_TABLET | Freq: Two times a day (BID) | ORAL | 2 refills | Status: DC

## 2020-03-29 MED ORDER — GABAPENTIN 100 MG PO CAPS: ORAL_CAPSULE | ORAL | 1 refills | Status: DC

## 2020-03-29 NOTE — Progress Notes (Signed)
Subjective:    Patient ID: Tanya Hardin, female    DOB: Jun 16, 1965, 55 y.o.   MRN: [id number]  Tanya Hardin is a 55 y.o. female presenting on 03/29/2020 for Back Pain   HPI   Low Back Chronic Pain Chronic prior issue with diagnosis Osteoarthritis Lumbar Spine DJD She has difficulty working due to chronic back pain Worse back pain flare, no longer has her job due to pain cannot work. She has difficulty managing her back pain, she cannot put on bra and has limited mobility Difficulty standing up straight. Prior evaluation ED 06/2019, X-rays see below.  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 Pain radiates into hips and lower extremity Pain is moderate to severe at worst, has some constant or chronic aching soreness pain can flare up, some radiation of pain.  She is concerned with this pain causing some of her disability limiting her function. She requests further treatment options. She is self pay asking about referrals.  No prior back surgery  - Denies any fevers/chills, numbness, tingling, weakness, loss of control bladder/bowel incontinence or retention, unintentional wt loss, night sweats   Depression screen PHQ 2/9 12/08/2019  Decreased Interest 1  Down, Depressed, Hopeless 1  PHQ - 2 Score 2  Altered sleeping 3  Tired, decreased energy 3  Change in appetite 1  Feeling bad or failure about yourself  3  Trouble concentrating 3  Moving slowly or fidgety/restless 1  Suicidal thoughts 0  PHQ-9 Score 16  Difficult doing work/chores Very difficult    Social History   Tobacco Use  . Smoking status: Current Every Day Smoker    Packs/day: 1.50    Years: 40.00    Pack years: 60.00    Types: Cigarettes  . Smokeless tobacco: Never Used  . Tobacco comment: 1.5PPD 02/09/2020  Vaping Use  . Vaping Use: Never used  Substance Use Topics  . Alcohol use: Not Currently  . Drug use: Not Currently    Comment: LAST  USED WHILE IN HER 20'S    Review of Systems Per HPI unless specifically indicated above     Objective:    BP (!) 111/51   Pulse 74   Ht 5\' 2"  (1.575 m)   Wt 190 lb (86.2 kg)   LMP 06/27/2014   SpO2 99%   BMI 34.75 kg/m   Wt Readings from Last 3 Encounters:  03/29/20 190 lb (86.2 kg)  02/09/20 187 lb (84.8 kg)  01/04/20 185 lb 9.6 oz (84.2 kg)    Physical Exam Vitals and nursing note reviewed.  Constitutional:      General: She is not in acute distress.    Appearance: She is well-developed. She is obese. She is not diaphoretic.     Comments: Well-appearing, comfortable, cooperative  HENT:     Head: Normocephalic and atraumatic.  Eyes:     General:        Right eye: No discharge.        Left eye: No discharge.     Conjunctiva/sclera: Conjunctivae normal.  Neck:     Thyroid: No thyromegaly.  Cardiovascular:     Rate and Rhythm: Normal rate and regular rhythm.     Heart sounds: Normal heart sounds. No murmur heard.   Pulmonary:     Effort: Pulmonary effort is normal. No respiratory distress.     Breath sounds: Normal breath sounds. No wheezing or rales.  Musculoskeletal:  General: Normal range of motion.     Cervical back: Normal range of motion and neck supple.     Comments: Low Back Inspection: Normal appearance, Large body habitus, no spinal deformity, symmetrical. Palpation: No tenderness over spinous processes. Bilateral lumbar paraspinal muscles non-tender but with some spasm. ROM: Reduced active ROM forward flex / back extension Special Testing: Seated SLR negative for radicular pain bilaterally Strength: Bilateral hip flex/ext 5/5, knee flex/ext 5/5, ankle dorsiflex/plantarflex 5/5 Neurovascular: intact distal sensation to light touch   Lymphadenopathy:     Cervical: No cervical adenopathy.  Skin:    General: Skin is warm and dry.     Findings: No erythema or rash.  Neurological:     Mental Status: She is alert and oriented to person, place, and  time.  Psychiatric:        Behavior: Behavior normal.     Comments: Well groomed, good eye contact, normal speech and thoughts      CLINICAL DATA:  Acute nontraumatic back pain. Pain for 2 years with sudden exacerbation over the last 2 days.  EXAM: LUMBAR SPINE - 2-3 VIEW  COMPARISON:  None.  FINDINGS: Five non rib-bearing lumbar type vertebral bodies are present. Vertebral body heights are maintained. No significant listhesis is present. Mild rightward curvature is centered at L3. Disc spaces are relatively preserved. There is some narrowing on the left at L3-4 and L4-5. There may be narrowing on the right at L5-S1. Atherosclerotic calcifications are present in the aorta. No aneurysm is evident.  IMPRESSION: 1. Mild rightward curvature centered at L3. 2. Mild degenerative changes in the lower lumbar spine as described. 3. No acute abnormality.   Electronically Signed   By: Marin Roberts M.D.   On: 06/30/2019 13:13  Results for orders placed or performed in visit on 03/05/20  Pulmonary Function Test [facility] Only  Result Value Ref Range   FVC-%Pred-Pre 88 %   FVC-Post 2.77 L   FVC-%Pred-Post 86 %   FVC-%Change-Post -2 %   FEV1-Pre 2.21 L   FEV1-%Pred-Pre 88 %   FEV1-Post 2.22 L   FEV1-%Pred-Post 89 %   FEV1-%Change-Post 0 %   FEV6-Pre 2.84 L   FEV6-%Pred-Pre 91 %   FEV6-Post 2.77 L   FEV6-%Pred-Post 89 %   FEV6-%Change-Post -2 %   Pre FEV1/FVC ratio 78 %   FEV1FVC-%Pred-Pre 98 %   Post FEV1/FVC ratio 80 %   FEV1FVC-%Change-Post 3 %   Pre FEV6/FVC Ratio 100 %   FEV6FVC-%Pred-Pre 103 %   Post FEV6/FVC ratio 100 %   FEV6FVC-%Pred-Post 103 %   FEF 25-75 Pre 2.17 L/sec   FEF2575-%Pred-Pre 88 %   FEF 25-75 Post 2.17 L/sec   FEF2575-%Pred-Post 88 %   FEF2575-%Change-Post 0 %   RV 1.47 L   RV % pred 82 %   TLC 4.27 L   TLC % pred 89 %   DLCO unc 13.71 ml/min/mmHg   DLCO unc % pred 71 %   DL/VA 3.29 ml/min/mmHg/L   DL/VA % pred 80 %       Assessment & Plan:   Problem List Items Addressed This Visit    Spondylosis of lumbar region without myelopathy or radiculopathy   Relevant Medications   gabapentin (NEURONTIN) 100 MG capsule   naproxen (NAPROSYN) 500 MG tablet   Other Relevant Orders   AMB Referral to Community Care Coordinaton   Chronic bilateral low back pain without sciatica - Primary   Relevant Medications   gabapentin (NEURONTIN) 100 MG  capsule   naproxen (NAPROSYN) 500 MG tablet   Other Relevant Orders   AMB Referral to Community Care Coordinaton   Centrilobular emphysema (HCC)   Relevant Orders   AMB Referral to [facility] Coordinaton    Other Visit Diagnoses    Abnormal glucose          Chronic Low Back Pain Osteoarthritis / DJD Lumbar Spine Subacute on chronic bilateral LBP with radiating pain and joint pain in lower ext without confirmed sciatica No acute injury Chronic problem for years, known DJD on X-rays prior last 06/2019 - Inadequate conservative therapy   Plan: 1. For Flare up ONLY - Start anti-inflammatory trial with rx Naprosyn 500mg  BID wc x 1-2 weeks, then PRN 2. Add Gabapentin titration dosage as discussed for maintenance dosing for chronic pain control 3. May use Tylenol PRN for breakthrough 4. Encouraged use of heating pad 1-2x daily for now then PRN 5.  Follow-up 4-6 weeks if not improved for re-evaluation, consider repeat X-ray imaging, trial of PT, and possibly referral to Orthopedic  Will refer to CCM Management now for further assistance to help link this self pay uninsured patient to resources, may warrant future charity care or other options    Orders Placed This Encounter  Procedures  . AMB Referral to [facility] Coordinaton    Referral Priority:   Routine    Referral Type:   Consultation    Referral Reason:   Care Coordination    Number of Visits Requested:   1     #POC A1c History of elevated glucose Screen for PreDM vs DM  Meds ordered this encounter   Medications  . gabapentin (NEURONTIN) 100 MG capsule    Sig: Start 1 capsule daily, increase by 1 cap every 2-3 days as tolerated up to 3 times a day, or may take 3 at once in evening.    Dispense:  90 capsule    Refill:  1  . naproxen (NAPROSYN) 500 MG tablet    Sig: Take 1 tablet (500 mg total) by mouth 2 (two) times daily with a meal. For 1-2 weeks then as needed for flare up    Dispense:  60 tablet    Refill:  2       Follow up plan: Return in about 3 months (around 06/29/2020) for 3 month follow-up back pain, elevated sugar ?A1c.   08/29/2020, DO [facility] 03/29/2020, 10:26 AM

## 2020-03-29 NOTE — Patient Instructions (Addendum)
Thank you for coming to the office today.  Start Gabapentin 100mg  capsules, take at night for 2-3 nights only, and then increase to 2 times a day for a few days, and then may increase to 3 times a day, it may make you drowsy, if helps significantly at night only, then you can increase instead to 3 capsules at night, instead of 3 times a day - In the future if needed, we can significantly increase the dose if tolerated well, some common doses are 300mg  three times a day up to 600mg  three times a day, usually it takes several weeks or months to get to higher doses  Recommend to start taking Tylenol Extra Strength 500mg  tabs - take 1 to 2 tabs per dose (max 1000mg ) every 6-8 hours for pain (take regularly, don't skip a dose for next 7 days), max 24 hour daily dose is 6 tablets or 3000mg . In the future you can repeat the same everyday Tylenol course for 1-2 weeks at a time.  - This is safe to take with anti-inflammatory medicines (Ibuprofen, Advil, Naproxen, Aleve, Meloxicam, Mobic)  WHEN YOU HAVE A FLARE UP -  Recommend trial of Anti-inflammatory with Naproxen (Naprosyn) 500mg  tabs - take one with food and plenty of water TWICE daily every day (breakfast and dinner), for next 2 to 4 weeks, then you may take only as needed - DO NOT TAKE any ibuprofen, aleve, motrin while you are taking this medicine - It is safe to take Tylenol Ext Str 500mg  tabs - take 1 to 2 (max dose 1000mg ) every 6 hours as needed for breakthrough pain, max 24 hour daily dose is 6 to 8 tablets or 4000mg   Stay tuned for [facility] - Chronic Care Management Team - help with financial and other options for care. And eventually referral to Orthopedic or back specialist.  A1c today, stay tuned for result.  Please schedule a Follow-up Appointment to: Return in about 3 months (around 06/29/2020) for 3 month follow-up back pain, elevated sugar ?A1c.  If you have any other questions or concerns, please feel free to call the office or send  a message through MyChart. You may also schedule an earlier appointment if necessary.  Additionally, you may be receiving a survey about your experience at our office within a few days to 1 week by e-mail or mail. We value your feedback.  , DO [facility], 

## 2020-04-01 LAB — POCT GLYCOSYLATED HEMOGLOBIN (HGB A1C): Hemoglobin A1C: 5.5 % (ref 4.0–5.6)

## 2020-04-01 NOTE — Addendum Note (Signed)
Addended by: Burnell Blanks on: 04/01/2020 08:25 AM   Modules accepted: Orders

## 2020-04-03 ENCOUNTER — Telehealth: Payer: Self-pay

## 2020-04-03 NOTE — Chronic Care Management (AMB) (Signed)
  Care Management   Note  04/03/2020 Name: SHARAY BELLISSIMO MRN: [id number] DOB: 12-Dec-1965  GUIDA ASMAN is a 55 y.o. year old female who is a primary care patient of Smitty Cords, DO. I reached out to [name] by phone today in response to a referral sent by Ms. Heywood Footman Tung's PCP Smitty Cords, DO.    Ms. Mckey was given information about care management services today including:  1. Care management services include personalized support from designated clinical staff supervised by her physician, including individualized plan of care and coordination with other care providers 2. 24/7 contact phone numbers for assistance for urgent and routine care needs. 3. The patient may stop care management services at any time by phone call to the office staff.  Patient agreed to services and verbal consent obtained.   Follow up plan: Telephone appointment with care management team member scheduled for: Licensed Clinical SW 04/09/2020 and RN CM 04/29/2020  Penne Lash, RMA Care Guide, Embedded Care Coordination [facility]  [place], [place] [address] Direct Dial: [phone] [email or web address] Website: [email or web address]

## 2020-04-04 ENCOUNTER — Telehealth: Payer: Self-pay

## 2020-04-04 NOTE — Telephone Encounter (Signed)
   Telephone encounter was:  Unsuccessful.  04/04/2020 Name: Tanya Hardin MRN: [id number] DOB: [birth year]  Unsuccessful outbound call made today to assist with:  Financial Difficulties related to medical expenses and Unable to leave message voicemail not set-up  Outreach Attempt:  1st Attempt   Akyia Borelli Pernell Dupre, AAS Paralegal, [facility] Care Guide . Embedded Care Coordination [facility]  Care Management  [address] [place], [place] [address] [email or web address]  ?? [phone]   [email or web address]

## 2020-04-09 ENCOUNTER — Ambulatory Visit: Payer: Self-pay | Admitting: Licensed Clinical Social Worker

## 2020-04-09 DIAGNOSIS — F411 Generalized anxiety disorder: Secondary | ICD-10-CM

## 2020-04-09 DIAGNOSIS — F331 Major depressive disorder, recurrent, moderate: Secondary | ICD-10-CM

## 2020-04-09 NOTE — Patient Instructions (Signed)
Licensed Clinical Social Worker Visit Information  Goals we discussed today:  Goals Addressed            This Visit's Progress   . Find Help in My Community       Timeframe:  Long-Range Goal Priority:  Medium Start Date:   04/09/20                     Expected End Date:  07/10/20                     Follow Up Date - 05/14/20   - begin a notebook of services in my neighborhood or community - call 211 when I need some help - follow-up on any referrals for help I am given - think ahead to make sure my need does not become an emergency - make a note about what I need to have by the phone or take with me, like an identification card or social security number have a back-up plan - have a back-up plan - make a list of family or friends that I can call    Why is this important?    Knowing how and where to find help for yourself or family in your neighborhood and community is an important skill.   You will want to take some steps to learn how.   CARE PLAN ENTRY (see longitudinal plan of care for additional care plan information)  Current Barriers:  . Patient with Depression in need of assistance with connection to community resources for financial assistance . Knowledge deficits and need for support, education and care coordination related to community resources support  . Financial constraints related to managing health care, Mental Health Concerns , and Social Isolation  Clinical Goal(s)  . Over the next 120 days, patient will work with C3 Guides and care management team member to address concerns related to financial strain   Interventions provided by LCSW:  . Assessed patient's care coordination needs related to financial strain and discussed ongoing care management follow up  . Patient reports that she applied for Medicaid but was denied coverage. Patient has barely any income but works part-time at [employer]. She is hoping that she can keep this job. Patient applied for disability  back in October of 2021. Patient DOES NOT want to go to a walk in clinic for medical assistance and remains agreeable to paying out of pocket to attend appointments at [facility].  . Provided patient with information about available financial and crisis support resources within her area.  . Advised patient to return C3 call as she missed her call on 04/04/20. Patient agreeable to return call today.  Nash Dimmer with appropriate clinical care team members regarding patient needs . Patient interviewed and appropriate assessments performed . Referred patient to community resources care guide team for assistance with financial relief.  . Discussed plans with patient for ongoing care management follow up and provided patient with direct contact information for care management team . Assisted patient/caregiver with obtaining information about health plan benefits . Provided education and assistance to client regarding Advanced Directives. . Provided education to patient/caregiver about Hospice and/or Palliative Care services . Encourage patient to consider a mental health provider for long term follow up and therapy/counseling as there are several providers in her area that take no insurance but she declined needing this resource.  . Solution-Focused Strategies, Brief CBT and Emotional/Supportive Counseling implemented during session on 04/09/20 . Care Guide referral  made by PCP  Patient Self Care Activities & Deficits:  . Patient is unable to independently navigate community resource options without care coordination support  . Acknowledges deficits and is motivated to resolve concern  . Patient is able to contact C3 Guide as discussed today . Lacks social connections . Calls provider office for new concerns or questions . Ability for insight . Independent living . Motivation for treatment . Strong family or social support  Initial goal documentation          Ms. Luthi was given information  about Chronic Care Management services today including:  1. CCM service includes personalized support from designated clinical staff supervised by her physician, including individualized plan of care and coordination with other care providers 2. 24/7 contact phone numbers for assistance for urgent and routine care needs. 3. Service will only be billed when office clinical staff spend 20 minutes or more in a month to coordinate care. 4. Only one practitioner may furnish and bill the service in a calendar month. 5. The patient may stop CCM services at any time (effective at the end of the month) by phone call to the office staff. 6. The patient will be responsible for cost sharing (co-pay) of up to 20% of the service fee (after annual deductible is met).  Patient agreed to services and verbal consent obtained.   Eula Fried, BSW, MSW, [email or web address] Phone: [phone]

## 2020-04-09 NOTE — Chronic Care Management (AMB) (Signed)
Care Management Clinical Social Work Note  04/09/2020 Name: Tanya Hardin MRN: [id number] DOB: December 15, 1965  Tanya Hardin is a 55 y.o. year old female who is a primary care patient of Smitty Cords, DO.  The Care Management team was consulted for assistance with chronic disease management and coordination needs.  Engaged with patient by telephone for initial visit in response to provider referral for social work chronic care management and care coordination services  Consent to Services:  Tanya Hardin was given information about Care Management services today including:  1. Care Management services includes personalized support from designated clinical staff supervised by her physician, including individualized plan of care and coordination with other care providers 2. 24/7 contact phone numbers for assistance for urgent and routine care needs. 3. The patient may stop case management services at any time by phone call to the office staff.  Patient agreed to services and consent obtained.   Assessment: Review of patient past medical history, allergies, medications, and health status, including review of relevant consultants reports was performed today as part of a comprehensive evaluation and provision of chronic care management and care coordination services.  SDOH (Social Determinants of Health) assessments and interventions performed:    Advanced Directives Status: See Care Plan for related entries.  Care Plan  Allergies  Allergen Reactions  . Codeine Itching, Nausea And Vomiting and Other (See Comments)    PT STATES SHE FELT NAUSEATED AND DID NOT LIKE THE WAY IT MADE HER FEEL    Outpatient Encounter Medications as of 04/09/2020  Medication Sig  . albuterol (VENTOLIN HFA) 108 (90 Base) MCG/ACT inhaler Inhale 2 puffs into the lungs every 6 (six) hours as needed for wheezing or shortness of breath.  Marland Kitchen FLUoxetine (PROZAC) 10 MG tablet Take 10 mg by mouth daily.  Marland Kitchen  gabapentin (NEURONTIN) 100 MG capsule Start 1 capsule daily, increase by 1 cap every 2-3 days as tolerated up to 3 times a day, or may take 3 at once in evening.  . metoprolol tartrate (LOPRESSOR) 25 MG tablet TAKE 1 TABLET BY MOUTH TWICE A DAY  . naproxen (NAPROSYN) 500 MG tablet Take 1 tablet (500 mg total) by mouth 2 (two) times daily with a meal. For 1-2 weeks then as needed for flare up   No facility-administered encounter medications on file as of 04/09/2020.    Patient Active Problem List   Diagnosis Date Noted  . Chronic bilateral low back pain without sciatica 03/29/2020  . Spondylosis of lumbar region without myelopathy or radiculopathy 03/29/2020  . GAD (generalized anxiety disorder) 12/08/2019  . Major depressive disorder, recurrent, moderate (HCC) 12/08/2019  . Xiphoid pain 12/08/2019  . Centrilobular emphysema (HCC) 12/08/2019  . Paroxysmal supraventricular tachycardia (HCC) 02/21/2015  . Tobacco use 02/21/2015    Conditions to be addressed/monitored: Depression; Financial constraints related to managing health care expenses   Care Plan : General Social Work (Adult)  Updates made by Gustavus Bryant, LCSW since 04/09/2020 12:00 AM    Problem: CHL AMB "PATIENT-SPECIFIC PROBLEM"     Problem: Depression Identification (Depression)     Long-Range Goal: I need financial relief   Start Date: 04/09/2020  Priority: Medium  Note:   Timeframe:  Long-Range Goal Priority:  Medium Start Date:   04/09/20                     Expected End Date:  07/10/20  Follow Up Date - 05/14/20   - begin a notebook of services in my neighborhood or community - call 211 when I need some help - follow-up on any referrals for help I am given - think ahead to make sure my need does not become an emergency - make a note about what I need to have by the phone or take with me, like an identification card or social security number have a back-up plan - have a back-up plan - make a  list of family or friends that I can call    Why is this important?    Knowing how and where to find help for yourself or family in your neighborhood and community is an important skill.   You will want to take some steps to learn how.   CARE PLAN ENTRY (see longitudinal plan of care for additional care plan information)  Current Barriers:  . Patient with Depression in need of assistance with connection to community resources for financial assistance . Knowledge deficits and need for support, education and care coordination related to community resources support  . Financial constraints related to managing health care, Mental Health Concerns , and Social Isolation  Clinical Goal(s)  . Over the next 120 days, patient will work with C3 Guides and care management team member to address concerns related to financial strain   Interventions provided by LCSW:  . Assessed patient's care coordination needs related to financial strain and discussed ongoing care management follow up  . Patient reports that she applied for Medicaid but was denied coverage. Patient has barely any income but works part-time at [employer]. She is hoping that she can keep this job. Patient applied for disability back in October of 2021. Patient DOES NOT want to go to a walk in clinic for medical assistance and remains agreeable to paying out of pocket to attend appointments at [facility].  . Provided patient with information about available financial and crisis support resources within her area.  . Advised patient to return C3 call as she missed her call on 04/04/20. Patient agreeable to return call today.  [name] with appropriate clinical care team members regarding patient needs . Patient interviewed and appropriate assessments performed . Referred patient to community resources care guide team for assistance with financial relief.  . Discussed plans with patient for ongoing care management follow up and provided patient  with direct contact information for care management team . Assisted patient/caregiver with obtaining information about health plan benefits . Provided education and assistance to client regarding Advanced Directives. . Provided education to patient/caregiver about Hospice and/or Palliative Care services . Encourage patient to consider a mental health provider for long term follow up and therapy/counseling as there are several providers in her area that take no insurance but she declined needing this resource.  . Solution-Focused Strategies, Brief CBT and Emotional/Supportive Counseling implemented during session on 04/09/20 . Care Guide referral made by PCP  Patient Self Care Activities & Deficits:  . Patient is unable to independently navigate community resource options without care coordination support  . Acknowledges deficits and is motivated to resolve concern  . Patient is able to contact C3 Guide as discussed today . Lacks social connections . Calls provider office for new concerns or questions . Ability for insight . Independent living . Motivation for treatment . Strong family or social support  Initial goal documentation     Task: Identify Depressive Symptoms and Facilitate Treatment  Follow Up Plan: SW will follow up with patient by phone over the next quarter   Dickie La, [place], MSW, LCSW [facility]  [facility] [email or web address] Phone: [phone]

## 2020-04-12 ENCOUNTER — Telehealth: Payer: Self-pay

## 2020-04-12 NOTE — Telephone Encounter (Signed)
   Telephone encounter was:  Successful.  04/12/2020 Name: Tanya Hardin MRN: [id number] DOB: 1965-05-13  Tanya Hardin is a 55 y.o. year old female who is a primary care patient of Smitty Cords, DO . The community resource team was consulted for assistance with Financial assistance and disability information  Care guide performed the following interventions: Patient provided with information about care guide support team and interviewed to confirm resource needs Spoke with patient about [employer] and emailed information to [email or web address] also included information about filing for disability.            .  Follow Up Plan:  Care guide will follow up with patient by phone over the next 7 days  Tanya Hardin, AAS Paralegal, [facility] Care Guide . Embedded Care Coordination [facility] Health  Care Management  [address] [place], [place] [address] ??[email or web address]  ?? [phone]   [email or web address]

## 2020-04-15 ENCOUNTER — Telehealth: Payer: Self-pay

## 2020-04-15 NOTE — Telephone Encounter (Signed)
   Telephone encounter was:  Successful.  04/15/2020 Name: Tanya Hardin MRN: [id number] DOB: [birth year]  Tanya Hardin is a 55 y.o. year old female who is a primary care patient of Smitty Cords, DO . The community resource team was consulted for assistance with disability information and assistance with food, rent and utilities  Care guide performed the following interventions: Follow up call placed to the patient to discuss status of referral Patient received emailed [facility] Application and plans on completing it this week. Also included instructions and information for filing disability along with community resources to assist with food, utilities and rent.  She does not need further assistance at this time.                         .  Follow Up Plan:  No further follow up planned at this time. The patient has been provided with needed resources.  Kambryn Dapolito, AAS Paralegal, [facility] Care Guide . Embedded Care Coordination [facility] Health  Care Management  [address] [place], [place] [address] ??[email or web address]  ?? [phone]   [email or web address]

## 2020-04-17 NOTE — Progress Notes (Deleted)
Cardiology Office Note:    Date:  04/18/2020   ID:  Tanya Hardin, DOB 1965-05-09, MRN [id number]  PCP:  Smitty Cords, DO  [facility] Cardiologist:  Lorine Bears, MD  [facility] Electrophysiologist:  None   Referring MD: Saralyn Pilar *   Chief Complaint: 6 month follow-up  History of Present Illness:    Tanya Hardin is a 55 y.o. female with a hx of PSVT, chest pain with non-ischemic stress test 05/2018, tobacco abuse who presents for follow-up.   Echo in 2017 showed normal LV functino without regional wall motion abnormalities.   In May 2020 the patient had a stress test for dyspnea and underwent stress testing, which was low risk and nonischemic. There was a question of a small, mild, fixed apical anterior and apical defect which was felt to be most likely representative of artifact.   Patient was seen in October 2021 in the ER for palpitations. She was taking metoprolol for rate control. ECG was non-acute and trop was negative x 2. The ER wait was long so she left and was later seen in the office 09/21/2019. She descried GERD/epigastric pain. Reassurnace was given. smptoms improved with the addition of H2 blocker and PPI therapy. Recommended PCP follow-up. Recommended she take metoprolol 20mg  twice a day as prescribed.   Past Medical History:  Diagnosis Date  . Anemia    h/o   . Anxiety   . Chest pain    a. 05/2018 MV: EF 55-65%, small, mild, fixed apical ant and apical defect, most likely artifact. No ischemia-->Low risk.  . Complication of anesthesia    DURING SECTION, BP DROPPED /N/V  . COPD (chronic obstructive pulmonary disease) (HCC)   . Depression   . Dyspnea    DUE TO COPD  . Essential hypertension   . GERD (gastroesophageal reflux disease)    due to gallbladder  . History of echocardiogram    a. 02/2015 Echo: Ef 60-65%. no rwma.  . Hyperlipidemia   . PONV (postoperative nausea and vomiting)    X1 ONLY DURING C SECTION  . PSVT  (paroxysmal supraventricular tachycardia) (HCC)   . Tobacco abuse     Past Surgical History:  Procedure Laterality Date  . CESAREAN SECTION     x2  . MOUTH SURGERY      Current Medications: No outpatient medications have been marked as taking for the 04/18/20 encounter (Appointment) with 04/20/20, Aldeen Riga H, PA-C.     Allergies:   Codeine   Social History   Socioeconomic History  . Marital status: Single    Spouse name: Not on file  . Number of children: Not on file  . Years of education: Not on file  . Highest education level: Not on file  Occupational History  . Not on file  Tobacco Use  . Smoking status: Current Every Day Smoker    Packs/day: 1.50    Years: 40.00    Pack years: 60.00    Types: Cigarettes  . Smokeless tobacco: Never Used  . Tobacco comment: 1.5PPD 02/09/2020  Vaping Use  . Vaping Use: Never used  Substance and Sexual Activity  . Alcohol use: Not Currently  . Drug use: Not Currently    Comment: LAST USED WHILE IN HER 20'S  . Sexual activity: Not on file  Other Topics Concern  . Not on file  Social History Narrative  . Not on file   Social Determinants of Health   Financial Resource Strain: Not on file  Food Insecurity: Not on file  Transportation Needs: Not on file  Physical Activity: Not on file  Stress: Not on file  Social Connections: Not on file     Family History: The patient's ***Family history is unknown by patient.  ROS:   Please see the history of present illness.    *** All other systems reviewed and are negative.  EKGs/Labs/Other Studies Reviewed:    The following studies were reviewed today:  Echo 2017 Study Conclusions   - Left ventricle: The cavity size was normal. Wall thickness was  normal. Systolic function was normal. The estimated ejection  fraction was in the range of 60% to 65%. Wall motion was normal;  there were no regional wall motion abnormalities. Left  ventricular diastolic function parameters  were normal.   Impressions:   - Normal study.   05/2018 Study Result  Narrative & Impression   Low risk, probably normal pharmacologic myocardial perfusion stress test.  There is a small in size, mild in severity, fixed apical anterior and apical defect that most likely represents artifact but cannot rule out small area of non-transmural scar.  There is no significant ischemia.  The left ventricular ejection fraction is normal (55-65%) with normal wall motion.  Sensitivity and specificity of the study are degraded by significant extracardiac activity, motion artifact, and breast attenuation.       EKG:  EKG is *** ordered today.  The ekg ordered today demonstrates ***  Recent Labs: 12/20/2019: ALT 38; BUN 11; Creatinine, Ser 0.75; Potassium 3.8; Sodium 135 02/09/2020: Hemoglobin 13.6; Platelets 236  Recent Lipid Panel No results found for: CHOL, TRIG, HDL, CHOLHDL, VLDL, LDLCALC, LDLDIRECT   Physical Exam:    VS:  LMP 06/27/2014     Wt Readings from Last 3 Encounters:  03/29/20 190 lb (86.2 kg)  02/09/20 187 lb (84.8 kg)  01/04/20 185 lb 9.6 oz (84.2 kg)     GEN: *** Well nourished, well developed in no acute distress HEENT: Normal NECK: No JVD; No carotid bruits LYMPHATICS: No lymphadenopathy CARDIAC: ***RRR, no murmurs, rubs, gallops RESPIRATORY:  Clear to auscultation without rales, wheezing or rhonchi  ABDOMEN: Soft, non-tender, non-distended MUSCULOSKELETAL:  No edema; No deformity  SKIN: Warm and dry NEUROLOGIC:  Alert and oriented x 3 PSYCHIATRIC:  Normal affect   ASSESSMENT:    1. PSVT (paroxysmal supraventricular tachycardia) (HCC)   2. Essential hypertension   3. Tobacco use   4. Gastroesophageal reflux disease, unspecified whether esophagitis present    PLAN:    In order of problems listed above:  GERD/epigastric pain  PSVT  HTN  Tobacco abuse   Disposition: Follow up {follow up:15908} with ***   Shared Decision Making/Informed  Consent   {Are you ordering a CV Procedure (e.g. stress test, cath, DCCV, TEE, etc)?   Press F2        :[id number]}    Signed, Aayush Gelpi Ardelle Lesches  04/18/2020 7:27 AM    [facility]

## 2020-04-18 ENCOUNTER — Ambulatory Visit: Payer: Self-pay | Admitting: Medical

## 2020-04-19 ENCOUNTER — Encounter: Payer: Self-pay | Admitting: Medical

## 2020-04-23 ENCOUNTER — Ambulatory Visit: Payer: Self-pay | Admitting: Primary Care

## 2020-04-29 ENCOUNTER — Telehealth: Payer: Self-pay | Admitting: General Practice

## 2020-04-29 ENCOUNTER — Telehealth: Payer: Self-pay

## 2020-04-29 NOTE — Telephone Encounter (Signed)
  Chronic Care Management   Outreach Note  04/29/2020 Name: Tanya Hardin MRN: [id number] DOB: March 15, 1965  Referred by: Smitty Cords, DO Reason for referral : Appointment (RNCM: Initial outreach for Chronic Disease Management and Care Coordination Needs )   An unsuccessful telephone outreach was attempted today. The patient was referred to the case management team for assistance with care management and care coordination.   Follow Up Plan: The care management team will reach out to the patient again over the next 30 days.   Alto Denver RN, MSN, CCM Community Care Coordinator [place]  [facility] [place]: [phone]

## 2020-04-30 NOTE — Telephone Encounter (Signed)
Patient rescheduled for 05/13/2020.

## 2020-04-30 NOTE — Telephone Encounter (Signed)
Please reschedule initial outreach for RN CM at [facility]

## 2020-05-13 ENCOUNTER — Telehealth: Payer: Self-pay

## 2020-05-13 ENCOUNTER — Telehealth: Payer: Self-pay | Admitting: General Practice

## 2020-05-13 NOTE — Telephone Encounter (Signed)
  Chronic Care Management   Outreach Note  05/13/2020 Name: Tanya Hardin MRN: [id number] DOB: 1965/05/11  Referred by: Smitty Cords, DO Reason for referral : Appointment (RNCM: 2nd attempt at Initial outreach- Care Coordination )   A second unsuccessful telephone outreach was attempted today. The patient was referred to the case management team for assistance with care management and care coordination.   Follow Up Plan: The care management team will reach out to the patient again over the next 30 days.   Alto Denver RN, MSN, CCM Community Care Coordinator [place]  [facility] [place]: [phone]

## 2020-05-14 ENCOUNTER — Telehealth: Payer: Self-pay

## 2020-05-14 ENCOUNTER — Telehealth: Payer: Self-pay | Admitting: Licensed Clinical Social Worker

## 2020-05-14 NOTE — Chronic Care Management (AMB) (Signed)
  Care Management   Note  05/14/2020 Name: Tanya Hardin MRN: [id number] DOB: 08/05/1965  Tanya Hardin is a 55 y.o. year old female who is a primary care patient of Smitty Cords, DO and is actively engaged with the care management team. I reached out to Tanya Hardin by phone today to assist with re-scheduling an initial visit with the RN Case Manager  Follow up plan: Unsuccessful telephone outreach attempt [facility] care management team will reach out to the patient again over the next 7 days.  If patient returns call to provider office, please advise to call Embedded Care Management Care Guide Tanya Hardin  at [phone]  Tanya Hardin, RMA Care Guide, Embedded Care Coordination [facility]  [place], [place] [address] Direct Dial: [phone] [email or web address]

## 2020-05-14 NOTE — Telephone Encounter (Signed)
    Clinical Social Work  Care Management   Phone Outreach    05/14/2020 Name: TAHLIYAH ANAGNOS MRN: [id number] DOB: 03-12-1965  [name] is a 55 y.o. year old female who is a primary care patient of Smitty Cords, DO .   CCM LCSW reached out to patient today by phone to introduce self, assess needs and offer Care Management services and interventions.    Telephone outreach was unsuccessful Unable to leave a HIPPA compliant phone message due to voice mail not set up.  Plan:CCM LCSW will wait for return call. If no return call is received, Will reach out to patient again in the next 30 days .   Review of patient status, including review of consultants reports, relevant laboratory and other test results, and collaboration with appropriate care team members and the patient's provider was performed as part of comprehensive patient evaluation and provision of care management services.     Jenel Lucks, MSW, LCSW [facility] Care Management [place]  [facility] [email or web address] Phone [phone] 9:06 AM

## 2020-05-24 ENCOUNTER — Encounter: Payer: Self-pay | Admitting: Primary Care

## 2020-05-24 ENCOUNTER — Ambulatory Visit (INDEPENDENT_AMBULATORY_CARE_PROVIDER_SITE_OTHER): Payer: Self-pay | Admitting: Primary Care

## 2020-05-24 DIAGNOSIS — J42 Unspecified chronic bronchitis: Secondary | ICD-10-CM

## 2020-05-24 DIAGNOSIS — F17200 Nicotine dependence, unspecified, uncomplicated: Secondary | ICD-10-CM

## 2020-05-24 NOTE — Progress Notes (Signed)
Virtual Visit via Telephone Note  I connected with Tanya Hardin on 05/24/20 at  9:30 AM EDT by telephone and verified that I am speaking with the correct person using two identifiers.  Location: Patient: Home Provider: Office   I discussed the limitations, risks, security and privacy concerns of performing an evaluation and management service by telephone and the availability of in person appointments. I also discussed with the patient that there may be a patient responsible charge related to this service. The patient expressed understanding and agreed to proceed.   History of Present Illness:  55 year old female, current everyday smoker (80-pack-year history).  Significant for emphysema, dyspnea, paroxysmal supraventricular tachycardia, generalized anxiety disorder, tobacco use.  Patient of Dr. Marchelle Gearing, seen for initial consult on 02/09/2020.  Previous [facility] pulmonary encounter: 02/09/20- Dr. Colette Ribas 55 y.o. -80 pack smoking history with current active smoking.  She used to work as a Conservator, museum/gallery.  Then approximately at the onset of the pandemic in March 2020 she quit working.  After that she gained from 180 pounds to 210 pounds.  Then end of 2020 when she had gallbladder surgery and after that she has had a healthy diet and she has dropped weight to 187 pounds.  She tells me that for the last few years she has had insidious onset of shortness of breath with exertion relieved by rest.  She is having difficulty doing stairs.  It is now progressive in the last 1 year.  Minimal exertion as is difficult.  Somebody told her that she has COPD/emphysema but apparently imaging recently showed ILD.  Therefore she is here to sort this out.  She also admits to significant amount of snoring as witnessed by friends or significant other.  There is also apneic spells.  [name]  Current symptom score is captured in the form of a CAT score and it is below.   CAT  COPD Symptom & Quality of Life Score (GSK trademark) 0 is no burden. 5 is highest burden 02/09/2020   Never Cough -> Cough all the time 2  No phlegm in chest -> Chest is full of phlegm 3  No chest tightness -> Chest feels very tight 4  No dyspnea for 1 flight stairs/hill -> Very dyspneic for 1 flight of stairs 5  No limitations for ADL at home -> Very limited with ADL at home 3  Confident leaving home -> Not at all confident leaving home 4  Sleep soundly -> Do not sleep soundly because of lung condition 3  Lots of Energy -> No energy at all 3  TOTAL Score (max 40)  27    Simple office walk 185 feet x  3 laps goal with forehead probe 02/09/2020   O2 used ra  Number laps completed 3  Comments about pace mode  Resting Pulse Ox/HR 96% and 87/min  Final Pulse Ox/HR 96% and 90/min  Desaturated </= 88% no  Desaturated <= 3% points no  Got Tachycardic >/= 90/min yes  Symptoms at end of test dyspneic  Miscellaneous comments Moderate to severe dyspnea   May 2020: Low risk nuclear medicine stress test  Chest x-ray November 2021: Personally visualized it looks normal to me but radiologist reported as interstitial prominence.  03/20/2020- interim hx  Patient contacted today for telephone visit to review recent testing results.  Pulmonary function showed mild diffusion defect and CT chest showed some nodularity reflecting smoking. She canceled sleep study due to  being self-pay.  She gets winded at different times. She is fairly sedentary and does not exercise because of this. She feels albuterol has not been helping her breathing and would like to try a different inhaler. She is committed to quitting smoking, she plans to start using NRT with nicotine patches.  Her weight has fluctuated 10-20lbs. She is very anxious at baseline d/t life stressors and money. She is starting a new job today.  She is on metoprolol 25mg  BID for tachycardia. She follows with cardiology.  Patient is self pay     05/24/2020 Patient contacted today for follow-up televisit. She never started [facility] because she did not think she could use inhaler while she was still smoking. She assumed it would not help. She still has two samples at home. She just started a new job, she is unsure if she will have insurance. She applied for [place] patient assistance two weeks ago.    Observations/Objective:  - Able to speak in full sentences; no overt shortness of breath or wheezing   Pulmonary function testing 03/05/2020-FVC 2.77 (86%), FEV1 2.22 (89%), ratio 80, TLC 89%, DLCOunc 19.26 (71%) Normal spirometry.  No bronchodilator response. Mild diffusion defect which correct for lung volumes   CT chest on 02/16/2020 showed no evidence of fibrotic interstitial lung disease, mild diffuse bronchial wall thickening, very fine centrilobular nodularity most commonly seen in smoking-related respiratory bronchiolitis.  Labs- CBC normal   Assessment and Plan:  Chronic bronchitis/ dyspnea on exertion: - PFTs showed normal spirometry. No bronchodilator response. Mild diffusion defect which corrects for lung volumes - CT chest showed some nodularity reflecting smoking, no ILD - Recommend patient start trial BREO 200 one puff daily (rinse mouth after use)  Snoring: - Patient cancelled sleep study due to being self pay  Tobacco use: - Current smoked, referred to lung cancer screenig program- due Feb 2023  - Continue to encourage smoking cessation   Follow Up Instructions:  3 month follow-up, patient to notify office if she notices improvement in dyspnea/cough with BREO for prescription    I discussed the assessment and treatment plan with the patient. The patient was provided an opportunity to ask questions and all were answered. The patient agreed with the plan and demonstrated an understanding of the instructions.   The patient was advised to call back or seek an in-person evaluation if the symptoms worsen or if the  condition fails to improve as anticipated.  I provided 15 minutes of non-face-to-face time during this encounter.   Mar 2023, NP

## 2020-05-24 NOTE — Patient Instructions (Signed)
Start Breo one puff daily in am (notify us if you need prescription sent to pharmacy) Quit smoking Follow up in 3 months

## 2020-06-04 ENCOUNTER — Telehealth: Payer: Self-pay | Admitting: Licensed Clinical Social Worker

## 2020-06-04 NOTE — Telephone Encounter (Signed)
    Clinical Social Work  Care Management   Phone Outreach    06/04/2020 Name: FERN CANOVA MRN: [id number] DOB: 1965/12/25  [name] is a 55 y.o. year old female who is a primary care patient of Smitty Cords, DO .   CCM LCSW reached out to patient today by phone to introduce self, assess needs and offer Care Management services and interventions.    2nd unsuccessful telephone outreach attempt.  If unable to reach patient by phone on the 3rd attempt, will discontinue outreach calls but will be available at any time to provide services.   Plan:Will route chart to Care Guide to see if patient would like to reschedule phone appointment   Review of patient status, including review of consultants reports, relevant laboratory and other test results, and collaboration with appropriate care team members and the patient's provider was performed as part of comprehensive patient evaluation and provision of care management services.     Jenel Lucks, MSW, LCSW L[facility]ricia Horsfall [facility] Care Management [place]  [facility] [email or web address] Phone [phone] 4:14 PM

## 2020-06-06 NOTE — Telephone Encounter (Signed)
Patient has been rescheduled.

## 2020-06-12 ENCOUNTER — Telehealth: Payer: Self-pay

## 2020-06-13 ENCOUNTER — Telehealth: Payer: Self-pay | Admitting: Licensed Clinical Social Worker

## 2020-06-13 ENCOUNTER — Ambulatory Visit: Payer: Self-pay | Admitting: Licensed Clinical Social Worker

## 2020-06-13 NOTE — Telephone Encounter (Signed)
    Clinical Social Work  Care Management   Phone Outreach    06/13/2020 Name: Tanya Hardin MRN: [id number] DOB: [birth year]  [name] is a 55 y.o. year old female who is a primary care patient of Smitty Cords, DO .   CCM LCSW reached out to patient today by phone to introduce self, assess needs and offer Care Management services and interventions.    3rd unsuccessful telephone outreach was attempted today. The patient was referred to the case management team for assistance with care management and care coordination.   Plan:Will route chart to Care Guide to see if patient would like to reschedule phone appointment   Review of patient status, including review of consultants reports, relevant laboratory and other test results, and collaboration with appropriate care team members and the patient's provider was performed as part of comprehensive patient evaluation and provision of care management services.     Jenel Lucks, MSW, LCSW [facility] Care Management [place]  [facility] [email or web address] Phone [phone] 8:43 AM

## 2020-06-13 NOTE — Telephone Encounter (Signed)
I believe third attempt is it a new referral would have to be placed

## 2020-06-13 NOTE — Chronic Care Management (AMB) (Signed)
    Clinical Social Work  Care Management   Phone Outreach    06/13/2020 Name: Tanya Hardin MRN: [id number] DOB: October 08, 1965  Tanya Hardin is a 55 y.o. year old female who is a primary care patient of Smitty Cords, DO .   3rd unsuccessful telephone outreach was attempted on 06/13/20. The patient was referred to the case management team for assistance with care management and care coordination. The patient's primary care provider has been notified of our unsuccessful attempts to make or maintain contact with the patient. The care management team is pleased to engage with this patient at any time in the future should he/she be interested in assistance from the care management team.   Plan: CCM LCSW will disconnect from care team.   Review of patient status, including review of consultants reports, relevant laboratory and other test results, and collaboration with appropriate care team members and the patient's provider was performed as part of comprehensive patient evaluation and provision of care management services.    Jenel Lucks, MSW, LCSW [facility] Care Management [place]  [facility] [email or web address] Phone [phone] 1:17 PM

## 2020-07-03 ENCOUNTER — Telehealth: Payer: Self-pay

## 2020-07-08 ENCOUNTER — Telehealth: Payer: Self-pay | Admitting: General Practice

## 2020-07-08 ENCOUNTER — Telehealth: Payer: Self-pay

## 2020-07-08 NOTE — Telephone Encounter (Signed)
  Care Management   Follow Up Note   07/08/2020 Name: MCKYNLIE VANDERSLICE MRN: [id number] DOB: 01/17/1966   Referred by: Smitty Cords, DO Reason for referral : Care Coordination (RNCM: Initial outreach for Chronic Disease Management and Care Coordination Needs- 3rd attempt)   Third unsuccessful telephone outreach was attempted today. The patient was referred to the case management team for assistance with care management and care coordination. The patient's primary care provider has been notified of our unsuccessful attempts to make or maintain contact with the patient. The care management team is pleased to engage with this patient at any time in the future should he/she be interested in assistance from the care management team.   Follow Up Plan: We have been unable to make contact with the patient for follow up. The care management team is available to follow up with the patient after provider conversation with the patient regarding recommendation for care management engagement and subsequent re-referral to the care management team.   Alto Denver RN, MSN, CCM Community Care Coordinator [facility]  [facility] [place] Mobile: [phone]   [facility]

## 2020-08-13 ENCOUNTER — Other Ambulatory Visit: Payer: Self-pay | Admitting: Internal Medicine

## 2020-09-06 NOTE — Telephone Encounter (Signed)
error 

## 2020-10-28 ENCOUNTER — Encounter: Payer: Self-pay | Admitting: General Surgery

## 2021-01-14 ENCOUNTER — Ambulatory Visit: Payer: Self-pay

## 2021-01-14 NOTE — Telephone Encounter (Signed)
°  Chief Complaint: Lightheaded, "just don't feel well." Symptoms: Weaker Frequency: Started 2 days ago Pertinent Negatives: Patient denies any other symptoms Disposition: [] ED /[] Urgent Care (no appt availability in office) / [x] Appointment(In office/virtual)/ []  [facility]/ [] Home Care/ [] Refused Recommended Disposition  Additional Notes: Pt. Asking to be seen tomorrow if possible. Please advise pt.     Reason for Disposition  [1] MODERATE dizziness (e.g., interferes with normal activities) AND [2] has NOT been evaluated by physician for this  (Exception: dizziness caused by heat exposure, sudden standing, or poor fluid intake)  Answer Assessment - Initial Assessment Questions 1. DESCRIPTION: "Describe your dizziness."     Lightheaded 2. LIGHTHEADED: "Do you feel lightheaded?" (e.g., somewhat faint, woozy, weak upon standing)     Faint 3. VERTIGO: "Do you feel like either you or the room is spinning or tilting?" (i.e. vertigo)     No 4. SEVERITY: "How bad is it?"  "Do you feel like you are going to faint?" "Can you stand and walk?"   - MILD: Feels slightly dizzy, but walking normally.   - MODERATE: Feels unsteady when walking, but not falling; interferes with normal activities (e.g., school, work).   - SEVERE: Unable to walk without falling, or requires assistance to walk without falling; feels like passing out now.      Mild 5. ONSET:  "When did the dizziness begin?"      2 days ago 6. AGGRAVATING FACTORS: "Does anything make it worse?" (e.g., standing, change in head position)     None 7. HEART RATE: "Can you tell me your heart rate?" "How many beats in 15 seconds?"  (Note: not all patients can do this)       No 8. CAUSE: "What do you think is causing the dizziness?"     Unsure 9. RECURRENT SYMPTOM: "Have you had dizziness before?" If Yes, ask: "When was the last time?" "What happened that time?"     No 10. OTHER SYMPTOMS: "Do you have any other symptoms?" (e.g.,  fever, chest pain, vomiting, diarrhea, bleeding)       No 11. PREGNANCY: "Is there any chance you are pregnant?" "When was your last menstrual period?"       No  Protocols used: Dizziness - Lightheadedness-A-AH

## 2021-01-15 NOTE — Telephone Encounter (Signed)
Pt has an appt scheduled for 12/30.  KP

## 2021-01-17 ENCOUNTER — Ambulatory Visit: Payer: Self-pay | Admitting: Family Medicine

## 2021-01-24 ENCOUNTER — Telehealth: Payer: Self-pay | Admitting: Internal Medicine

## 2021-01-24 NOTE — Telephone Encounter (Signed)
°*  STAT* If patient is at the pharmacy, call can be transferred to refill team.   1. Which medications need to be refilled? (please list name of each medication and dose if known) metoprolol   2. Which pharmacy/location (including street and city if local pharmacy) is medication to be sent to?[facility] [place]   3. Do they need a 30 day or 90 day supply? 90

## 2021-01-24 NOTE — Telephone Encounter (Signed)
Called and spoke with patient, informed patient she still has refills left on her Metoprolol until July 2023. Per patient she will contact the pharmacy. Patient verbalized understanding and call was ended.  Marland Kitchen

## 2021-01-27 ENCOUNTER — Ambulatory Visit: Payer: Self-pay | Admitting: Medical

## 2021-02-26 ENCOUNTER — Other Ambulatory Visit: Payer: Self-pay | Admitting: *Deleted

## 2021-02-26 DIAGNOSIS — F17210 Nicotine dependence, cigarettes, uncomplicated: Secondary | ICD-10-CM

## 2021-02-26 DIAGNOSIS — Z87891 Personal history of nicotine dependence: Secondary | ICD-10-CM

## 2021-03-06 NOTE — Progress Notes (Unsigned)
Office Visit    Patient Name: Tanya Hardin Date of Encounter: 03/06/2021  PCP:  Olin Hauser, [place]  Cardiologist:  Kathlyn Sacramento, MD  Advanced Practice Provider:  No care team member to display Electrophysiologist:  None    HPI    Tanya Hardin is a 56 y.o. female with a hx of paroxysmal supraventricular tachycardia, chest pain, and tobacco abuse presents today for 91-month follow-up appointment.  PSVT history dates back approximately 6 years.  Prior echocardiogram in February 2017 showed normal LV function without regional wall motion abnormalities.  She has been managed on beta-blocker therapy, which she is currently taking daily and is also been evaluated by EP.  She has not wanted ablation up until this point.  In May 2020, she was experiencing worsening dyspnea.  She underwent stress testing, which was low risk and nonischemic.  There was question of a small mild fixed apical anterior defect which was felt to be most likely representative of artifact.  She was last seen September 2022 and had noted to be feeling well up to about a week prior.  She had 1 brief episode of tachypalpitations over the past year.  She has been taking metoprolol 25 mg daily instead of twice daily.  Week prior to her appointment she began experiencing almost nightly lower abdominal and epigastric discomfort associated with a burning sensation in her throat, nausea, and one occasion of vomiting.  ECG was nonacute and highly sensitive troponin was normal.  She left AMA when evaluated in the emergency department due to the long wait.  She had recurrent symptoms few days later and called EMS.  ECG was normal.  She has since been taking Pepcid AC and is already taking omeprazole.  After speaking with her parents.  She has also been trying to watch her diet.  She continued to smoke at this point.  She denied any other CV symptoms.  Today, she ***  Past Medical  History    Past Medical History:  Diagnosis Date   Anemia    h/o    Anxiety    Chest pain    a. 05/2018 MV: EF 55-65%, small, mild, fixed apical ant and apical defect, most likely artifact. No ischemia-->Low risk.   Complication of anesthesia    DURING SECTION, BP DROPPED /N/V   COPD (chronic obstructive pulmonary disease) (HCC)    Depression    Dyspnea    DUE TO COPD   Essential hypertension    GERD (gastroesophageal reflux disease)    due to gallbladder   History of echocardiogram    a. 02/2015 Echo: Ef 60-65%. no rwma.   Hyperlipidemia    PONV (postoperative nausea and vomiting)    X1 ONLY DURING C SECTION   PSVT (paroxysmal supraventricular tachycardia) (HCC)    Tobacco abuse    Past Surgical History:  Procedure Laterality Date   CESAREAN SECTION     x2   MOUTH SURGERY      Allergies  Allergies  Allergen Reactions   Codeine Itching, Nausea And Vomiting and Other (See Comments)    PT STATES SHE FELT NAUSEATED AND DID NOT LIKE THE WAY IT MADE HER FEEL    EKGs/Labs/Other Studies Reviewed:   The following studies were reviewed today:  Lexiscan Myoview 06/07/2018  Low risk, probably normal pharmacologic myocardial perfusion stress test. There is a small in size, mild in severity, fixed apical anterior and apical defect that most likely  represents artifact but cannot rule out small area of non-transmural scar. There is no significant ischemia. The left ventricular ejection fraction is normal (55-65%) with normal wall motion. Sensitivity and specificity of the study are degraded by significant extracardiac activity, motion artifact, and breast attenuation.    EKG:  EKG is *** ordered today.  The ekg ordered today demonstrates ***  Recent Labs: No results found for requested labs within last 8760 hours.  Recent Lipid Panel No results found for: CHOL, TRIG, HDL, CHOLHDL, VLDL, LDLCALC, LDLDIRECT  Risk Assessment/Calculations:  {Does this patient have ATRIAL  FIBRILLATION?:[phone]}  Home Medications   No outpatient medications have been marked as taking for the 03/07/21 encounter (Appointment) with Kathlen Mody, Cadence H, PA-C.     Review of Systems   ***   All other systems reviewed and are otherwise negative except as noted above.  Physical Exam    VS:  LMP 06/27/2014  , BMI There is no height or weight on file to calculate BMI.  Wt Readings from Last 3 Encounters:  03/29/20 190 lb (86.2 kg)  02/09/20 187 lb (84.8 kg)  01/04/20 185 lb 9.6 oz (84.2 kg)     GEN: Well nourished, well developed, in no acute distress. HEENT: normal. Neck: Supple, no JVD, carotid bruits, or masses. Cardiac: ***RRR, no murmurs, rubs, or gallops. No clubbing, cyanosis, edema.  ***Radials/PT 2+ and equal bilaterally.  Respiratory:  ***Respirations regular and unlabored, clear to auscultation bilaterally. GI: Soft, nontender, nondistended. MS: No deformity or atrophy. Skin: Warm and dry, no rash. Neuro:  Strength and sensation are intact. Psych: Normal affect.  Assessment & Plan    GERD/epigastric pain  PSVT  Hypertension  Tobacco abuse    Disposition: Follow up {follow up:15908} with Kathlyn Sacramento, MD or APP.  Signed, Elgie Collard, PA-C 03/06/2021, 4:01 PM [facility]

## 2021-03-07 ENCOUNTER — Ambulatory Visit: Payer: Self-pay | Admitting: Medical

## 2021-03-07 DIAGNOSIS — I471 Supraventricular tachycardia: Secondary | ICD-10-CM

## 2021-03-07 DIAGNOSIS — K219 Gastro-esophageal reflux disease without esophagitis: Secondary | ICD-10-CM

## 2021-03-07 DIAGNOSIS — Z720 Tobacco use: Secondary | ICD-10-CM

## 2021-03-07 DIAGNOSIS — I10 Essential (primary) hypertension: Secondary | ICD-10-CM

## 2021-03-26 ENCOUNTER — Encounter: Payer: Self-pay | Admitting: Acute Care

## 2021-03-26 ENCOUNTER — Other Ambulatory Visit: Payer: Self-pay

## 2021-03-26 ENCOUNTER — Ambulatory Visit (INDEPENDENT_AMBULATORY_CARE_PROVIDER_SITE_OTHER): Payer: Self-pay | Admitting: Acute Care

## 2021-03-26 DIAGNOSIS — F17210 Nicotine dependence, cigarettes, uncomplicated: Secondary | ICD-10-CM

## 2021-03-26 NOTE — Progress Notes (Signed)
Virtual Visit via Telephone Note ? ?I connected with Tanya Hardin on 03/26/21 at 10:00 AM EST by telephone and verified that I am speaking with the correct person using two identifiers. ? ?Location: ?Patient:  At home ?Provider:  [address] [place], [place], [address]  ?  ?Shared Decision Making Visit Lung Cancer Screening Program ?([phone]) ? ? ?Eligibility: ?Age 56 y.o. ?Pack Years Smoking History Calculation 60pack year smoking history ?(# packs/per year x # years smoked) ?Recent History of coughing up blood  no ?Unexplained weight loss? no ?( >Than 15 pounds within the last 6 months ) ?Prior History Lung / other cancer no ?(Diagnosis within the last 5 years already requiring surveillance chest CT Scans). ?Smoking Status Current Smoker ?Former Smokers: Years since quit: NA ? Quit Date:  NA ? ?Visit Components: ?Discussion included one or more decision making aids. yes ?Discussion included risk/benefits of screening. yes ?Discussion included potential follow up diagnostic testing for abnormal scans. yes ?Discussion included meaning and risk of over diagnosis. yes ?Discussion included meaning and risk of False Positives. yes ?Discussion included meaning of total radiation exposure. yes ? ?Counseling Included: ?Importance of adherence to annual lung cancer LDCT screening. yes ?Impact of comorbidities on ability to participate in the program. yes ?Ability and willingness to under diagnostic treatment. yes ? ?Smoking Cessation Counseling: ?Current Smokers:  ?Discussed importance of smoking cessation. yes ?Information about tobacco cessation classes and interventions provided to patient. yes ?Patient provided with "ticket" for LDCT Scan. yes ?Symptomatic Patient. no ? Counseling NA ?Diagnosis Code: Tobacco Use Z72.0 ?Asymptomatic Patient yes ? Counseling (Intermediate counseling: > three minutes counseling) A2505 ?Former Smokers:  ?Discussed the importance of maintaining cigarette abstinence. yes ?Diagnosis  Code: Personal History of Nicotine Dependence. L97.673 ?Information about tobacco cessation classes and interventions provided to patient. Yes ?Patient provided with "ticket" for LDCT Scan. yes ?Written Order for Lung Cancer Screening with LDCT placed in Epic. Yes ?(CT Chest Lung Cancer Screening Low Dose W/O CM) [id number] ?Z12.2-Screening of respiratory organs ?Z87.891-Personal history of nicotine dependence ? ?I have spent 25 minutes of face to face/ virtual visit   time with  Tanya Hardin discussing the risks and benefits of lung cancer screening. We viewed / discussed a power point together that explained in detail the above noted topics. We paused at intervals to allow for questions to be asked and answered to ensure understanding.We discussed that the single most powerful action that she can take to decrease her risk of developing lung cancer is to quit smoking. We discussed whether or not she is ready to commit to setting a quit date. We discussed options for tools to aid in quitting smoking including nicotine replacement therapy, non-nicotine medications, support groups, Quit Smart classes, and behavior modification. We discussed that often times setting smaller, more achievable goals, such as eliminating 1 cigarette a day for a week and then 2 cigarettes a day for a week can be helpful in slowly decreasing the number of cigarettes smoked. This allows for a sense of accomplishment as well as providing a clinical benefit. I provided  her  with smoking cessation  information  with contact information for community resources, classes, free nicotine replacement therapy, and access to mobile apps, text messaging, and on-line smoking cessation help. I have also provided  her  the office contact information in the event she needs to contact me, or the screening staff. We discussed the time and location of the scan, and that either Abigail Miyamoto RN, Angelique Blonder  Buckner, RN  or I will call / send a letter with the results  within 24-72 hours of receiving them. The patient verbalized understanding of all of  the above and had no further questions upon leaving the office. They have my contact information in the event they have any further questions. ? ?I spent 3 minutes counseling on smoking cessation and the health risks of continued tobacco abuse. ? ?I explained to the patient that there has been a high incidence of coronary artery disease noted on these exams. I explained that this is a non-gated exam therefore degree or severity cannot be determined. This patient is not on statin therapy. I have asked the patient to follow-up with their PCP regarding any incidental finding of coronary artery disease and management with diet or medication as their PCP  feels is clinically indicated. The patient verbalized understanding of the above and had no further questions upon completion of the visit. ? ?  ? ? ?Bevelyn Ngo, NP ?03/26/2021 ? ? ? ? ? ? ?

## 2021-03-26 NOTE — Patient Instructions (Signed)
Thank you for participating in the [facility] Screening Program. °It was our pleasure to meet you today. °We will call you with the results of your scan within the next few days. °Your scan will be assigned a Lung RADS category score by the physicians reading the scans.  °This Lung RADS score determines follow up scanning.  °See below for description of categories, and follow up screening recommendations. °We will be in touch to schedule your follow up screening annually or based on recommendations of our providers. °We will fax a copy of your scan results to your Primary Care Physician, or the physician who referred you to the program, to ensure they have the results. °Please call the office if you have any questions or concerns regarding your scanning experience or results.  °Our office number is [phone]. °Please speak with Denise Phelps, RN. She is our Lung Cancer Screening RN. °If she is unavailable when you call, please have the office staff send her a message. She will return your call at her earliest convenience. °Remember, if your scan is normal, we will scan you annually as long as you continue to meet the criteria for the program. (Age 55-77, Current smoker or smoker who has quit within the last 15 years). °If you are a smoker, remember, quitting is the single most powerful action that you can take to decrease your risk of lung cancer and other pulmonary, breathing related problems. °We know quitting is hard, and we are here to help.  °Please let us know if there is anything we can do to help you meet your goal of quitting. °If you are a former smoker, congratulations. We are proud of you! Remain smoke free! °Remember you can refer friends or family members through the number above.  °We will screen them to make sure they meet criteria for the program. °Thank you for helping us take better care of you by participating in Lung Screening. ° °You can receive free nicotine replacement therapy  ( patches, gum or mints) by calling 1-800-QUIT NOW. Please call so we can get you on the path to becoming  a non-smoker. I know it is hard, but you can do this! ° °Lung RADS Categories: ° °Lung RADS 1: no nodules or definitely non-concerning nodules.  °Recommendation is for a repeat annual scan in 12 months. ° °Lung RADS 2:  nodules that are non-concerning in appearance and behavior with a very low likelihood of becoming an active cancer. °Recommendation is for a repeat annual scan in 12 months. ° °Lung RADS 3: nodules that are probably non-concerning , includes nodules with a low likelihood of becoming an active cancer.  Recommendation is for a 6-month repeat screening scan. Often noted after an upper respiratory illness. We will be in touch to make sure you have no questions, and to schedule your 6-month scan. ° °Lung RADS 4 A: nodules with concerning findings, recommendation is most often for a follow up scan in 3 months or additional testing based on our provider's assessment of the scan. We will be in touch to make sure you have no questions and to schedule the recommended 3 month follow up scan. ° °Lung RADS 4 B:  indicates findings that are concerning. We will be in touch with you to schedule additional diagnostic testing based on our provider's  assessment of the scan. ° °Hypnosis for smoking cessation  °[employer] °[phone] ° °Acupuncture for smoking cessation  °[facility] °[phone]  °

## 2021-03-27 ENCOUNTER — Ambulatory Visit: Payer: Self-pay

## 2021-04-03 ENCOUNTER — Ambulatory Visit: Payer: Self-pay | Admitting: Medical

## 2021-04-10 ENCOUNTER — Ambulatory Visit: Admission: RE | Admit: 2021-04-10 | Payer: Self-pay | Source: Ambulatory Visit

## 2021-05-12 ENCOUNTER — Ambulatory Visit: Payer: Self-pay | Admitting: Medical

## 2021-05-12 NOTE — Progress Notes (Deleted)
Cardiology Office Note:    Date:  05/12/2021   ID:  Tanya Hardin, DOB 1965/09/24, MRN [id number]  PCP:  Smitty Cords, DO  [facility] Cardiologist:  Lorine Bears, MD  [facility] Electrophysiologist:  None   Referring MD: Saralyn Pilar *   Chief Complaint: ***  History of Present Illness:    Tanya Hardin is a 56 y.o. female with a hx of pSVT, chest pain (NICM), and tobacco use who presents for follow-up.  PSVT history dates back approximately for 6 years.  Prior echocardiogram in February 2017 showed normal LV function without regional wall motion abnormalities.  She has been managed with beta-blocker therapy, which she is currently taking daily, and has also been evaluated by electrophysiology.  She has not wanted ablation up to this point.  In May 2020, she was experiencing worsening dyspnea.  She underwent stress testing, which was low risk and nonischemic.  There was question of a small, mild, fixed apical anterior and apical defect which was felt to be most likely representative of artifact.  She was last seen 09/21/19 for ER f/u for GERD/epigastric pain. ER work-up was unremarkable. Sxs were better on H2 blocker and PPI. Recommended PCP follow-up. It was recommended she tale metoprolol twice daily for palpitations.   Today,   Past Medical History:  Diagnosis Date   Anemia    h/o    Anxiety    Chest pain    a. 05/2018 MV: EF 55-65%, small, mild, fixed apical ant and apical defect, most likely artifact. No ischemia-->Low risk.   Complication of anesthesia    DURING SECTION, BP DROPPED /N/V   COPD (chronic obstructive pulmonary disease) (HCC)    Depression    Dyspnea    DUE TO COPD   Essential hypertension    GERD (gastroesophageal reflux disease)    due to gallbladder   History of echocardiogram    a. 02/2015 Echo: Ef 60-65%. no rwma.   Hyperlipidemia    PONV (postoperative nausea and vomiting)    X1 ONLY DURING C SECTION   PSVT  (paroxysmal supraventricular tachycardia) (HCC)    Tobacco abuse     Past Surgical History:  Procedure Laterality Date   CESAREAN SECTION     x2   MOUTH SURGERY      Current Medications: No outpatient medications have been marked as taking for the 05/12/21 encounter (Appointment) with Fransico Michael, Elleana Stillson H, PA-C.     Allergies:   Codeine   Social History   Socioeconomic History   Marital status: Single    Spouse name: Not on file   Number of children: Not on file   Years of education: Not on file   Highest education level: Not on file  Occupational History   Not on file  Tobacco Use   Smoking status: Every Day    Packs/day: 1.50    Years: 40.00    Pack years: 60.00    Types: Cigarettes   Smokeless tobacco: Never   Tobacco comments:    1ppd as ot 05/24/20  //ep  Vaping Use   Vaping Use: Never used  Substance and Sexual Activity   Alcohol use: Not Currently   Drug use: Not Currently    Comment: LAST USED WHILE IN HER 20'S   Sexual activity: Not on file  Other Topics Concern   Not on file  Social History Narrative   Not on file   Social Determinants of Health   Financial Resource Strain: Not on  file  Food Insecurity: Not on file  Transportation Needs: Not on file  Physical Activity: Not on file  Stress: Not on file  Social Connections: Not on file     Family History: The patient's Family history is unknown by patient.  ROS:   Please see the history of present illness.     All other systems reviewed and are negative.  EKGs/Labs/Other Studies Reviewed:    The following studies were reviewed today:  Echo 2017 Study Conclusions   - Left ventricle: The cavity size was normal. Wall thickness was    normal. Systolic function was normal. The estimated ejection    fraction was in the range of 60% to 65%. Wall motion was normal;    there were no regional wall motion abnormalities. Left    ventricular diastolic function parameters were normal.   Impressions:    - Normal study.   Myoview Lexiscan 2020 Low risk, probably normal pharmacologic myocardial perfusion stress test. There is a small in size, mild in severity, fixed apical anterior and apical defect that most likely represents artifact but cannot rule out small area of non-transmural scar. There is no significant ischemia. The left ventricular ejection fraction is normal (55-65%) with normal wall motion. Sensitivity and specificity of the study are degraded by significant extracardiac activity, motion artifact, and breast attenuation.  EKG:  EKG is *** ordered today.  The ekg ordered today demonstrates ***  Recent Labs: No results found for requested labs within last 8760 hours.  Recent Lipid Panel No results found for: CHOL, TRIG, HDL, CHOLHDL, VLDL, LDLCALC, LDLDIRECT   Risk Assessment/Calculations:   {Does this patient have ATRIAL FIBRILLATION?:[phone]}   Physical Exam:    VS:  LMP 06/27/2014     Wt Readings from Last 3 Encounters:  03/29/20 190 lb (86.2 kg)  02/09/20 187 lb (84.8 kg)  01/04/20 185 lb 9.6 oz (84.2 kg)     GEN: *** Well nourished, well developed in no acute distress HEENT: Normal NECK: No JVD; No carotid bruits LYMPHATICS: No lymphadenopathy CARDIAC: ***RRR, no murmurs, rubs, gallops RESPIRATORY:  Clear to auscultation without rales, wheezing or rhonchi  ABDOMEN: Soft, non-tender, non-distended MUSCULOSKELETAL:  No edema; No deformity  SKIN: Warm and dry NEUROLOGIC:  Alert and oriented x 3 PSYCHIATRIC:  Normal affect   ASSESSMENT:    No diagnosis found. PLAN:    In order of problems listed above:  GERD/epigastric pain  pSVT  HTN  Tobacco use    Disposition: Follow up {follow up:15908} with ***   Shared Decision Making/Informed Consent   {Are you ordering a CV Procedure (e.g. stress test, cath, DCCV, TEE, etc)?   Press F2        :[id number]}    Signed, Tagan Bartram Ardelle Lesches  05/12/2021 8:37 AM    [facility]

## 2021-05-13 ENCOUNTER — Encounter: Payer: Self-pay | Admitting: Medical

## 2021-08-06 ENCOUNTER — Ambulatory Visit: Payer: Self-pay | Admitting: Family Medicine

## 2021-08-17 ENCOUNTER — Other Ambulatory Visit: Payer: Self-pay | Admitting: Internal Medicine

## 2021-08-23 IMAGING — CR DG CHEST 2V
2 series · 2 of 2 positions shown · non-contrast
Comparison: 09/16/2019

CLINICAL DATA: Chest pain

EXAM:
CHEST - 2 VIEW

[chest pa]
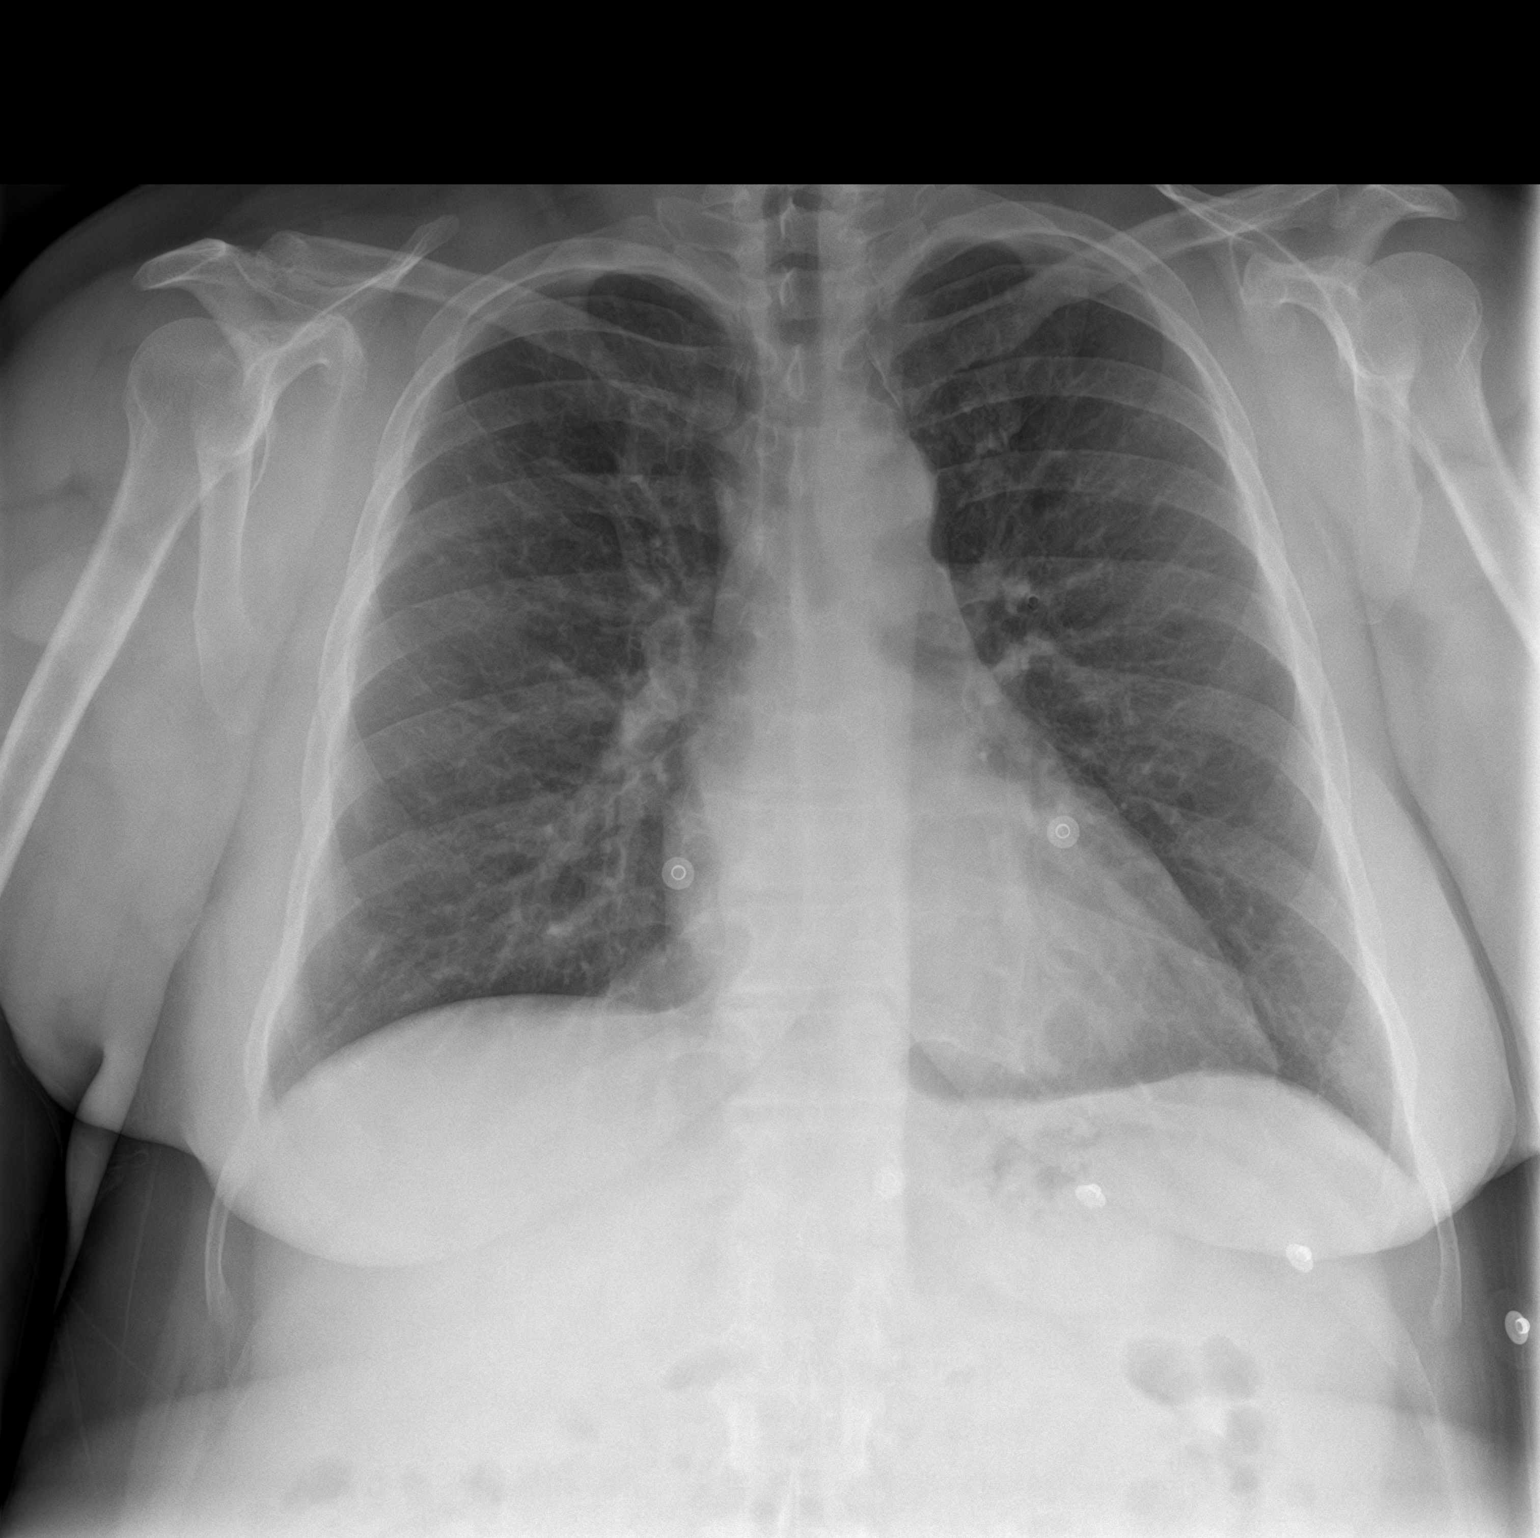

[chest lat]
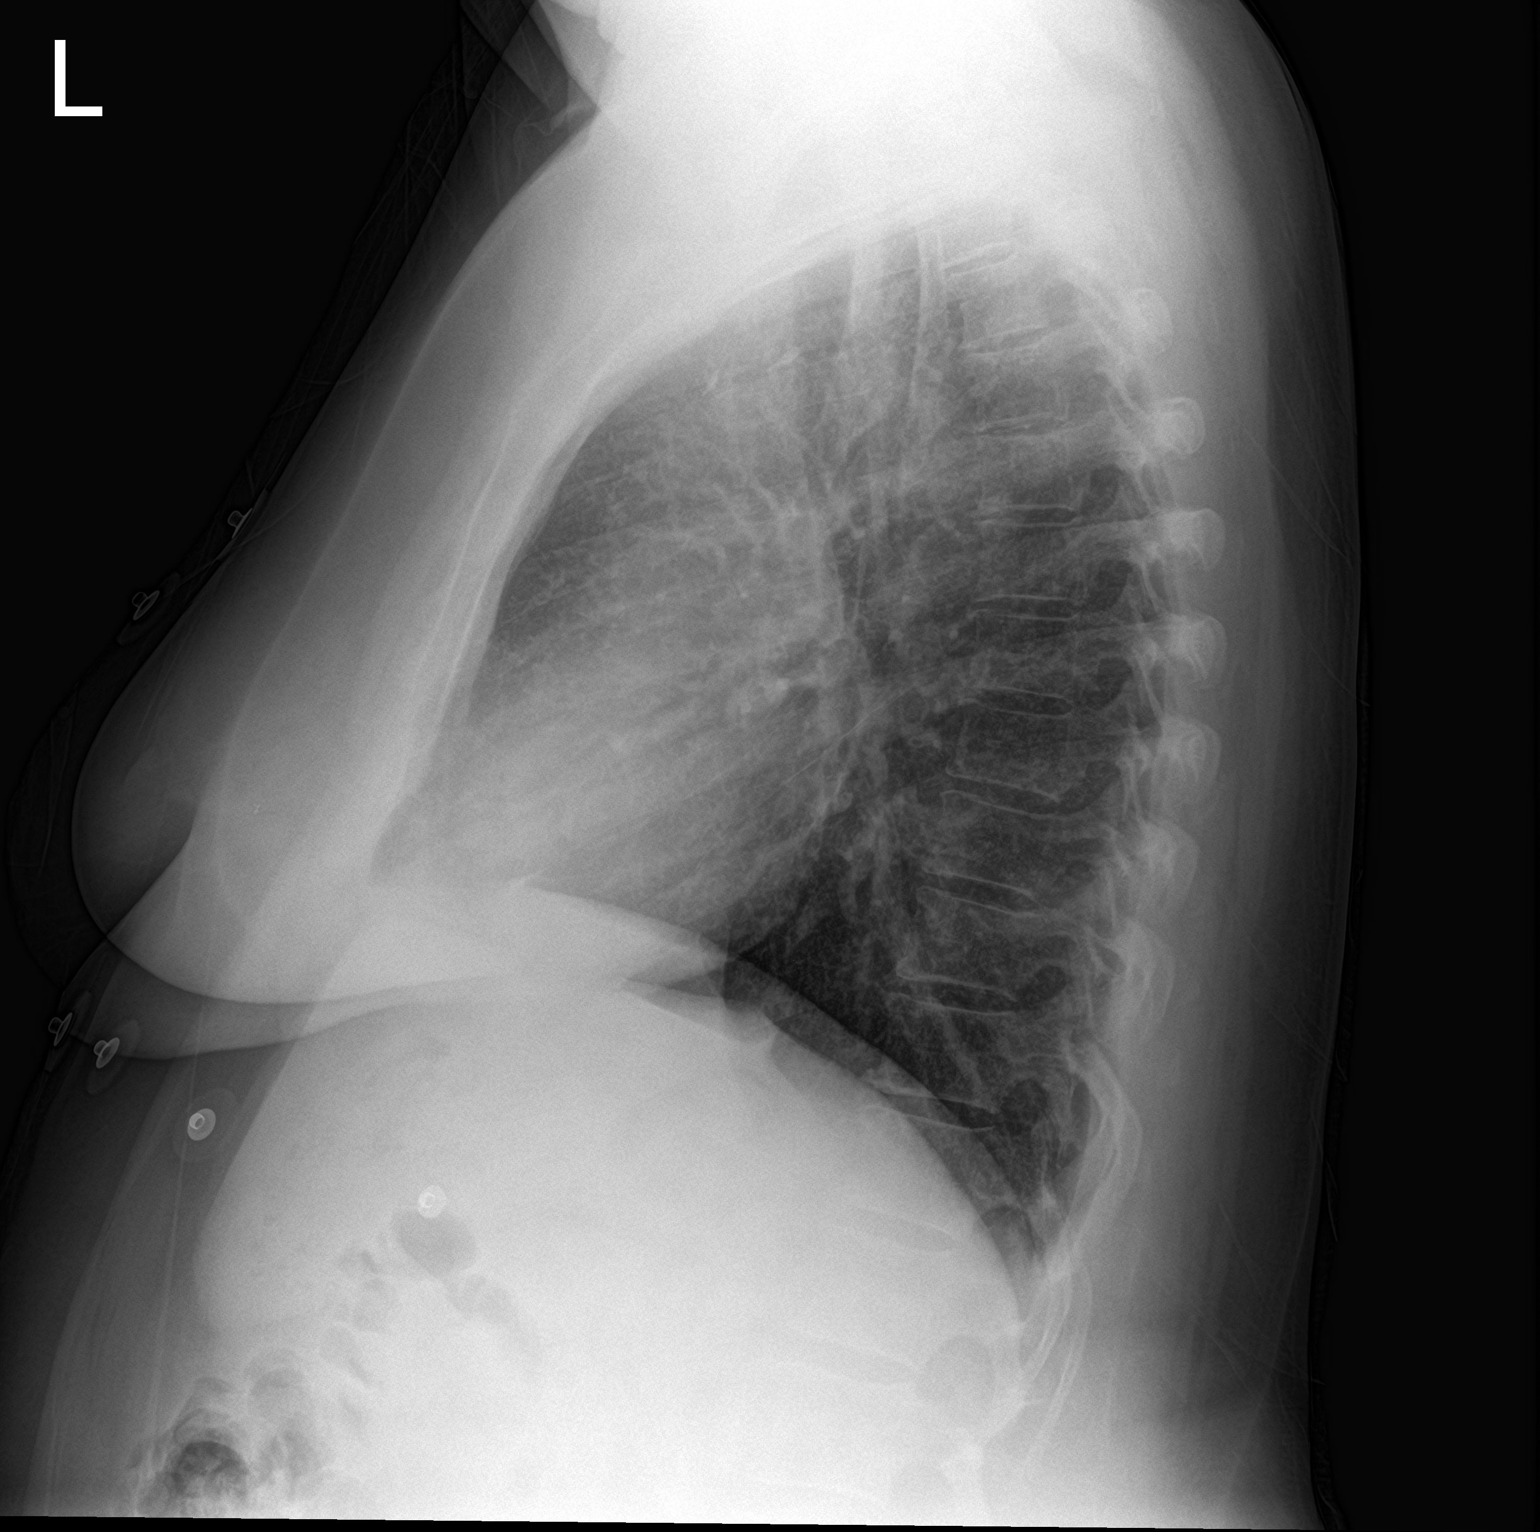

[2 of 2 positions shown; findings below may reference images not displayed]

FINDINGS: The heart size and mediastinal contours are within normal limits.
Both lungs are clear. The visualized skeletal structures are
unremarkable.
IMPRESSION: No active cardiopulmonary disease.

## 2021-09-04 ENCOUNTER — Other Ambulatory Visit: Payer: Self-pay | Admitting: Internal Medicine

## 2021-09-05 ENCOUNTER — Telehealth: Payer: Self-pay | Admitting: Internal Medicine

## 2021-09-05 NOTE — Telephone Encounter (Signed)
*  STAT* If patient is at the pharmacy, call can be transferred to refill team.   1. Which medications need to be refilled? (please list name of each medication and dose if known)   metoprolol tartrate (LOPRESSOR) 25 MG tablet  2. Which pharmacy/location (including street and city if local pharmacy) is medication to be sent to?  [id number] - [place], [place] - [address]  3. Do they need a 30 day or 90 day supply? 90 day  Patient stated she is completely out of this medication.

## 2021-09-24 IMAGING — US US ABDOMEN LIMITED
1 series · 14 of 25 positions shown · non-contrast
Comparison: None.

CLINICAL DATA: 54-year-old female with upper abdominal pain.

EXAM:
ULTRASOUND ABDOMEN LIMITED RIGHT UPPER QUADRANT

[Series 1: us abdomen limited ruq (liver/gb) · 14 of 38 slices shown]
[im 1/38]
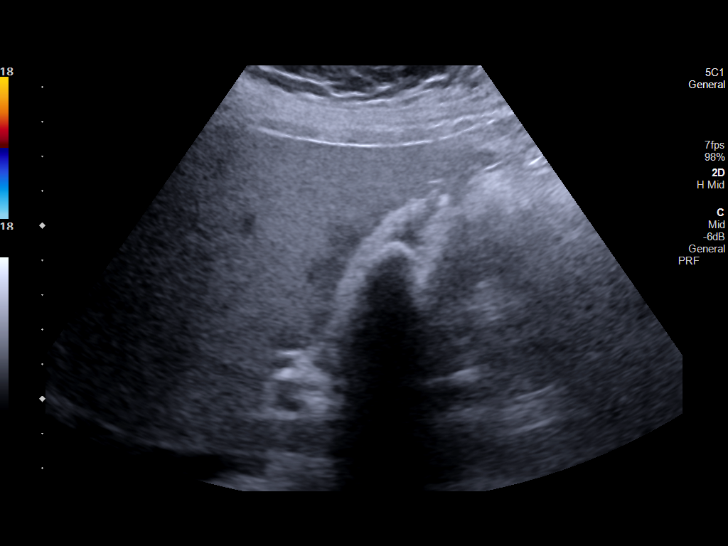
[im 4/38]
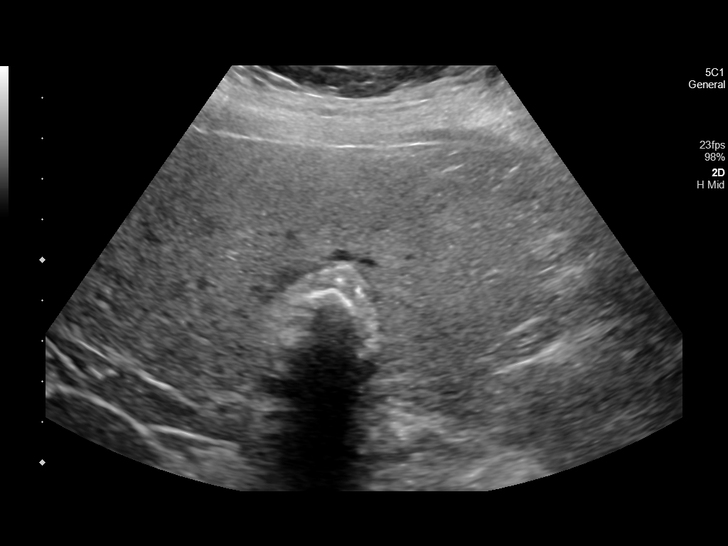
[im 7/38]
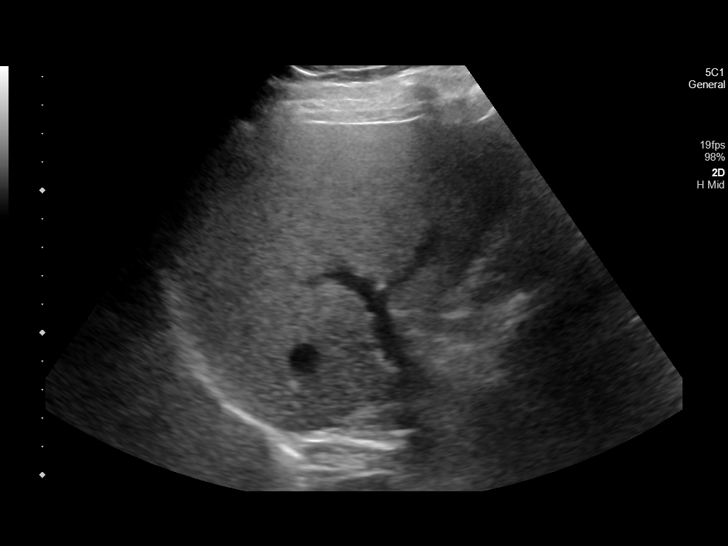
[im 10/38]
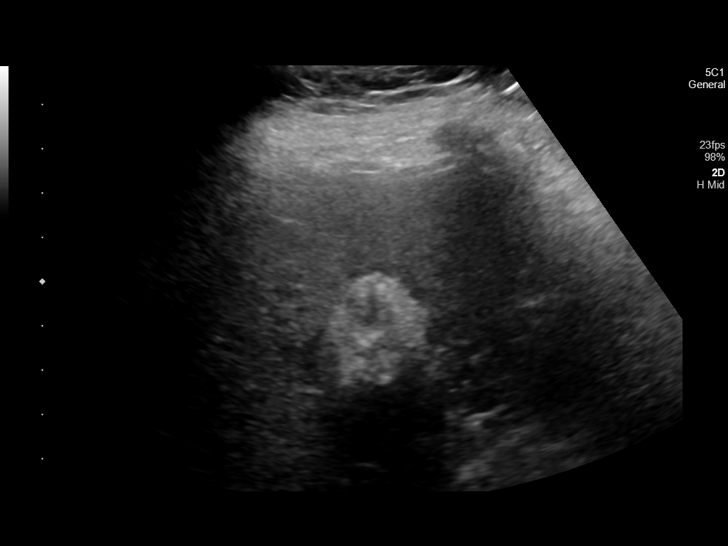
[im 13/38]
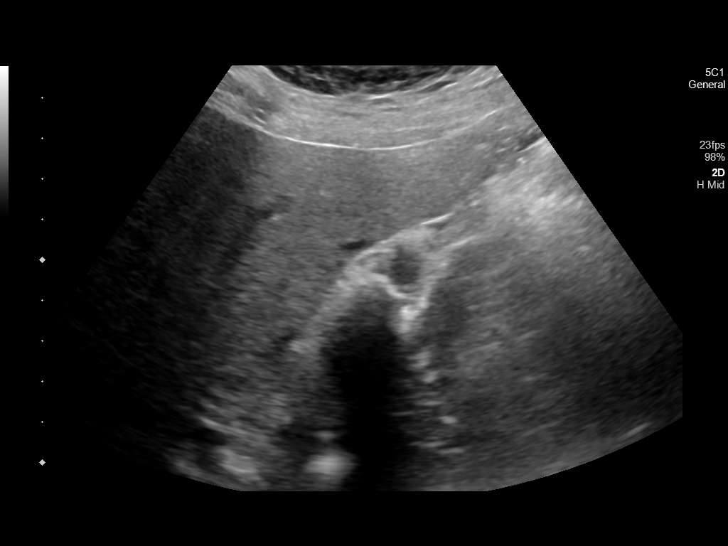
[im 14/38]
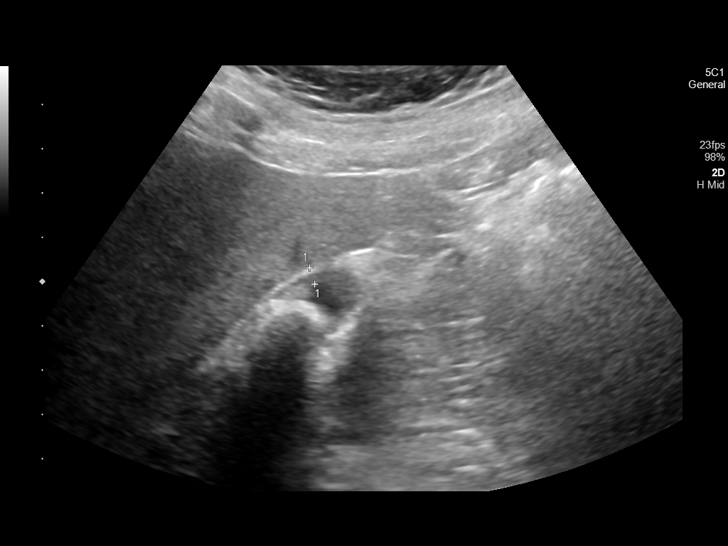
[im 17/38]
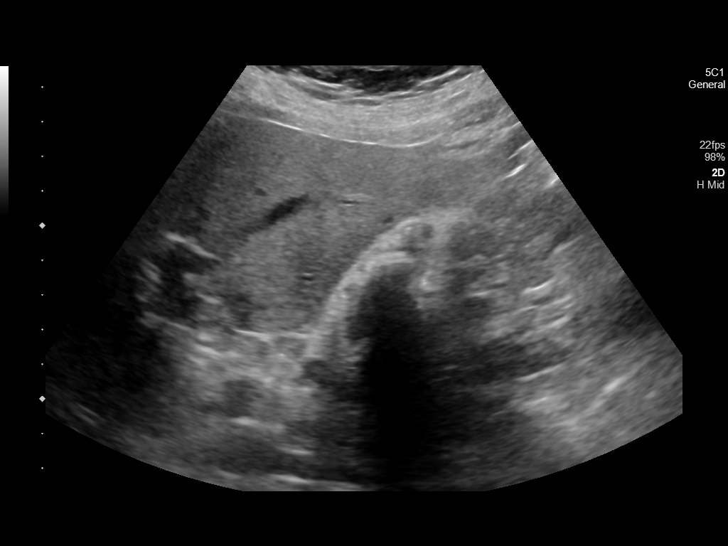
[im 21/38]
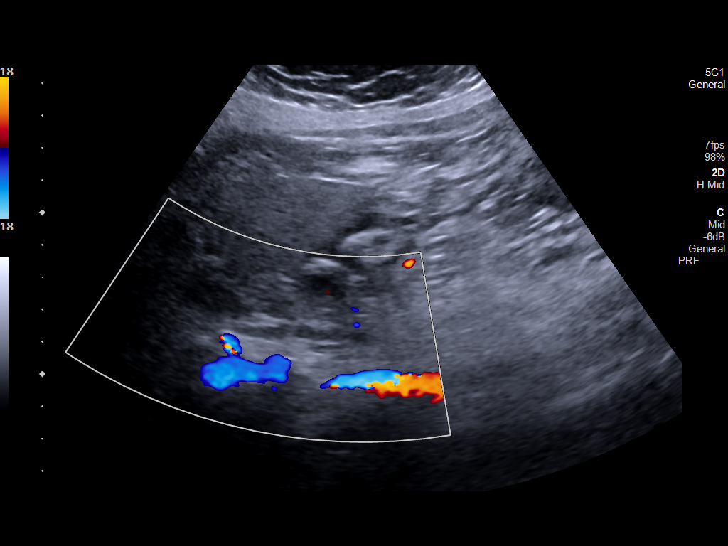
[im 24/38]
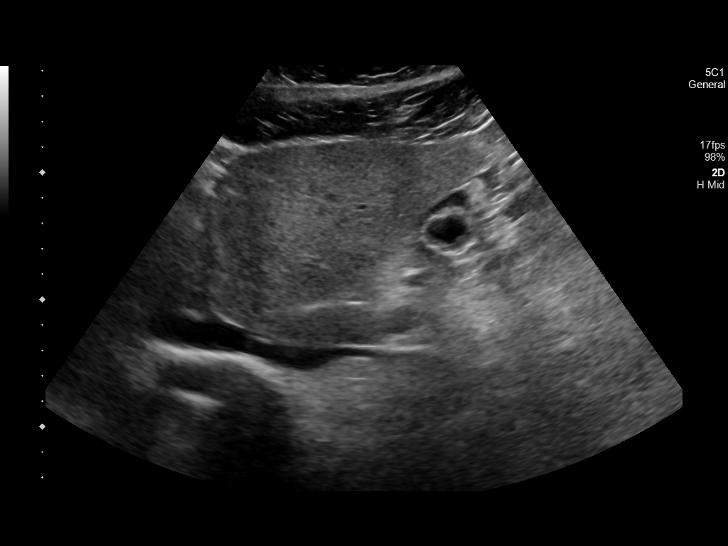
[im 25/38]
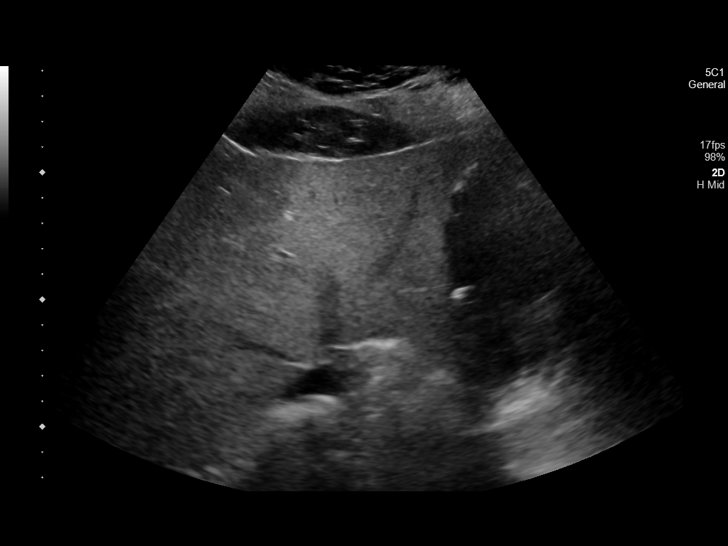
[im 28/38]
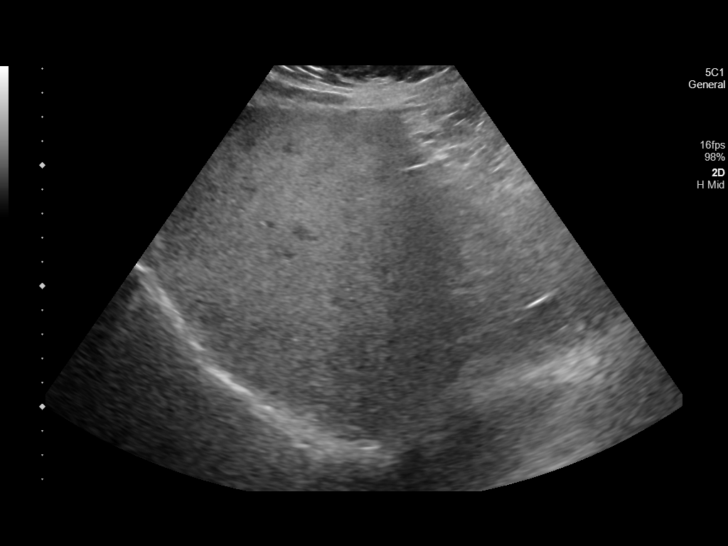
[im 31/38]
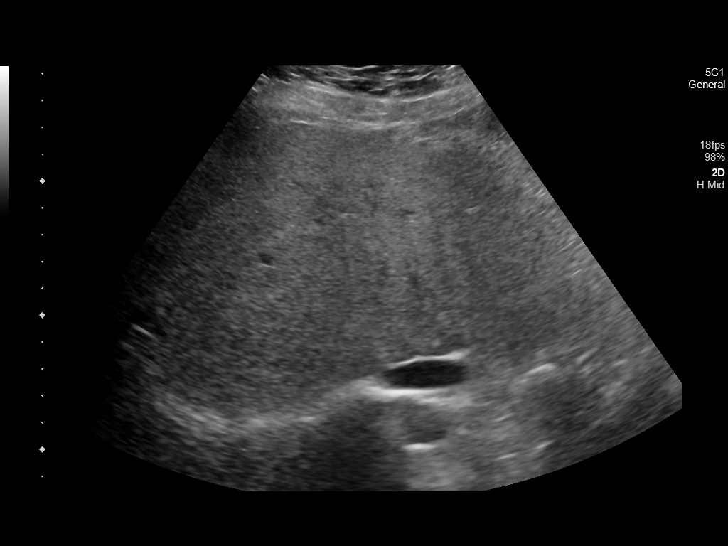
[im 34/38]
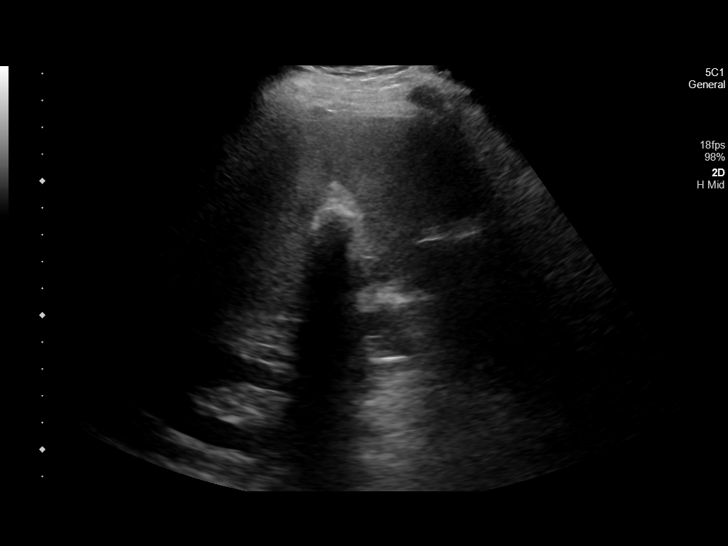
[im 38/38]
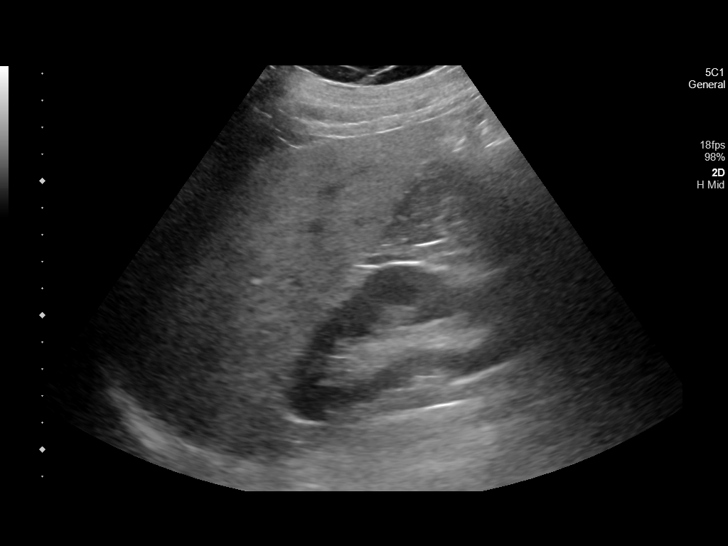

[14 of 25 positions shown; findings below may reference images not displayed]

FINDINGS: Gallbladder:

There is a 15 mm gallstone. Mild thickened appearance of the
gallbladder wall measuring 4 mm, likely partly related to partial
contraction. No pericholecystic fluid. Negative sonographic Murphy's
sign.

Common bile duct:

Diameter: 3 mm

Liver:

There is diffuse increased liver echogenicity most commonly seen in
the setting of fatty infiltration. Superimposed inflammation or
fibrosis is not excluded. Clinical correlation is recommended.
Portal vein is patent on color Doppler imaging with normal direction
of blood flow towards the liver.

Other: None.
IMPRESSION: 1. Cholelithiasis without sonographic evidence of acute
cholecystitis.
2. Fatty liver.

## 2021-09-27 ENCOUNTER — Other Ambulatory Visit: Payer: Self-pay | Admitting: Nurse Practitioner

## 2021-09-29 ENCOUNTER — Telehealth: Payer: Self-pay | Admitting: Internal Medicine

## 2021-09-29 NOTE — Telephone Encounter (Signed)
The refill rqst should be denied. Refill can be granted if the pt keeps her upcoming appt.

## 2021-09-29 NOTE — Telephone Encounter (Signed)
Called patient to inform her that we are not able to refill medication until she is seen at her upcoming appointment which will be 10/10/2021. Patient has not been seen since 09/2019.

## 2021-09-29 NOTE — Telephone Encounter (Signed)
Hi Lisa,  Would you like for me to refill this medication? The patient has a history of cancelling appointments and she has no showed once. She was last seen 09/2019.

## 2021-09-29 NOTE — Telephone Encounter (Signed)
*  STAT* If patient is at the pharmacy, call can be transferred to refill team.   1. Which medications need to be refilled? (please list name of each medication and dose if known) metoprolol tartrate (LOPRESSOR) 25 MG tablet  2. Which pharmacy/location (including street and city if local pharmacy) is medication to be sent to? [id number] - [place], [place] - [address]  3. Do they need a 30 day or 90 day supply? 14 day   Pt has an appt with Fransico Michael, [place] 10/10/21. Pt is completely out

## 2021-10-10 ENCOUNTER — Ambulatory Visit: Payer: Self-pay | Attending: Medical | Admitting: Medical

## 2021-10-10 ENCOUNTER — Other Ambulatory Visit
Admission: RE | Admit: 2021-10-10 | Discharge: 2021-10-10 | Disposition: A | Discharge status: Home / Self Care | Payer: Self-pay | Source: Ambulatory Visit | Attending: Medical | Admitting: Medical

## 2021-10-10 ENCOUNTER — Encounter: Payer: Self-pay | Admitting: Medical

## 2021-10-10 VITALS — BP 142/72 | HR 58 | Ht 62.0 in | Wt 193.2 lb

## 2021-10-10 DIAGNOSIS — I471 Supraventricular tachycardia: Secondary | ICD-10-CM | POA: Insufficient documentation

## 2021-10-10 DIAGNOSIS — E785 Hyperlipidemia, unspecified: Secondary | ICD-10-CM

## 2021-10-10 DIAGNOSIS — Z13220 Encounter for screening for lipoid disorders: Secondary | ICD-10-CM | POA: Insufficient documentation

## 2021-10-10 DIAGNOSIS — M79604 Pain in right leg: Secondary | ICD-10-CM

## 2021-10-10 DIAGNOSIS — R079 Chest pain, unspecified: Secondary | ICD-10-CM

## 2021-10-10 DIAGNOSIS — R0602 Shortness of breath: Secondary | ICD-10-CM

## 2021-10-10 DIAGNOSIS — M79605 Pain in left leg: Secondary | ICD-10-CM

## 2021-10-10 DIAGNOSIS — I10 Essential (primary) hypertension: Secondary | ICD-10-CM

## 2021-10-10 LAB — CBC
HCT: 39.3 % (ref 36.0–46.0)
Hemoglobin: 13.7 g/dL (ref 12.0–15.0)
MCH: 30 pg (ref 26.0–34.0)
MCHC: 34.9 g/dL (ref 30.0–36.0)
MCV: 86.2 fL (ref 80.0–100.0)
Platelets: 225 10*3/uL (ref 150–400)
RBC: 4.56 MIL/uL (ref 3.87–5.11)
RDW: 11.9 % (ref 11.5–15.5)
WBC: 7.1 10*3/uL (ref 4.0–10.5)
nRBC: 0 % (ref 0.0–0.2)

## 2021-10-10 LAB — LDL CHOLESTEROL, DIRECT: Direct LDL: 140 mg/dL — ABNORMAL HIGH (ref 0–99)

## 2021-10-10 LAB — COMPREHENSIVE METABOLIC PANEL
ALT: 15 U/L (ref 0–44)
AST: 17 U/L (ref 15–41)
Albumin: 3.6 g/dL (ref 3.5–5.0)
Alkaline Phosphatase: 67 U/L (ref 38–126)
Anion gap: 7 (ref 5–15)
BUN: 10 mg/dL (ref 6–20)
CO2: 24 mmol/L (ref 22–32)
Calcium: 9 mg/dL (ref 8.9–10.3)
Chloride: 110 mmol/L (ref 98–111)
Creatinine, Ser: 0.69 mg/dL (ref 0.44–1.00)
GFR, Estimated: >60 mL/min (ref >60)
Glucose, Bld: 122 mg/dL — ABNORMAL HIGH (ref 70–99)
Potassium: 3.9 mmol/L (ref 3.5–5.1)
Sodium: 141 mmol/L (ref 135–145)
Total Bilirubin: 0.3 mg/dL (ref 0.3–1.2)
Total Protein: 7.8 g/dL (ref 6.5–8.1)

## 2021-10-10 LAB — LIPID PANEL
Cholesterol: 205 mg/dL — ABNORMAL HIGH (ref 0–200)
HDL: 40 mg/dL — ABNORMAL LOW (ref >40)
LDL Cholesterol: 113 mg/dL — ABNORMAL HIGH (ref 0–99)
Total CHOL/HDL Ratio: 5.1 RATIO
Triglycerides: 261 mg/dL — ABNORMAL HIGH (ref <150)
VLDL: 52 mg/dL — ABNORMAL HIGH (ref 0–40)

## 2021-10-10 MED ORDER — METOPROLOL TARTRATE 25 MG PO TABS: 25.0000 mg | ORAL_TABLET | Freq: Two times a day (BID) | ORAL | 0 refills | Status: DC

## 2021-10-10 NOTE — Patient Instructions (Signed)
Medication Instructions:   Your physician recommends that you continue on your current medications as directed. Please refer to the Current Medication list given to you today.   *If you need a refill on your cardiac medications before your next appointment, please call your pharmacy*   Lab Work:  Please go to the Medical mall after your appointment today for a Lab Draw:  Direct LDL Lipid Panel CBC CMP   Testing/Procedures:  Your physician has requested that you have an ankle brachial index (ABI). During this test an ultrasound and blood pressure cuff are used to evaluate the arteries that supply the arms and legs with blood. Allow thirty minutes for this exam. There are no restrictions or special instructions.   Your physician has requested that you have a lower extremity arterial exercise duplex. During this test, exercise and ultrasound are used to evaluate arterial blood flow in the legs. Allow one hour for this exam. There are no restrictions or special instructions.   Your physician has requested that you have an echocardiogram. Echocardiography is a painless test that uses sound waves to create images of your heart. It provides your doctor with information about the size and shape of your heart and how well your heart's chambers and valves are working. This procedure takes approximately one hour. There are no restrictions for this procedure.    Follow-Up: At [facility], you and your health needs are our priority.  As part of our continuing mission to provide you with exceptional heart care, we have created designated Provider Care Teams.  These Care Teams include your primary Cardiologist (physician) and Advanced Practice Providers (APPs -  Physician Assistants and Nurse Practitioners) who all work together to provide you with the care you need, when you need it.  We recommend signing up for the patient portal called "MyChart".  Sign up information is provided on this  After Visit Summary.  MyChart is used to connect with patients for Virtual Visits (Telemedicine).  Patients are able to view lab/test results, encounter notes, upcoming appointments, etc.  Non-urgent messages can be sent to your provider as well.   To learn more about what you can do with MyChart, go to [email or web address].    Your next appointment:   3 month(s)  The format for your next appointment:   In Person  Provider:   You may see Kathlyn Sacramento, MD or one of the following Advanced Practice Providers on your designated Care Team:   Murray Hodgkins, NP Christell Faith, PA-C Cadence Kathlen Mody, PA-C Gerrie Nordmann, NP    Other Instructions   Important Information About Sugar

## 2021-10-10 NOTE — Progress Notes (Signed)
Cardiology Office Note:    Date:  10/10/2021   ID:  SWAY GUTTIERREZ, DOB 04/21/1965, MRN [id number]  PCP:  Smitty Cords, DO  [facility] Cardiologist:  Lorine Bears, MD  [facility] Electrophysiologist:  None   Referring MD: Saralyn Pilar *   Chief Complaint: 2 year follow-up  History of Present Illness:    Tanya Hardin is a 56 y.o. female with a hx of pSVT, chest pain and tobacco abuse who presents for 2 year follow-up.   PSVT history dates back approximately for 5 years.  Prior echocardiogram in February 2017 showed normal LV function without regional wall motion abnormalities.  She has been managed with beta-blocker therapy, which she is currently taking daily, and has also been evaluated by electrophysiology.  She has not wanted ablation up to this point.  In May 2020, she was experiencing worsening dyspnea.  She underwent stress testing, which was low risk and nonischemic.  There was question of a small, mild, fixed apical anterior and apical defect which was felt to be most likely representative of artifact.  Last seen 09/2019 for ER follow-up for palpitations. She was taking metoprolol instead of twice daily. ER visit was for GER/epigastric pain with occasional nausea and vomiting. HS troponin normal x2. She was started on H2 blocker and PPI therapy, PCP follow-up was recommended.   Today, the patient reports issues with her eye, has not seen PCP for this. She has lower leg pain on the left side. It occurs occasionally, seems to be worse at night. She is unsure if it is cramping, Its a deep pain. She also reports bilateral feet pain on the top of her foot.  She has chest pain at the end of the day with stress. She smokes a lot. She has a lot on anxiety. She denies swelling on the feet. BP is mildlyl elevated today. She is taking lopressor 25mg  BID, she needs a refill. She has no insurance and says she does not have a PCP. I recommended she find a  PCP to address non-cardiac issues.     Past Medical History:  Diagnosis Date   Anemia    h/o    Anxiety    Chest pain    a. 05/2018 MV: EF 55-65%, small, mild, fixed apical ant and apical defect, most likely artifact. No ischemia-->Low risk.   Complication of anesthesia    DURING SECTION, BP DROPPED /N/V   COPD (chronic obstructive pulmonary disease) (HCC)    Depression    Dyspnea    DUE TO COPD   Essential hypertension    GERD (gastroesophageal reflux disease)    due to gallbladder   History of echocardiogram    a. 02/2015 Echo: Ef 60-65%. no rwma.   Hyperlipidemia    PONV (postoperative nausea and vomiting)    X1 ONLY DURING C SECTION   PSVT (paroxysmal supraventricular tachycardia) (HCC)    Tobacco abuse     Past Surgical History:  Procedure Laterality Date   CESAREAN SECTION     x2   CHOLECYSTECTOMY     MOUTH SURGERY      Current Medications: Current Meds  Medication Sig   [DISCONTINUED] metoprolol tartrate (LOPRESSOR) 25 MG tablet TAKE 1 TABLET BY MOUTH TWICE A DAY     Allergies:   Codeine   Social History   Socioeconomic History   Marital status: Single    Spouse name: Not on file   Number of children: Not on file  Years of education: Not on file   Highest education level: Not on file  Occupational History   Not on file  Tobacco Use   Smoking status: Every Day    Packs/day: 1.50    Years: 40.00    Total pack years: 60.00    Types: Cigarettes   Smokeless tobacco: Never   Tobacco comments:    1ppd as ot 05/24/20  //ep  Vaping Use   Vaping Use: Never used  Substance and Sexual Activity   Alcohol use: Not Currently   Drug use: Not Currently    Comment: LAST USED WHILE IN HER 20'S   Sexual activity: Not on file  Other Topics Concern   Not on file  Social History Narrative   Not on file   Social Determinants of Health   Financial Resource Strain: Not on file  Food Insecurity: Not on file  Transportation Needs: Not on file  Physical  Activity: Not on file  Stress: Not on file  Social Connections: Not on file     Family History: The patient's Family history is unknown by patient.  ROS:   Please see the history of present illness.     All other systems reviewed and are negative.  EKGs/Labs/Other Studies Reviewed:    The following studies were reviewed today:   Myoview Lexiscan 2020 Narrative & Impression  Low risk, probably normal pharmacologic myocardial perfusion stress test. There is a small in size, mild in severity, fixed apical anterior and apical defect that most likely represents artifact but cannot rule out small area of non-transmural scar. There is no significant ischemia. The left ventricular ejection fraction is normal (55-65%) with normal wall motion. Sensitivity and specificity of the study are degraded by significant extracardiac activity, motion artifact, and breast attenuation.    Echo 02/2015 Study Conclusions   - Left ventricle: The cavity size was normal. Wall thickness was    normal. Systolic function was normal. The estimated ejection    fraction was in the range of 60% to 65%. Wall motion was normal;    there were no regional wall motion abnormalities. Left    ventricular diastolic function parameters were normal.   EKG:  EKG is  ordered today.  The ekg ordered today demonstrates SB 58bpm, nonspecific T wave changes  Recent Labs: No results found for requested labs within last 365 days.  Recent Lipid Panel No results found for: "CHOL", "TRIG", "HDL", "CHOLHDL", "VLDL", "LDLCALC", "LDLDIRECT"   Physical Exam:    VS:  BP (!) 142/72 (BP Location: Left Arm, Patient Position: Sitting, Cuff Size: Normal)   Pulse (!) 58   Ht 5\' 2"  (1.575 m)   Wt 193 lb 3.2 oz (87.6 kg)   LMP 06/27/2014   SpO2 98%   BMI 35.34 kg/m     Wt Readings from Last 3 Encounters:  10/10/21 193 lb 3.2 oz (87.6 kg)  03/29/20 190 lb (86.2 kg)  02/09/20 187 lb (84.8 kg)     GEN:  Well nourished, well  developed in no acute distress HEENT: Normal NECK: No JVD; No carotid bruits LYMPHATICS: No lymphadenopathy CARDIAC: RRR, no murmurs, rubs, gallops RESPIRATORY: diffusely diminished  ABDOMEN: Soft, non-tender, non-distended MUSCULOSKELETAL:  No edema; No deformity  SKIN: Warm and dry NEUROLOGIC:  Alert and oriented x 3 PSYCHIATRIC:  Normal affect   ASSESSMENT:    1. SOB (shortness of breath)   2. Paroxysmal supraventricular tachycardia (HCC)   3. Leg pain, bilateral   4. Screening for hyperlipidemia  5. Essential hypertension   6. Dyslipidemia   7. Chest pain of uncertain etiology    PLAN:    In order of problems listed above:  DOE/Atypical chest pain Patient reports DOE and occasional chest pain. Prior echo in 2017 showed normal LVEF. Lexiscan Myoview  in 2020 was non-ischemic. Smoking and general deconditioning likely contributing to symptoms, patient also feels anxiety is playing a role. EKG shows SB 58bpm with no significant changes. I will check CMET and CBC. I will update an echocardiogram.   Leg pain She reports b/l leg pain, worse on the left. Pulses distant on exam. I will order b/l ABIs  Tobacco use Reports she smokes way too much, cessation advised. She follows with Pulmonology.   Dyslipidemia I will update a lipid panel. She is not on a statin at baseline.   HTN BP elevated today. She is on Lopressor 25mg  BID, I will refill this. We will evaluate at follow-up, may need to switch to  Coreg.    Disposition: Follow up in 3 month(s) with MD/APP    Signed, Haroldine Redler Ninfa Meeker, PA-C  10/10/2021 12:37 PM    [facility]

## 2021-11-19 ENCOUNTER — Ambulatory Visit: Payer: Self-pay

## 2021-11-19 ENCOUNTER — Emergency Department: Admission: EM | Admit: 2021-11-19 | Discharge: 2021-11-19 | Discharge status: Against Medical Advice | Payer: Self-pay

## 2021-11-19 ENCOUNTER — Encounter: Payer: Self-pay | Admitting: Emergency Medicine

## 2021-11-19 ENCOUNTER — Ambulatory Visit
Admission: EM | Admit: 2021-11-19 | Discharge: 2021-11-19 | Disposition: A | Discharge status: Home / Self Care | Payer: Self-pay | Attending: Physician Assistant | Admitting: Physician Assistant

## 2021-11-19 ENCOUNTER — Ambulatory Visit (INDEPENDENT_AMBULATORY_CARE_PROVIDER_SITE_OTHER): Payer: Self-pay

## 2021-11-19 ENCOUNTER — Ambulatory Visit: Payer: Self-pay | Attending: Medical

## 2021-11-19 DIAGNOSIS — R0602 Shortness of breath: Secondary | ICD-10-CM

## 2021-11-19 DIAGNOSIS — N760 Acute vaginitis: Secondary | ICD-10-CM | POA: Insufficient documentation

## 2021-11-19 DIAGNOSIS — N3000 Acute cystitis without hematuria: Secondary | ICD-10-CM | POA: Insufficient documentation

## 2021-11-19 DIAGNOSIS — M79605 Pain in left leg: Secondary | ICD-10-CM

## 2021-11-19 DIAGNOSIS — M79604 Pain in right leg: Secondary | ICD-10-CM

## 2021-11-19 LAB — URINALYSIS, ROUTINE W REFLEX MICROSCOPIC
Bilirubin Urine: NEGATIVE
Glucose, UA: NEGATIVE mg/dL
Ketones, ur: NEGATIVE mg/dL
Nitrite: NEGATIVE
Protein, ur: NEGATIVE mg/dL
Specific Gravity, Urine: 1.01 (ref 1.005–1.030)
pH: 5.5 (ref 5.0–8.0)

## 2021-11-19 LAB — URINALYSIS, MICROSCOPIC (REFLEX)

## 2021-11-19 LAB — WET PREP, GENITAL
Sperm: NONE SEEN
Trich, Wet Prep: NONE SEEN
WBC, Wet Prep HPF POC: <10 (ref <10)

## 2021-11-19 LAB — ECHOCARDIOGRAM COMPLETE
AR max vel: 1.8 cm2
AV Area VTI: 1.82 cm2
AV Area mean vel: 1.68 cm2
AV Mean grad: 4 mmHg
AV Peak grad: 7.4 mmHg
Ao pk vel: 1.36 m/s
Area-P 1/2: 4.6 cm2
Calc EF: 57.2 %
S' Lateral: 2.8 cm
Single Plane A2C EF: 54.4 %
Single Plane A4C EF: 59.4 %

## 2021-11-19 MED ORDER — METRONIDAZOLE 500 MG PO TABS: 500.0000 mg | ORAL_TABLET | Freq: Two times a day (BID) | ORAL | 0 refills | Status: DC

## 2021-11-19 MED ORDER — NITROFURANTOIN MONOHYD MACRO 100 MG PO CAPS: 100.0000 mg | ORAL_CAPSULE | Freq: Two times a day (BID) | ORAL | 0 refills | Status: DC

## 2021-11-19 MED ORDER — FLUCONAZOLE 150 MG PO TABS: 150.0000 mg | ORAL_TABLET | Freq: Every day | ORAL | 1 refills | Status: AC

## 2021-11-19 NOTE — ED Provider Notes (Signed)
[facility]    CSN: [id number] Arrival date & time: 11/19/21  1539      History   Chief Complaint Chief Complaint  Patient presents with   Vaginal Itching   Dysuria    [name] is a 56 y.o. female presenting for dysuria, urinary frequency, vaginal discharge and itching x3 days.  Denies fever, hematuria, flank pain or concern for STIs.  Reports that she believes she has a UTI.  She has used Monistat over-the-counter and that has helped the vaginal itching.  No other complaints.  HPI  Past Medical History:  Diagnosis Date   Anemia    h/o    Anxiety    Chest pain    a. 05/2018 MV: EF 55-65%, small, mild, fixed apical ant and apical defect, most likely artifact. No ischemia-->Low risk.   Complication of anesthesia    DURING SECTION, BP DROPPED /N/V   COPD (chronic obstructive pulmonary disease) (HCC)    Depression    Dyspnea    DUE TO COPD   Essential hypertension    GERD (gastroesophageal reflux disease)    due to gallbladder   History of echocardiogram    a. 02/2015 Echo: Ef 60-65%. no rwma.   Hyperlipidemia    PONV (postoperative nausea and vomiting)    X1 ONLY DURING C SECTION   PSVT (paroxysmal supraventricular tachycardia)    Tobacco abuse     Patient Active Problem List   Diagnosis Date Noted   Chronic bilateral low back pain without sciatica 03/29/2020   Spondylosis of lumbar region without myelopathy or radiculopathy 03/29/2020   GAD (generalized anxiety disorder) 12/08/2019   Major depressive disorder, recurrent, moderate ([place]) 12/08/2019   Xiphoid pain 12/08/2019   Centrilobular emphysema ([place]) 12/08/2019   Paroxysmal supraventricular tachycardia 02/21/2015   Tobacco use 02/21/2015    Past Surgical History:  Procedure Laterality Date   CESAREAN SECTION     x2   CHOLECYSTECTOMY     MOUTH SURGERY      OB History   No obstetric history on file.      Home Medications    Prior to Admission medications   Medication Sig  Start Date End Date Taking? Authorizing Provider  fluconazole (DIFLUCAN) 150 MG tablet Take 1 tablet (150 mg total) by mouth daily for 1 day. 11/19/21 11/20/21 Yes Laurene Footman B, PA-C  metoprolol tartrate (LOPRESSOR) 25 MG tablet Take 1 tablet (25 mg total) by mouth 2 (two) times daily. 10/10/21  Yes Furth, Cadence H, PA-C  metroNIDAZOLE (FLAGYL) 500 MG tablet Take 1 tablet (500 mg total) by mouth 2 (two) times daily for 7 days. 11/19/21 11/26/21 Yes Danton Clap, PA-C  nitrofurantoin, macrocrystal-monohydrate, (MACROBID) 100 MG capsule Take 1 capsule (100 mg total) by mouth 2 (two) times daily for 5 days. 11/19/21 11/24/21 Yes Danton Clap, PA-C  albuterol (VENTOLIN HFA) 108 (90 Base) MCG/ACT inhaler Inhale 2 puffs into the lungs every 6 (six) hours as needed for wheezing or shortness of breath. Patient not taking: Reported on 10/10/2021 12/08/19 01/30/21  Olin Hauser, DO  FLUoxetine (PROZAC) 10 MG tablet Take 10 mg by mouth daily. Patient not taking: Reported on 10/10/2021 01/30/20   [provider]  fluticasone furoate-vilanterol (BREO ELLIPTA) 200-25 MCG/INH AEPB Inhale 1 puff into the lungs daily. Patient not taking: Reported on 10/10/2021    [provider]    Family History Family History  Family history unknown: Yes    Social History Social History  Tobacco Use   Smoking status: Every Day    Packs/day: 1.50    Years: 40.00    Total pack years: 60.00    Types: Cigarettes   Smokeless tobacco: Never   Tobacco comments:    1ppd as ot 05/24/20  //ep  Vaping Use   Vaping Use: Never used  Substance Use Topics   Alcohol use: Not Currently   Drug use: Not Currently    Comment: LAST USED WHILE IN HER 20'S     Allergies   Codeine   Review of Systems Review of Systems  Constitutional:  Negative for chills, fatigue and fever.  Gastrointestinal:  Negative for abdominal pain, diarrhea, nausea and vomiting.  Genitourinary:  Positive for dysuria,  frequency, urgency and vaginal discharge. Negative for decreased urine volume, flank pain, hematuria, pelvic pain, vaginal bleeding and vaginal pain.  Musculoskeletal:  Negative for back pain.  Skin:  Negative for rash.     Physical Exam Triage Vital Signs ED Triage Vitals  Enc Vitals Group     BP      Pulse      Resp      Temp      Temp src      SpO2      Weight      Height      Head Circumference      Peak Flow      Pain Score      Pain Loc      Pain Edu?      Excl. in GC?    No data found.  Updated Vital Signs BP 115/69 (BP Location: Right Arm)   Pulse 76   Temp 98.1 F (36.7 C) (Oral)   Resp 16   Ht 5\' 2"  (1.575 m)   Wt 193 lb 2 oz (87.6 kg)   LMP 06/27/2014   SpO2 95%   BMI 35.32 kg/m      Physical Exam Vitals and nursing note reviewed.  Constitutional:      General: She is not in acute distress.    Appearance: Normal appearance. She is not ill-appearing or toxic-appearing.  HENT:     Head: Normocephalic and atraumatic.  Eyes:     General: No scleral icterus.       Right eye: No discharge.        Left eye: No discharge.     Conjunctiva/sclera: Conjunctivae normal.  Cardiovascular:     Rate and Rhythm: Normal rate and regular rhythm.     Heart sounds: Normal heart sounds.  Pulmonary:     Effort: Pulmonary effort is normal. No respiratory distress.     Breath sounds: Normal breath sounds.  Abdominal:     Palpations: Abdomen is soft.     Tenderness: There is no abdominal tenderness. There is no right CVA tenderness or left CVA tenderness.  Musculoskeletal:     Cervical back: Neck supple.  Skin:    General: Skin is dry.  Neurological:     General: No focal deficit present.     Mental Status: She is alert. Mental status is at baseline.     Motor: No weakness.     Gait: Gait normal.  Psychiatric:        Mood and Affect: Mood normal.        Behavior: Behavior normal.        Thought Content: Thought content normal.      UC Treatments /  Results  Labs (all labs ordered are listed,  but only abnormal results are displayed) Labs Reviewed  WET PREP, GENITAL - Abnormal; Notable for the following components:      Result Value   Yeast Wet Prep HPF POC PRESENT (*)    Clue Cells Wet Prep HPF POC PRESENT (*)    All other components within normal limits  URINALYSIS, ROUTINE W REFLEX MICROSCOPIC - Abnormal; Notable for the following components:   APPearance HAZY (*)    Hgb urine dipstick MODERATE (*)    Leukocytes,Ua SMALL (*)    All other components within normal limits  URINALYSIS, MICROSCOPIC (REFLEX) - Abnormal; Notable for the following components:   Bacteria, UA FEW (*)    All other components within normal limits  URINE CULTURE    EKG   Radiology  Procedures Procedures (including critical care time)  Medications Ordered in UC Medications - No data to display  Initial Impression / Assessment and Plan / UC Course  I have reviewed the triage vital signs and the nursing notes.  Pertinent labs & imaging results that were available during my care of the patient were reviewed by me and considered in my medical decision making (see chart for details).   56 year old female presents for dysuria, urinary frequency/urgency, vaginal discharge and itching for the past 3 days.  Urinalysis shows hazy urine with moderate hemoglobin and small leukocytes.  We will send urine for culture but suspect UTI.  Will treat with Macrobid.  Wet prep performed by patient with vaginal self swab shows positive yeast and clue cells.  Will treat with Diflucan and metronidazole.  Reviewed good hygiene.  Reviewed return and ER precautions.   Final Clinical Impressions(s) / UC Diagnoses   Final diagnoses:  Acute vaginitis  Acute cystitis without hematuria     Discharge Instructions      The most common types of vaginal infections are yeast infections and bacterial vaginosis. Neither of which are really considered to be sexually  transmitted. Often a pH swab or wet prep is performed and if abnormal may reveal either type of infection. Begin metronidazole if prescribed for possible BV infection. If there is concern for yeast infection, fluconazole is often prescribed . Take this as directed. You may also apply topical miconazole (can be purchased OTC) externally for relief of itching. Increase rest and fluid intake. If labs sent out, we will call within 2-5 days with results and amend treatment if necessary. Always try to use pH balanced washes/wipes, urinate after intercourse, stay hydrated, and take probiotics if you are prone to vaginal infections. Return or see PCP or gynecologist for new/worsening infections.    UTI: Based on either symptoms or urinalysis, you may have a urinary tract infection. We will send the urine for culture and call with results in a few days. Begin antibiotics at this time. Your symptoms should be much improved over the next 2-3 days. Increase rest and fluid intake. If for some reason symptoms are worsening or not improving after a couple of days or the urine culture determines the antibiotics you are taking will not treat the infection, the antibiotics may be changed. Return or go to ER for fever, back pain, worsening urinary pain, discharge, increased blood in urine. May take Tylenol or Motrin OTC for pain relief or consider AZO if no contraindications      ED Prescriptions     Medication Sig Dispense Auth. Provider   metroNIDAZOLE (FLAGYL) 500 MG tablet Take 1 tablet (500 mg total) by mouth 2 (two) times daily for  7 days. 14 tablet Eusebio Friendly B, PA-C   nitrofurantoin, macrocrystal-monohydrate, (MACROBID) 100 MG capsule Take 1 capsule (100 mg total) by mouth 2 (two) times daily for 5 days. 10 capsule Eusebio Friendly B, PA-C   fluconazole (DIFLUCAN) 150 MG tablet Take 1 tablet (150 mg total) by mouth daily for 1 day. 1 tablet Gareth Morgan      PDMP not reviewed this encounter.   Shirlee Latch, PA-C 11/19/21 1725

## 2021-11-19 NOTE — Telephone Encounter (Signed)
  Chief Complaint: hand and finger spasms  Symptoms: pain like a cramp that occurs with spasms, occur mainly when she is using her hands, comes and goes- episodes last up to 2 minutes, right elbow pain Frequency: months but has worsened in the past 6 weeks Pertinent Negatives: Patient denies neck pain, swelling rash numbness fever Disposition: [] ED /[] Urgent Care (no appt availability in office) / [x] Appointment(In office/virtual)/ []  [facility]/ [] Home Care/ [] Refused Recommended Disposition /[] [facility]/ []  Follow-up with PCP Additional Notes: Spoke with AShley about pt not being able to do any type of copay- Caryl Pina approved 1 no co pay. Advised pt to look into [facility]. Caryl Pina mentioned a possible social work referral. Reason for Disposition  Hand or wrist pain is a chronic symptom (recurrent or ongoing AND present > 4 weeks)  Answer Assessment - Initial Assessment Questions 1. ONSET: "When did the pain start?"     months 2. LOCATION: "Where is the pain located?"     Has had it in both then cramps 2 minutes 3. PAIN: "How bad is the pain?" (Scale 1-10; or mild, moderate, severe)   - MILD (1-3): doesn't interfere with normal activities   - MODERATE (4-7): interferes with normal activities (e.g., work or school) or awakens from sleep   - SEVERE (8-10): excruciating pain, unable to use hand at all     Severe when hand  spasms occur 4. WORK OR EXERCISE: "Has there been any recent work or exercise that involved this part (i.e., hand or wrist) of the body?"     Spasms usually occur when pt is using hands 5. CAUSE: "What do you think is causing the pain?"     Mainly when using hands 6. AGGRAVATING FACTORS: "What makes the pain worse?" (e.g., using computer)     Using hands 7. OTHER SYMPTOMS: "Do you have any other symptoms?" (e.g., neck pain, swelling, rash, numbness, fever)     No- when spasms occur- involves fingers or part of hand. Unable to move hand in  correct position until spasm subsides 8. PREGNANCY: "Is there any chance you are pregnant?" "When was your last menstrual period?"     N/a  Protocols used: Hand and Wrist Pain-A-AH

## 2021-11-19 NOTE — Discharge Instructions (Addendum)
The most common types of vaginal infections are yeast infections and bacterial vaginosis. Neither of which are really considered to be sexually transmitted. Often a pH swab or wet prep is performed and if abnormal may reveal either type of infection. Begin metronidazole if prescribed for possible BV infection. If there is concern for yeast infection, fluconazole is often prescribed . Take this as directed. You may also apply topical miconazole (can be purchased OTC) externally for relief of itching. Increase rest and fluid intake. If labs sent out, we will call within 2-5 days with results and amend treatment if necessary. Always try to use pH balanced washes/wipes, urinate after intercourse, stay hydrated, and take probiotics if you are prone to vaginal infections. Return or see PCP or gynecologist for new/worsening infections.    UTI: Based on either symptoms or urinalysis, you may have a urinary tract infection. We will send the urine for culture and call with results in a few days. Begin antibiotics at this time. Your symptoms should be much improved over the next 2-3 days. Increase rest and fluid intake. If for some reason symptoms are worsening or not improving after a couple of days or the urine culture determines the antibiotics you are taking will not treat the infection, the antibiotics may be changed. Return or go to ER for fever, back pain, worsening urinary pain, discharge, increased blood in urine. May take Tylenol or Motrin OTC for pain relief or consider AZO if no contraindications

## 2021-11-19 NOTE — ED Notes (Signed)
Pt ambulatory to desk stating she spoke with her PCP and they told her that she should be able to be seen at urgent care quicker and she wishes to leave. Pt advised to return if sx's worsen.

## 2021-11-19 NOTE — ED Triage Notes (Signed)
Pt c/o dysuria, and vaginal itching. Started about 3 days ago. She states she had lower back pain earlier but has been better.

## 2021-11-21 ENCOUNTER — Encounter: Payer: Self-pay | Admitting: Family Medicine

## 2021-11-21 ENCOUNTER — Ambulatory Visit (INDEPENDENT_AMBULATORY_CARE_PROVIDER_SITE_OTHER): Payer: Self-pay | Admitting: Family Medicine

## 2021-11-21 VITALS — BP 130/74 | HR 92 | Ht 62.0 in | Wt 193.0 lb

## 2021-11-21 DIAGNOSIS — G8929 Other chronic pain: Secondary | ICD-10-CM

## 2021-11-21 DIAGNOSIS — J410 Simple chronic bronchitis: Secondary | ICD-10-CM

## 2021-11-21 DIAGNOSIS — M47816 Spondylosis without myelopathy or radiculopathy, lumbar region: Secondary | ICD-10-CM

## 2021-11-21 DIAGNOSIS — G5603 Carpal tunnel syndrome, bilateral upper limbs: Secondary | ICD-10-CM | POA: Insufficient documentation

## 2021-11-21 DIAGNOSIS — M545 Low back pain, unspecified: Secondary | ICD-10-CM

## 2021-11-21 LAB — URINE CULTURE: Culture: >=100000 — AB

## 2021-11-21 MED ORDER — ALBUTEROL SULFATE HFA 108 (90 BASE) MCG/ACT IN AERS: 2.0000 | INHALATION_SPRAY | RESPIRATORY_TRACT | 3 refills | Status: DC | PRN

## 2021-11-21 MED ORDER — IBUPROFEN 800 MG PO TABS: 800.0000 mg | ORAL_TABLET | Freq: Three times a day (TID) | ORAL | 3 refills | Status: DC | PRN

## 2021-11-21 MED ORDER — GABAPENTIN 100 MG PO CAPS: ORAL_CAPSULE | ORAL | 3 refills | Status: DC

## 2021-11-21 MED ORDER — FLUTICASONE-SALMETEROL 250-50 MCG/ACT IN AEPB: 1.0000 | INHALATION_SPRAY | Freq: Two times a day (BID) | RESPIRATORY_TRACT | 2 refills | Status: DC

## 2021-11-21 NOTE — Progress Notes (Signed)
Subjective:    Patient ID: Tanya Hardin, female    DOB: January 06, 1966, 56 y.o.   MRN: [id number]  Tanya Hardin is a 56 y.o. female presenting on 11/21/2021 for Hand Pain   HPI  Osteoarthritis Multiple Joints Chronic Back, Hip, Hand / Wrist Pain History of [place] disease of R wrist Carpal Tunnel Syndrome She has seen specialists before including Neurologist in [facility] R wrist, already loss of range of motion - Osteoarthritis multiple joints, previously in back. Impacting her mobility. - Right elbow tendonitis recurrent pain. - Carpal Tunnel bilateral wrists - Reduced range of motion hips, back.  Difficulty with using hands repetitive tasks for work Failed Gabapentin to R wrist pain due to oversedation in afternoon when took dosing Takes NSAID AS NEEDED Declines surgery or procedure   Chronic Bronchitis / COPD Previously pulmonology work up, can not afford inhalers, needs new rx. Prior PFT showed chronic bronchitis.       12/08/2019   10:32 AM  Depression screen PHQ 2/9  Decreased Interest 1  Down, Depressed, Hopeless 1  PHQ - 2 Score 2  Altered sleeping 3  Tired, decreased energy 3  Change in appetite 1  Feeling bad or failure about yourself  3  Trouble concentrating 3  Moving slowly or fidgety/restless 1  Suicidal thoughts 0  PHQ-9 Score 16  Difficult doing work/chores Very difficult    Social History   Tobacco Use   Smoking status: Every Day    Packs/day: 1.50    Years: 40.00    Total pack years: 60.00    Types: Cigarettes   Smokeless tobacco: Never   Tobacco comments:    1ppd as ot 05/24/20  //ep  Vaping Use   Vaping Use: Never used  Substance Use Topics   Alcohol use: Not Currently   Drug use: Not Currently    Comment: LAST USED WHILE IN HER 20'S    Review of Systems Per HPI unless specifically indicated above     Objective:    BP 130/74   Pulse 92   Ht 5\' 2"  (1.575 m)   Wt 193 lb (87.5 kg)   LMP 06/27/2014   SpO2 100%   BMI  35.30 kg/m   Wt Readings from Last 3 Encounters:  11/21/21 193 lb (87.5 kg)  11/19/21 193 lb 2 oz (87.6 kg)  10/10/21 193 lb 3.2 oz (87.6 kg)    Physical Exam Vitals and nursing note reviewed.  Constitutional:      General: She is not in acute distress.    Appearance: She is well-developed. She is not diaphoretic.     Comments: Well-appearing, comfortable, cooperative  HENT:     Head: Normocephalic and atraumatic.  Eyes:     General:        Right eye: No discharge.        Left eye: No discharge.     Conjunctiva/sclera: Conjunctivae normal.  Neck:     Thyroid: No thyromegaly.  Cardiovascular:     Rate and Rhythm: Normal rate and regular rhythm.     Heart sounds: Normal heart sounds. No murmur heard. Pulmonary:     Effort: Pulmonary effort is normal. No respiratory distress.     Breath sounds: Normal breath sounds. No wheezing or rales.  Musculoskeletal:        General: Normal range of motion.     Cervical back: Normal range of motion and neck supple.  Lymphadenopathy:     Cervical: No cervical adenopathy.  Skin:    General: Skin is warm and dry.     Findings: No erythema or rash.  Neurological:     Mental Status: She is alert and oriented to person, place, and time.  Psychiatric:        Behavior: Behavior normal.     Comments: Well groomed, good eye contact, normal speech and thoughts      Results for orders placed or performed during the hospital encounter of 11/19/21  Wet prep, genital   Specimen: Urine, Clean Catch  Result Value Ref Range   Yeast Wet Prep HPF POC PRESENT (A) NONE SEEN   Trich, Wet Prep NONE SEEN NONE SEEN   Clue Cells Wet Prep HPF POC PRESENT (A) NONE SEEN   WBC, Wet Prep HPF POC <10 <10   Sperm NONE SEEN   Urine Culture   Specimen: Urine, Clean Catch  Result Value Ref Range   Specimen Description      URINE, CLEAN CATCH Performed at [facility], [address] [place], [place] [address]    Special Requests       NONE Performed at [facility], [address] [place], [place] [address]    Culture >=100,000 COLONIES/mL ESCHERICHIA COLI (A)    Report Status 11/21/2021 FINAL    Organism ID, Bacteria ESCHERICHIA COLI (A)       Susceptibility   Escherichia coli - MIC*    AMPICILLIN >=32 RESISTANT Resistant     CEFAZOLIN <=4 SENSITIVE Sensitive     CEFEPIME <=0.12 SENSITIVE Sensitive     CEFTRIAXONE <=0.25 SENSITIVE Sensitive     CIPROFLOXACIN <=0.25 SENSITIVE Sensitive     GENTAMICIN <=1 SENSITIVE Sensitive     IMIPENEM <=0.25 SENSITIVE Sensitive     NITROFURANTOIN <=16 SENSITIVE Sensitive     TRIMETH/SULFA >=320 RESISTANT Resistant     AMPICILLIN/SULBACTAM >=32 RESISTANT Resistant     PIP/TAZO <=4 SENSITIVE Sensitive     * >=100,000 COLONIES/mL ESCHERICHIA COLI  Urinalysis, Routine w reflex microscopic Urine, Clean Catch  Result Value Ref Range   Color, Urine YELLOW YELLOW   APPearance HAZY (A) CLEAR   Specific Gravity, Urine 1.010 1.005 - 1.030   pH 5.5 5.0 - 8.0   Glucose, UA NEGATIVE NEGATIVE mg/dL   Hgb urine dipstick MODERATE (A) NEGATIVE   Bilirubin Urine NEGATIVE NEGATIVE   Ketones, ur NEGATIVE NEGATIVE mg/dL   Protein, ur NEGATIVE NEGATIVE mg/dL   Nitrite NEGATIVE NEGATIVE   Leukocytes,Ua SMALL (A) NEGATIVE  Urinalysis, Microscopic (reflex)  Result Value Ref Range   RBC / HPF 11-20 0 - 5 RBC/hpf   WBC, UA 21-50 0 - 5 WBC/hpf   Bacteria, UA FEW (A) NONE SEEN   Squamous Epithelial / LPF 0-5 0 - 5   WBC Clumps PRESENT    Budding Yeast PRESENT       Assessment & Plan:   Problem List Items Addressed This Visit     Bilateral carpal tunnel syndrome - Primary   Relevant Medications   ibuprofen (ADVIL) 800 MG tablet   gabapentin (NEURONTIN) 100 MG capsule   Centrilobular emphysema (HCC)   Relevant Medications   albuterol (VENTOLIN HFA) 108 (90 Base) MCG/ACT inhaler   fluticasone-salmeterol (ADVAIR DISKUS) 250-50 MCG/ACT AEPB   Chronic bilateral low back pain  without sciatica   Relevant Medications   ibuprofen (ADVIL) 800 MG tablet   gabapentin (NEURONTIN) 100 MG capsule   Spondylosis of lumbar region without myelopathy or radiculopathy   Relevant Medications  ibuprofen (ADVIL) 800 MG tablet   gabapentin (NEURONTIN) 100 MG capsule    Osteoarthritis multiple joints Carpal Tunnel Wrists Muscle spasm stiffness Back Pain, HIp Pain with arthritis degenerative  Not interested in procedures or surgery or specialist at this time.  Discussion today on management of MSK etiology, unfortunately due to repetitive work and strain, likely major contributing factor. Discussed activity modification, symptom management conservatively and future adjust meds, and eventually if need would consider return to specialist for procedural intervention  Use ibuprofen 800mg  with meal 3 times a day for 1-2 weeks then use it as needed, caution with too much use. As discussed intermittent treatment of flares.  To avoid prior side effect too strong gabapentin during day, will slowly adjust dose only at night  Start Gabapentin 100mg  capsules, take at night for 1 week nights only, and then increase to 2 and eventually 3 at night, increase slowly  COPD Chronic Bronchitis Reviewed prior CT and Lung evaluation PFTs  Limited by lack of insurance Sample [place] today  Written rx Advair - use goodrx Albuterol AS NEEDED Smoking cessation    Meds ordered this encounter  Medications   ibuprofen (ADVIL) 800 MG tablet    Sig: Take 1 tablet (800 mg total) by mouth every 8 (eight) hours as needed for moderate pain.    Dispense:  90 tablet    Refill:  3   gabapentin (NEURONTIN) 100 MG capsule    Sig: Start 1 capsule daily at bedtime, may increase by 1 additional cap every week as needed up to max of 3 at bedtime    Dispense:  90 capsule    Refill:  3   albuterol (VENTOLIN HFA) 108 (90 Base) MCG/ACT inhaler    Sig: Inhale 2 puffs into the lungs every 4 (four) hours as  needed for wheezing or shortness of breath.    Dispense:  8 g    Refill:  3   fluticasone-salmeterol (ADVAIR DISKUS) 250-50 MCG/ACT AEPB    Sig: Inhale 1 puff into the lungs in the morning and at bedtime.    Dispense:  60 each    Refill:  2      Follow up plan: Return in about 3 months (around 02/21/2022) for 3 month arthritis joint pain / COPD.    Nobie Putnam, DO [facility] 11/21/2021, 2:13 PM

## 2021-11-21 NOTE — Patient Instructions (Addendum)
Thank you for coming to the office today.  Use ibuprofen with meal 3 times a day for 1-2 weeks then use it as needed, caution with too much use.  Start Gabapentin 100mg  capsules, take at night for 1 week nights only, and then increase to 2 and eventually 3 at night, increase slowly  Other daily inhalers, please check cost coverage and goodrx  Spiriva Breo Advair Breztri Trelegy   Please schedule a Follow-up Appointment to: Return in about 3 months (around 02/21/2022) for 3 month arthritis joint pain / COPD.  If you have any other questions or concerns, please feel free to call the office or send a message through [place]. You may also schedule an earlier appointment if necessary.  Additionally, you may be receiving a survey about your experience at our office within a few days to 1 week by e-mail or mail. We value your feedback.  Nobie Putnam, DO [place]

## 2021-12-16 ENCOUNTER — Other Ambulatory Visit: Payer: Self-pay

## 2021-12-16 ENCOUNTER — Emergency Department
Admission: EM | Admit: 2021-12-16 | Discharge: 2021-12-16 | Disposition: A | Discharge status: Home / Self Care | Payer: Self-pay | Attending: Student in an Organized Health Care Education/Training Program | Admitting: Student in an Organized Health Care Education/Training Program

## 2021-12-16 ENCOUNTER — Encounter: Payer: Self-pay | Admitting: Emergency Medicine

## 2021-12-16 ENCOUNTER — Emergency Department: Payer: Self-pay

## 2021-12-16 ENCOUNTER — Ambulatory Visit
Admission: EM | Admit: 2021-12-16 | Discharge: 2021-12-16 | Disposition: A | Discharge status: Home / Self Care | Payer: Self-pay | Attending: Emergency Medicine | Admitting: Emergency Medicine

## 2021-12-16 DIAGNOSIS — M79605 Pain in left leg: Secondary | ICD-10-CM | POA: Insufficient documentation

## 2021-12-16 DIAGNOSIS — F17200 Nicotine dependence, unspecified, uncomplicated: Secondary | ICD-10-CM | POA: Insufficient documentation

## 2021-12-16 NOTE — Discharge Instructions (Signed)
Ultrasound did not reveal any DVT.  Continue to rest, ice, elevate this area also.  You may follow-up with orthopedics.  Please return for any new, worsening, or change in symptoms or other concerns.  It was a pleasure caring for you today.

## 2021-12-16 NOTE — ED Triage Notes (Signed)
Pt to ED from home c/o left posterior leg pain since yesterday.  Denies swelling or new SOB.  Seen at urgent care and told to come to ED to rule out blood clot.  No blood thinners.

## 2021-12-16 NOTE — ED Provider Notes (Signed)
[facility] Provider Note    Event Date/Time   First MD Initiated Contact with Patient 12/16/21 2032     (approximate)   History   Leg Pain   HPI  Tanya Hardin is a 56 y.o. female with past medical history of generalized anxiety disorder, depression, chronic bilateral low back pain who presents today for evaluation of left posterior leg pain that has been intermittent for the past 1 year.  She reports that some days she has the pain as sometimes she does not.  She denies any radiation of her pain.  She has not noticed any swelling of her leg or any skin changes.  No chest pain or shortness of breath.  She went to the urgent care who sent her to the emergency department for ultrasound to evaluate for DVT.  Patient denies history of PE or DVT.  She does not take any exogenous hormones.  She denies any recent travel or periods of immobilization.  She has not been wearing any splints.  Patient Active Problem List   Diagnosis Date Noted   Bilateral carpal tunnel syndrome 11/21/2021   Chronic bilateral low back pain without sciatica 03/29/2020   Spondylosis of lumbar region without myelopathy or radiculopathy 03/29/2020   GAD (generalized anxiety disorder) 12/08/2019   Major depressive disorder, recurrent, moderate (HCC) 12/08/2019   Xiphoid pain 12/08/2019   Centrilobular emphysema (HCC) 12/08/2019   Paroxysmal supraventricular tachycardia 02/21/2015   Tobacco use 02/21/2015          Physical Exam   Triage Vital Signs: ED Triage Vitals [12/16/21 2024]  Enc Vitals Group     BP 119/66     Pulse Rate 80     Resp 16     Temp 98 F (36.7 C)     Temp Source Oral     SpO2 96 %     Weight 200 lb (90.7 kg)     Height 5\' 2"  (1.575 m)     Head Circumference      Peak Flow      Pain Score 5     Pain Loc      Pain Edu?      Excl. in GC?     Most recent vital signs: Vitals:   12/16/21 2024  BP: 119/66  Pulse: 80  Resp: 16  Temp: 98 F (36.7 C)   SpO2: 96%    Physical Exam Vitals and nursing note reviewed.  Constitutional:      General: Awake and alert. No acute distress.    Appearance: Normal appearance. The patient is normal weight.  HENT:     Head: Normocephalic and atraumatic.     Mouth: Mucous membranes are moist.  Eyes:     General: PERRL. Normal EOMs        Right eye: No discharge.        Left eye: No discharge.     Conjunctiva/sclera: Conjunctivae normal.  Cardiovascular:     Rate and Rhythm: Normal rate and regular rhythm.     Pulses: Normal pulses.  Pulmonary:     Effort: Pulmonary effort is normal. No respiratory distress.     Breath sounds: Normal breath sounds.  Abdominal:     Abdomen is soft. There is no abdominal tenderness. No rebound or guarding. No distention. Musculoskeletal:        General: No swelling. Normal range of motion.     Cervical back: Normal range of motion and neck supple.  Minimal  tenderness palpation to left proximal posterior calf without swelling or varicosities noted.  No pitting edema.  No erythema or ecchymosis noted.  Normal range of motion of hip, knee, ankle.  Normal 2+ distal pulses.  No wounds noted.  Sensation intact light touch throughout lower extremity. Compartments are soft and compressible throughout.  Skin:    General: Skin is warm and dry.     Capillary Refill: Capillary refill takes less than 2 seconds.     Findings: No rash.  Neurological:     Mental Status: The patient is awake and alert.      ED Results / Procedures / Treatments   Labs (all labs ordered are listed, but only abnormal results are displayed) Labs Reviewed - No data to display   EKG     RADIOLOGY I independently reviewed and interpreted imaging and agree with radiologists findings.     PROCEDURES:  Critical Care performed:   Procedures   MEDICATIONS ORDERED IN ED: Medications - No data to display   IMPRESSION / MDM / ASSESSMENT AND PLAN / ED COURSE  I reviewed the triage  vital signs and the nursing notes.   Differential diagnosis includes, but is not limited to, DVT, Baker's cyst, musculoskeletal strain.  Patient is awake and alert, nontoxic in appearance.  She is got reproducible tenderness palpation to her proximal posterior calf without overlying skin color changes or pitting edema.  Ultrasound obtained in triage is negative for DVT.  No evidence of Baker's cyst.  I do suspect MSK etiology.  There are no signs or symptoms of infection at this time.  She has full and normal range of motion of her knee.  Compartments are soft and compressible throughout.  Normal distal pulses.  Her symptoms have been ongoing for nearly a year.  I recommended outpatient follow-up.  Also discussed rest, ice, elevation. We also discussed return precautions.  Patient understands and agrees with plan.  Discharged in stable condition.   Patient's presentation is most consistent with acute complicated illness / injury requiring diagnostic workup.     FINAL CLINICAL IMPRESSION(S) / ED DIAGNOSES   Final diagnoses:  Left leg pain     Rx / DC Orders   ED Discharge Orders     None        Note:  This document was prepared using Dragon voice recognition software and may include unintentional dictation errors.   Keturah Shavers 12/16/21 2301    Willy Eddy, MD 12/16/21 [phone]

## 2021-12-16 NOTE — Discharge Instructions (Signed)
This could be muscle spasm, but it could also be a blood clot in your leg.  Please go to the emergency department to have this evaluated.  Unfortunately, we do not have testing or imaging here to rule out a blood clot in your leg.

## 2021-12-16 NOTE — ED Provider Notes (Signed)
HPI  SUBJECTIVE:  Tanya Hardin is a 56 y.o. female who presents with intermittent left popliteal/calf pain starting at 0630 yesterday.  States that it lasts several minutes at a time, and is coming every 10 minutes or so.  She states that it feels sharp, stabbing, as if it is located "in my vein".  No trauma to the area, change in physical activity, swelling behind her knee, calf swelling, distal color or temperature changes, distal numbness or tingling.  She states that she is fairly sedentary, but denies prolonged immobilization.  No pain with dorsiflexion/plantarflexion, flexion extension of her knee.  She tried ibuprofen 800 mg without improvement in her symptoms.  No aggravating factors.  She has a past medical history of COPD, PSVT, hypertension, and is a smoker.  No history of DVT, PE, cancer, hypercoagulability, diabetes.  [facility]P: [facility]  Past Medical History:  Diagnosis Date   Anemia    h/o    Anxiety    Chest pain    a. 05/2018 MV: EF 55-65%, small, mild, fixed apical ant and apical defect, most likely artifact. No ischemia-->Low risk.   Complication of anesthesia    DURING SECTION, BP DROPPED /N/V   COPD (chronic obstructive pulmonary disease) (HCC)    Depression    Dyspnea    DUE TO COPD   Essential hypertension    GERD (gastroesophageal reflux disease)    due to gallbladder   History of echocardiogram    a. 02/2015 Echo: Ef 60-65%. no rwma.   Hyperlipidemia    PONV (postoperative nausea and vomiting)    X1 ONLY DURING C SECTION   PSVT (paroxysmal supraventricular tachycardia)    Tobacco abuse     Past Surgical History:  Procedure Laterality Date   CESAREAN SECTION     x2   CHOLECYSTECTOMY     MOUTH SURGERY      Family History  Family history unknown: Yes    Social History   Tobacco Use   Smoking status: Every Day    Packs/day: 1.50    Years: 40.00    Total pack years: 60.00    Types: Cigarettes   Smokeless tobacco: Never   Tobacco comments:     1ppd as ot 05/24/20  //ep  Vaping Use   Vaping Use: Never used  Substance Use Topics   Alcohol use: Not Currently   Drug use: Not Currently    Comment: LAST USED WHILE IN HER 20'S    No current facility-administered medications for this encounter.  Current Outpatient Medications:    albuterol (VENTOLIN HFA) 108 (90 Base) MCG/ACT inhaler, Inhale 2 puffs into the lungs every 4 (four) hours as needed for wheezing or shortness of breath., Disp: 8 g, Rfl: 3   fluticasone-salmeterol (ADVAIR DISKUS) 250-50 MCG/ACT AEPB, Inhale 1 puff into the lungs in the morning and at bedtime., Disp: 60 each, Rfl: 2   gabapentin (NEURONTIN) 100 MG capsule, Start 1 capsule daily at bedtime, may increase by 1 additional cap every week as needed up to max of 3 at bedtime, Disp: 90 capsule, Rfl: 3   ibuprofen (ADVIL) 800 MG tablet, Take 1 tablet (800 mg total) by mouth every 8 (eight) hours as needed for moderate pain., Disp: 90 tablet, Rfl: 3   metoprolol tartrate (LOPRESSOR) 25 MG tablet, Take 1 tablet (25 mg total) by mouth 2 (two) times daily., Disp: 180 tablet, Rfl: 0  Allergies  Allergen Reactions   Codeine Itching, Nausea And Vomiting and Other (See Comments)  PT STATES SHE FELT NAUSEATED AND DID NOT LIKE THE WAY IT MADE HER FEEL     ROS  As noted in HPI.   Physical Exam  BP 124/62 (BP Location: Left Arm)   Pulse 82   Temp 98 F (36.7 C) (Oral)   Resp 18   Ht 5\' 2"  (1.575 m)   Wt 90.7 kg   LMP 06/27/2014   SpO2 97%   BMI 36.58 kg/m   Constitutional: Well developed, well nourished, no acute distress Eyes:  EOMI, conjunctiva normal bilaterally HENT: Normocephalic, atraumatic,mucus membranes moist Respiratory: Normal inspiratory effort Cardiovascular: Normal rate GI: nondistended skin: No rash, skin intact Musculoskeletal: Left calf 38 cm, right calf 37.5 cm.  Left leg: Positive popliteal and upper calf tenderness.  No appreciable cord.  No appreciable field swelling suggestive of a  Baker's cyst.  DP 2+ and equal.  Normal color.  No tenderness along the medial thigh.  No knee tenderness.No pain with dorsiflexion/plantarflexion, flexion extension of her knee Neurologic: Alert & oriented x 3, no focal neuro deficits Psychiatric: Speech and behavior appropriate   ED Course   Medications - No data to display  No orders of the defined types were placed in this encounter.   No results found for this or any previous visit (from the past 24 hour(s)). No results found.  ED Clinical Impression  1. Left leg pain      ED Assessment/Plan     Outside records reviewed.  As noted in HPI.  Differential includes DVT, muscle spasm, muscular strain, Baker's cyst.  Primary concern is DVT.  Discussed with patient that I do not have the means to evaluate for that here.  Advised that she go to the emergency department for further evaluation.  Her vitals are normal, she is not tachycardic or hypoxic.  She is stable to go via private vehicle.  She has agreed to go to [facility].  No orders of the defined types were placed in this encounter.     *This clinic note was created using Dragon dictation software. Therefore, there may be occasional mistakes despite careful proofreading.  ?    [facility], MD 12/16/21 [phone]

## 2021-12-16 NOTE — ED Triage Notes (Signed)
Left leg, deep pain right below knee. Has been going on for a while, off and on, has been constant for 1 day. States it "shoots(stabbing) and then eases up." States when she has sharp pain it also hurts her head.

## 2022-01-23 ENCOUNTER — Ambulatory Visit: Payer: Medicaid Other | Admitting: Medical

## 2022-01-26 ENCOUNTER — Ambulatory Visit: Payer: Medicaid Other | Admitting: Medical

## 2022-01-26 NOTE — Progress Notes (Deleted)
Cardiology Office Note:    Date:  01/26/2022   ID:  Tanya Hardin, DOB [birth year], MRN [id number]  PCP:  Olin Hauser, DO  [facility] Cardiologist:  Kathlyn Sacramento, MD  [place] Electrophysiologist:  None   Referring MD: Nobie Putnam *   Chief Complaint: ***  History of Present Illness:    Tanya Hardin is a 57 y.o. female with a hx of  pSVT, chest pain and tobacco abuse who presents for 2 year follow-up.    PSVT history dates back approximately for 5 years.  Prior echocardiogram in February 2017 showed normal LV function without regional wall motion abnormalities.  She has been managed with beta-blocker therapy, which she is currently taking daily, and has also been evaluated by electrophysiology.  She has not wanted ablation up to this point.  In May 2020, she was experiencing worsening dyspnea.  She underwent stress testing, which was low risk and nonischemic.  There was question of a small, mild, fixed apical anterior and apical defect which was felt to be most likely representative of artifact.   She was las seen September 2023 with issues with her eye, lower leg pain, and atypical chest pain.  Was felt chest pain was worse in the setting of anxiety.  Labs were drawn and an echo was ordered.  ABIs were ordered for her leg pain.  Is recommended she see PCP for noncardiac issues.  Echo November 2023 showed LVEF 60 to 65%, no wall motion abnormalities, normal RV systolic function.  ABIs were within normal range.  Today,   Past Medical History:  Diagnosis Date   Anemia    h/o    Anxiety    Chest pain    a. 05/2018 MV: EF 55-65%, small, mild, fixed apical ant and apical defect, most likely artifact. No ischemia-->Low risk.   Complication of anesthesia    DURING SECTION, BP DROPPED /N/V   COPD (chronic obstructive pulmonary disease) (HCC)    Depression    Dyspnea    DUE TO COPD   Essential hypertension    GERD (gastroesophageal reflux disease)     due to gallbladder   History of echocardiogram    a. 02/2015 Echo: Ef 60-65%. no rwma.   Hyperlipidemia    PONV (postoperative nausea and vomiting)    X1 ONLY DURING C SECTION   PSVT (paroxysmal supraventricular tachycardia)    Tobacco abuse     Past Surgical History:  Procedure Laterality Date   CESAREAN SECTION     x2   CHOLECYSTECTOMY     MOUTH SURGERY      Current Medications: No outpatient medications have been marked as taking for the 01/26/22 encounter (Appointment) with Kathlen Mody, Spenser Harren H, PA-C.     Allergies:   Codeine   Social History   Socioeconomic History   Marital status: Single    Spouse name: Not on file   Number of children: Not on file   Years of education: Not on file   Highest education level: Not on file  Occupational History   Not on file  Tobacco Use   Smoking status: Every Day    Packs/day: 1.50    Years: 40.00    Total pack years: 60.00    Types: Cigarettes   Smokeless tobacco: Never   Tobacco comments:    1ppd as ot 05/24/20  //ep  Vaping Use   Vaping Use: Never used  Substance and Sexual Activity   Alcohol use: Not Currently  Drug use: Not Currently    Comment: LAST USED WHILE IN HER 20'S   Sexual activity: Not on file  Other Topics Concern   Not on file  Social History Narrative   Not on file   Social Determinants of Health   Financial Resource Strain: Not on file  Food Insecurity: Not on file  Transportation Needs: Not on file  Physical Activity: Not on file  Stress: Not on file  Social Connections: Not on file     Family History: The patient's ***Family history is unknown by patient.  ROS:   Please see the history of present illness.    *** All other systems reviewed and are negative.  EKGs/Labs/Other Studies Reviewed:    The following studies were reviewed today: ***  EKG:  EKG is *** ordered today.  The ekg ordered today demonstrates ***  Recent Labs: 10/10/2021: ALT 15; BUN 10; Creatinine, Ser 0.69;  Hemoglobin 13.7; Platelets 225; Potassium 3.9; Sodium 141  Recent Lipid Panel    Component Value Date/Time   CHOL 205 (H) 10/10/2021 1233   TRIG 261 (H) 10/10/2021 1233   HDL 40 (L) 10/10/2021 1233   CHOLHDL 5.1 10/10/2021 1233   VLDL 52 (H) 10/10/2021 1233   LDLCALC 113 (H) 10/10/2021 1233   LDLDIRECT 140 (H) 10/10/2021 1233     Risk Assessment/Calculations:   {Does this patient have ATRIAL FIBRILLATION?[phone]}   Physical Exam:    VS:  LMP 06/27/2014     Wt Readings from Last 3 Encounters:  12/16/21 200 lb (90.7 kg)  12/16/21 200 lb (90.7 kg)  11/21/21 193 lb (87.5 kg)     GEN: *** Well nourished, well developed in no acute distress HEENT: Normal NECK: No JVD; No carotid bruits LYMPHATICS: No lymphadenopathy CARDIAC: ***RRR, no murmurs, rubs, gallops RESPIRATORY:  Clear to auscultation without rales, wheezing or rhonchi  ABDOMEN: Soft, non-tender, non-distended MUSCULOSKELETAL:  No edema; No deformity  SKIN: Warm and dry NEUROLOGIC:  Alert and oriented x 3 PSYCHIATRIC:  Normal affect   ASSESSMENT:    No diagnosis found. PLAN:    In order of problems listed above:  ***  Disposition: Follow up {follow up:15908} with ***   Shared Decision Making/Informed Consent   {Are you ordering a CV Procedure (e.g. stress test, cath, DCCV, TEE, etc)?   Press F2        :[id number]}    Signed, Marrion Finan David Stall, PA-C  01/26/2022 8:02 AM    [facility]

## 2022-01-29 ENCOUNTER — Ambulatory Visit: Payer: Medicaid Other | Admitting: Nurse Practitioner

## 2022-01-29 ENCOUNTER — Encounter: Payer: Self-pay | Admitting: Nurse Practitioner

## 2022-01-29 NOTE — Progress Notes (Deleted)
Office Visit    Patient Name: Tanya Hardin Date of Encounter: 01/29/2022  Primary Care Provider:  Olin Hauser, DO Primary Cardiologist:  Tanya Sacramento, MD  Chief Complaint    57 y/o ? w/ a h/o PSVT, chest pain with previous nonischemic stress testing in May 2020, Ao atherosclerosis, and tobacco abuse, presents for follow-up related to ***  Past Medical History    Past Medical History:  Diagnosis Date   Anemia    h/o    Anxiety    Aortic atherosclerosis ([place])    a. 01/2020 noted on high res chest CT.   Chest pain    a. 05/2018 MV: EF 55-65%, small, mild, fixed apical ant and apical defect, most likely artifact. No ischemia-->Low risk.   Complication of anesthesia    DURING SECTION, BP DROPPED /N/V   COPD (chronic obstructive pulmonary disease) (HCC)    Depression    Dyspnea    DUE TO COPD   Essential hypertension    GERD (gastroesophageal reflux disease)    due to gallbladder   History of echocardiogram    a. 02/2015 Echo: Ef 60-65%. no rwma; b. 11/2021 Echo: EF 60-65%, no rwma, nl RV fxn, triv MR.   Hyperlipidemia    PONV (postoperative nausea and vomiting)    X1 ONLY DURING C SECTION   PSVT (paroxysmal supraventricular tachycardia)    Tobacco abuse    Past Surgical History:  Procedure Laterality Date   CESAREAN SECTION     x2   CHOLECYSTECTOMY     MOUTH SURGERY      Allergies  Allergies  Allergen Reactions   Codeine Itching, Nausea And Vomiting and Other (See Comments)    PT STATES SHE FELT NAUSEATED AND DID NOT LIKE THE WAY IT MADE HER FEEL    History of Present Illness    57 year old female with the above past medical history including paroxysmal supraventricular tachycardia, chest pain, Ao atherosclerosis, and tobacco use.  PSVT history dates back approximately 7 years.  Prior echo November 2017 showed normal LV function without regional wall motion abnormalities.  She was managed with beta-blocker therapy and evaluated by  electrophysiology.  She was not interested in ablation previously.  In May 2020, she complained of worsening dyspnea.  Stress testing was performed and was low risk/nonischemic.  There was question of a small, mild, fixed apical anterior and apical defect, which was felt to be artifact.  Tanya Hardin was last seen in clinic in September 2023, at which time she reported left lower leg pain and bilateral foot pain.  She reported occasional chest pain at the end of the day, usually associated with stress and anxiety.  She was still smoking heavily.  An echocardiogram was performed and showed normal LV and RV function with trivial mitral regurgitation.  She was seen in the emergency department in late November in the setting of ongoing left leg pain.  Lower extremity venous duplex was negative for DVT or Baker's cyst.  Symptoms were felt to be musculoskeletal and she was discharged home.  Home Medications    Current Outpatient Medications  Medication Sig Dispense Refill   albuterol (VENTOLIN HFA) 108 (90 Base) MCG/ACT inhaler Inhale 2 puffs into the lungs every 4 (four) hours as needed for wheezing or shortness of breath. 8 g 3   fluticasone-salmeterol (ADVAIR DISKUS) 250-50 MCG/ACT AEPB Inhale 1 puff into the lungs in the morning and at bedtime. 60 each 2   gabapentin (NEURONTIN) 100 MG capsule  Start 1 capsule daily at bedtime, may increase by 1 additional cap every week as needed up to max of 3 at bedtime 90 capsule 3   ibuprofen (ADVIL) 800 MG tablet Take 1 tablet (800 mg total) by mouth every 8 (eight) hours as needed for moderate pain. 90 tablet 3   metoprolol tartrate (LOPRESSOR) 25 MG tablet Take 1 tablet (25 mg total) by mouth 2 (two) times daily. 180 tablet 0   No current facility-administered medications for this visit.     Review of Systems    ***.  All other systems reviewed and are otherwise negative except as noted above.    Physical Exam    VS:  LMP 06/27/2014  , BMI There is no  height or weight on file to calculate BMI.     GEN: Well nourished, well developed, in no acute distress. HEENT: normal. Neck: Supple, no JVD, carotid bruits, or masses. Cardiac: RRR, no murmurs, rubs, or gallops. No clubbing, cyanosis, edema.  Radials 2+/PT 2+ and equal bilaterally.  Respiratory:  Respirations regular and unlabored, clear to auscultation bilaterally. GI: Soft, nontender, nondistended, BS + x 4. MS: no deformity or atrophy. Skin: warm and dry, no rash. Neuro:  Strength and sensation are intact. Psych: Normal affect.  Accessory Clinical Findings    ECG personally reviewed by me today - *** - no acute changes.  Lab Results  Component Value Date   WBC 7.1 10/10/2021   HGB 13.7 10/10/2021   HCT 39.3 10/10/2021   MCV 86.2 10/10/2021   PLT 225 10/10/2021   Lab Results  Component Value Date   CREATININE 0.69 10/10/2021   BUN 10 10/10/2021   NA 141 10/10/2021   K 3.9 10/10/2021   CL 110 10/10/2021   CO2 24 10/10/2021   Lab Results  Component Value Date   ALT 15 10/10/2021   AST 17 10/10/2021   ALKPHOS 67 10/10/2021   BILITOT 0.3 10/10/2021   Lab Results  Component Value Date   CHOL 205 (H) 10/10/2021   HDL 40 (L) 10/10/2021   LDLCALC 113 (H) 10/10/2021   LDLDIRECT 140 (H) 10/10/2021   TRIG 261 (H) 10/10/2021   CHOLHDL 5.1 10/10/2021    Lab Results  Component Value Date   HGBA1C 5.5 04/01/2020    Assessment & Plan    1.  ***   Tanya Hodgkins, NP 01/29/2022, 7:39 AM

## 2022-01-30 ENCOUNTER — Ambulatory Visit: Payer: Self-pay | Admitting: Internal Medicine

## 2022-02-02 ENCOUNTER — Telehealth: Payer: Self-pay | Admitting: Family Medicine

## 2022-02-02 ENCOUNTER — Other Ambulatory Visit: Payer: Self-pay | Admitting: Family Medicine

## 2022-02-02 DIAGNOSIS — J410 Simple chronic bronchitis: Secondary | ICD-10-CM

## 2022-02-02 DIAGNOSIS — M47816 Spondylosis without myelopathy or radiculopathy, lumbar region: Secondary | ICD-10-CM

## 2022-02-02 DIAGNOSIS — G8929 Other chronic pain: Secondary | ICD-10-CM

## 2022-02-02 DIAGNOSIS — G5603 Carpal tunnel syndrome, bilateral upper limbs: Secondary | ICD-10-CM

## 2022-02-02 MED ORDER — BREZTRI AEROSPHERE 160-9-4.8 MCG/ACT IN AERO: 2.0000 | INHALATION_SPRAY | Freq: Two times a day (BID) | RESPIRATORY_TRACT | 11 refills | Status: DC

## 2022-02-02 NOTE — Telephone Encounter (Signed)
Medication Refill - Medication: albuterol (VENTOLIN HFA) 108 (90 Base) MCG/ACT inhaler [[id number]]   gabapentin (NEURONTIN) 100 MG capsule [[id number]]   ibuprofen (ADVIL) 800 MG tablet [[id number]]   Has the patient contacted their pharmacy? Yes.   (Agent: If no, request that the patient contact the pharmacy for the refill. If patient does not wish to contact the pharmacy document the reason why and proceed with request.) (Agent: If yes, when and what did the pharmacy advise?)  Preferred Pharmacy (with phone number or street name):  [id number] - [place], [place] Phone: [phone]  Fax: [phone]     Has the patient been seen for an appointment in the last year OR does the patient have an upcoming appointment? Yes.    Agent: Please be advised that RX refills may take up to 3 business days. We ask that you follow-up with your pharmacy.

## 2022-02-02 NOTE — Telephone Encounter (Signed)
Pt is calling to request fluticasone-salmeterol (ADVAIR DISKUS) 250-50 MCG/ACT AEPB [[id number]] to be changed Breztri. Pt reports that she was happy with the sample. Requesting script to be sent   [id number] - [place], [place] Phone: [phone]  Fax: [phone]

## 2022-02-02 NOTE — Telephone Encounter (Signed)
Discontinue Advair New rx order Breztri 2 puff TWICE A DAY to [facility] [place], DO [facility] 02/02/2022, 5:17 PM

## 2022-02-03 MED ORDER — ALBUTEROL SULFATE HFA 108 (90 BASE) MCG/ACT IN AERS: 2.0000 | INHALATION_SPRAY | RESPIRATORY_TRACT | 3 refills | Status: DC | PRN

## 2022-02-03 MED ORDER — GABAPENTIN 100 MG PO CAPS: ORAL_CAPSULE | ORAL | 3 refills | Status: DC

## 2022-02-03 MED ORDER — IBUPROFEN 800 MG PO TABS: 800.0000 mg | ORAL_TABLET | Freq: Three times a day (TID) | ORAL | 3 refills | Status: DC | PRN

## 2022-02-03 NOTE — Telephone Encounter (Signed)
Requested Prescriptions  Pending Prescriptions Disp Refills   albuterol (VENTOLIN HFA) 108 (90 Base) MCG/ACT inhaler 8 g 3    Sig: Inhale 2 puffs into the lungs every 4 (four) hours as needed for wheezing or shortness of breath.     Pulmonology:  Beta Agonists 2 Passed - 02/03/2022  9:37 AM      Passed - Last BP in normal range    BP Readings from Last 1 Encounters:  12/16/21 119/66         Passed - Last Heart Rate in normal range    Pulse Readings from Last 1 Encounters:  12/16/21 80         Passed - Valid encounter within last 12 months    Recent Outpatient Visits           2 months ago Bilateral carpal tunnel syndrome   [place], DO   1 year ago Chronic bilateral low back pain without sciatica   [place], DO   1 year ago Abscessed tooth   [facility] Kathrine Haddock, NP   2 years ago Centrilobular emphysema [facility])   [facility] [place], Devonne Doughty, DO       Future Appointments             In 2 weeks Sharolyn Douglas, Clance Boll, NP [place]. [facility]             gabapentin (NEURONTIN) 100 MG capsule 90 capsule 3    Sig: Start 1 capsule daily at bedtime, may increase by 1 additional cap every week as needed up to max of 3 at bedtime     Neurology: Anticonvulsants - gabapentin Failed - 02/03/2022  9:37 AM      Failed - Completed PHQ-2 or PHQ-9 in the last 360 days      Passed - Cr in normal range and within 360 days    Creatinine, Ser  Date Value Ref Range Status  10/10/2021 0.69 0.44 - 1.00 mg/dL Final         Passed - Valid encounter within last 12 months    Recent Outpatient Visits           2 months ago Bilateral carpal tunnel syndrome   [place], DO   1 year ago Chronic bilateral low back pain without sciatica   [facility]  Olin Hauser, DO   1 year ago Abscessed tooth   [place], NP   2 years ago Centrilobular emphysema [facility])   [facility] [place], Devonne Doughty, DO       Future Appointments             In 2 weeks Sharolyn Douglas, Clance Boll, NP [place]. [facility]             ibuprofen (ADVIL) 800 MG tablet 90 tablet 3    Sig: Take 1 tablet (800 mg total) by mouth every 8 (eight) hours as needed for moderate pain.     Analgesics:  NSAIDS Failed - 02/03/2022  9:37 AM      Failed - Manual Review: Labs are only required if the patient has taken medication for more than 8 weeks.      Passed - Cr in normal range and within  360 days    Creatinine, Ser  Date Value Ref Range Status  10/10/2021 0.69 0.44 - 1.00 mg/dL Final         Passed - HGB in normal range and within 360 days    Hemoglobin  Date Value Ref Range Status  10/10/2021 13.7 12.0 - 15.0 g/dL Final  12/17/2016 13.2 11.1 - 15.9 g/dL Final         Passed - PLT in normal range and within 360 days    Platelets  Date Value Ref Range Status  10/10/2021 225 150 - 400 K/uL Final  12/17/2016 252 150 - 379 x10E3/uL Final         Passed - HCT in normal range and within 360 days    HCT  Date Value Ref Range Status  10/10/2021 39.3 36.0 - 46.0 % Final   Hematocrit  Date Value Ref Range Status  12/17/2016 39.3 34.0 - 46.6 % Final         Passed - eGFR is 30 or above and within 360 days    GFR calc Af Amer  Date Value Ref Range Status  10/11/2019 >60 >60 mL/min Final   GFR, Estimated  Date Value Ref Range Status  10/10/2021 >60 >60 mL/min Final    Comment:    (NOTE) Calculated using the CKD-EPI Creatinine Equation (2021)          Passed - Patient is not pregnant      Passed - Valid encounter within last 12 months    Recent Outpatient Visits           2 months ago Bilateral carpal tunnel syndrome   [place], DO   1 year ago Chronic bilateral low back pain without sciatica   [facility] Olin Hauser, DO   1 year ago Abscessed tooth   [place], NP   2 years ago Centrilobular emphysema [facility])   [facility] Olin Hauser, DO       Future Appointments             In 2 weeks Sharolyn Douglas, Clance Boll, NP [place]. [place]

## 2022-02-11 ENCOUNTER — Ambulatory Visit: Payer: Medicaid Other | Admitting: Medical

## 2022-02-20 ENCOUNTER — Other Ambulatory Visit: Payer: Self-pay | Admitting: Nurse Practitioner

## 2022-02-20 ENCOUNTER — Ambulatory Visit: Payer: Medicaid Other | Attending: Medical | Admitting: Nurse Practitioner

## 2022-02-20 ENCOUNTER — Encounter: Payer: Self-pay | Admitting: Nurse Practitioner

## 2022-02-20 ENCOUNTER — Other Ambulatory Visit: Payer: Self-pay

## 2022-02-20 VITALS — BP 132/62 | HR 64 | Ht 62.0 in | Wt 198.4 lb

## 2022-02-20 DIAGNOSIS — I10 Essential (primary) hypertension: Secondary | ICD-10-CM | POA: Diagnosis not present

## 2022-02-20 DIAGNOSIS — R079 Chest pain, unspecified: Secondary | ICD-10-CM | POA: Diagnosis not present

## 2022-02-20 DIAGNOSIS — E785 Hyperlipidemia, unspecified: Secondary | ICD-10-CM | POA: Diagnosis not present

## 2022-02-20 DIAGNOSIS — I471 Supraventricular tachycardia, unspecified: Secondary | ICD-10-CM | POA: Diagnosis not present

## 2022-02-20 DIAGNOSIS — Z720 Tobacco use: Secondary | ICD-10-CM

## 2022-02-20 MED ORDER — BUPROPION HCL ER (XL) 150 MG PO TB24: 150.0000 mg | ORAL_TABLET | Freq: Two times a day (BID) | ORAL | 2 refills | Status: DC

## 2022-02-20 NOTE — Patient Instructions (Signed)
Medication Instructions:  Wellbutrin: Take 150 mg once daily for three days, then take 150 mg twice daily.  *If you need a refill on your cardiac medications before your next appointment, please call your pharmacy*   Lab Work: None ordered If you have labs (blood work) drawn today and your tests are completely normal, you will receive your results only by: [place] (if you have MyChart) OR A paper copy in the mail If you have any lab test that is abnormal or we need to change your treatment, we will call you to review the results.   Testing/Procedures: None ordered   Follow-Up: At [facility], you and your health needs are our priority.  As part of our continuing mission to provide you with exceptional heart care, we have created designated Provider Care Teams.  These Care Teams include your primary Cardiologist (physician) and Advanced Practice Providers (APPs -  Physician Assistants and Nurse Practitioners) who all work together to provide you with the care you need, when you need it.  We recommend signing up for the patient portal called "MyChart".  Sign up information is provided on this After Visit Summary.  MyChart is used to connect with patients for Virtual Visits (Telemedicine).  Patients are able to view lab/test results, encounter notes, upcoming appointments, etc.  Non-urgent messages can be sent to your provider as well.   To learn more about what you can do with MyChart, go to [email or web address].    Your next appointment:   6 month(s)  Provider:   You may see Kathlyn Sacramento, MD or one of the following Advanced Practice Providers on your designated Care Team:   Murray Hodgkins, NP Christell Faith, PA-C [place] Kathlen Mody, PA-C Gerrie Nordmann, NP

## 2022-02-20 NOTE — Progress Notes (Addendum)
Office Visit    Patient Name: Tanya Hardin Date of Encounter: 02/20/2022  Primary Care Provider:  Olin Hauser, DO Primary Cardiologist:  Tanya Sacramento, MD  Chief Complaint    57 year old female with a history of PSVT, chest pain with previous nonischemic stress testing in May 2020, aortic atherosclerosis, and ongoing tobacco abuse, who presents for follow-up related to PSVT and dyspnea.  Past Medical History    Past Medical History:  Diagnosis Date   Anemia    h/o    Anxiety    Aortic atherosclerosis ([facility])    a. 01/2020 noted on high res chest CT.   Chest pain    a. 05/2018 MV: EF 55-65%, small, mild, fixed apical ant and apical defect, most likely artifact. No ischemia-->Low risk.   Complication of anesthesia    DURING SECTION, BP DROPPED /N/V   COPD (chronic obstructive pulmonary disease) (HCC)    Depression    Dyspnea    DUE TO COPD   Essential hypertension    GERD (gastroesophageal reflux disease)    due to gallbladder   History of echocardiogram    a. 02/2015 Echo: Ef 60-65%. no rwma; b. 11/2021 Echo: EF 60-65%, no rwma, nl RV fxn, triv MR.   Hyperlipidemia    PONV (postoperative nausea and vomiting)    X1 ONLY DURING C SECTION   PSVT (paroxysmal supraventricular tachycardia)    Tobacco abuse    Past Surgical History:  Procedure Laterality Date   CESAREAN SECTION     x2   CHOLECYSTECTOMY     MOUTH SURGERY      Allergies  Allergies  Allergen Reactions   Codeine Itching, Nausea And Vomiting and Other (See Comments)    PT STATES SHE FELT NAUSEATED AND DID NOT LIKE THE WAY IT MADE HER FEEL    History of Present Illness    57 year old female with the above past medical history including paroxysmal supraventricular tachycardia, chest pain, aortic atherosclerosis, and tobacco abuse.  PSVT history dates back approximately 7 years.  Prior echo November 2017, showed normal LV function without regional wall motion abnormalities.  She was managed  with beta-blocker therapy and evaluated by electrophysiology.  She was not interested in catheter ablation.  In May 2020, she complained of worsening dyspnea.  Stress testing was performed and was low risk/nonischemic.  There was question of a small, mild, fixed apical anterior and apical defect, which was felt to be artifact.  Tanya Hardin was last in cardiology clinic in September 2023, at which time she reported left lower leg pain and bilateral foot pain.  She also reported occasional chest pain at the end of the day, usually associated with stress and anxiety.  She was still smoking heavily.  An echo was performed and showed normal LV and RV function with trivial mitral regurgitation.  She was seen in the emergency department in late November in the setting of ongoing left leg pain.  Lower extremity venous Doppler was negative for DVT or Baker's cyst.  Symptoms were felt to be musculoskeletal, and she was discharged home.  Over the past few months, she has continued to experience dyspnea on exertion.  She says she smokes heavily and attributes her dyspnea to this.  She is smoking 1 pack/day.  She knows that she needs to quit but feels as though she is "1 of those people that cannot quit."  A few times a year, she might have an episode of chest pain that typically occurs  at rest, lasts a minute or 2, and resolve spontaneously.  She expressed disinterest in pursuing additional ischemic or anatomic evaluation at this time.  She denies palpitations, PND, orthopnea, dizziness, syncope, edema, or early satiety.  Home Medications    Current Outpatient Medications  Medication Sig Dispense Refill   albuterol (VENTOLIN HFA) 108 (90 Base) MCG/ACT inhaler Inhale 2 puffs into the lungs every 4 (four) hours as needed for wheezing or shortness of breath. 8 g 3   BREZTRI AEROSPHERE 160-9-4.8 MCG/ACT AERO Inhale 2 puffs into the lungs 2 (two) times daily. 10.7 g 11   ibuprofen (ADVIL) 800 MG tablet Take 1 tablet (800  mg total) by mouth every 8 (eight) hours as needed for moderate pain. 90 tablet 3   metoprolol tartrate (LOPRESSOR) 25 MG tablet Take 1 tablet (25 mg total) by mouth 2 (two) times daily. 180 tablet 0   gabapentin (NEURONTIN) 100 MG capsule Start 1 capsule daily at bedtime, may increase by 1 additional cap every week as needed up to max of 3 at bedtime (Patient not taking: Reported on 02/20/2022) 90 capsule 3   No current facility-administered medications for this visit.     Review of Systems    Occasional episode of chest pain occurring a few times a year, lasting 1 to 2 minutes, resolving spontaneously.  She has chronic, stable dyspnea on exertion.  She continues to smoke and is interested in quitting.  She denies palpitations, PND, orthopnea, dizziness, syncope, edema, or early satiety.  All other systems reviewed and are otherwise negative except as noted above.    Physical Exam    VS:  BP (!) 148/80 (BP Location: Left Arm, Patient Position: Sitting, Cuff Size: Normal)   Pulse 64   Ht 5\' 2"  (9.509 m)   Wt 198 lb 6 oz (90 kg)   LMP 06/27/2014   SpO2 98%   BMI 36.28 kg/m  , BMI Body mass index is 36.28 kg/m.     Vitals:   02/20/22 1415 02/20/22 1438  BP: (!) 148/80 132/62  Pulse: 64   SpO2: 98%     GEN: Well nourished, well developed, in no acute distress. HEENT: normal. Neck: Supple, no JVD, carotid bruits, or masses. Cardiac: RRR, no murmurs, rubs, or gallops. No clubbing, cyanosis, edema.  Radials 2+/PT 2+ and equal bilaterally.  Respiratory:  Respirations regular and unlabored, diminished breath sounds at the bases, otherwise clear to auscultation bilaterally. GI: Soft, nontender, nondistended, BS + x 4. MS: no deformity or atrophy. Skin: warm and dry, no rash. Neuro:  Strength and sensation are intact. Psych: Normal affect.  Accessory Clinical Findings    ECG personally reviewed by me today -regular sinus rhythm, 64- no acute changes.  Lab Results  Component Value  Date   WBC 7.1 10/10/2021   HGB 13.7 10/10/2021   HCT 39.3 10/10/2021   MCV 86.2 10/10/2021   PLT 225 10/10/2021   Lab Results  Component Value Date   CREATININE 0.69 10/10/2021   BUN 10 10/10/2021   NA 141 10/10/2021   K 3.9 10/10/2021   CL 110 10/10/2021   CO2 24 10/10/2021   Lab Results  Component Value Date   ALT 15 10/10/2021   AST 17 10/10/2021   ALKPHOS 67 10/10/2021   BILITOT 0.3 10/10/2021   Lab Results  Component Value Date   CHOL 205 (H) 10/10/2021   HDL 40 (L) 10/10/2021   LDLCALC 113 (H) 10/10/2021   LDLDIRECT 140 (H) 10/10/2021  TRIG 261 (H) 10/10/2021   CHOLHDL 5.1 10/10/2021    Lab Results  Component Value Date   HGBA1C 5.5 04/01/2020    Assessment & Plan    1.  PSVT: Quiescent on beta-blocker therapy.  2.  Chest pain: Patient with prior history of chest pain and nonischemic stress testing in 2020.  She subsequently had epigastric pain that was managed with H2 blocker and PPI.  At visit in September, she reported dyspnea on exertion and occasional chest pain and underwent echocardiography which showed normal LV and RV function with trivial MR.  Results reviewed in detail today.  She occasionally notes, a few times a year, left-sided chest pain, typically at rest, lasting 1 to 2 minutes, resolving spontaneously.  We discussed options for evaluation to potentially include repeat stress testing versus coronary CT angiogram.  Prior high-resolution CT of the chest in 2022 did not show any significant coronary calcification, the aortic atherosclerosis was noted.  She declined any additional testing at this time.  3.  Tobacco abuse: Patient continues to smoke 1 pack of cigarettes per day.  She recognizes that this is likely contributing to her dyspnea on exertion in addition to her inactivity.  She says that she is interested in quitting and I initially sent in a Rx for Wellbutrin 150 mg daily x 3 days and then twice daily, as is indicated for smoking cessation.   Unfortunately, we have been advised by her pharmacy that medicaid will not cover BID dosing - will change to once daily.  I also strongly encouraged her to obtain over-the-counter nicotine replacement therapy via patches, to be used in conjunction with Wellbutrin for greatest likelihood of cessation.  4.  Essential hypertension: Blood pressure elevated on arrival 148/80.  On repeat, she was 132/62.  Continue beta-blocker therapy.  5.  Hyperlipidemia: Total cholesterol 205 with an LDL of 113, triglycerides of 261, and an HDL of 40 in September.  Aortic atherosclerosis previous noted on high-resolution CT in January 2022.  In the absence of significant lifestyle changes, would recommend addition of statin therapy.  Patient not currently interested in adding additional medicines.  6.  Disposition: Follow-up in 6 months or sooner if necessary.   Murray Hodgkins, NP 02/20/2022, 2:18 PM

## 2022-03-03 ENCOUNTER — Ambulatory Visit: Payer: Medicaid Other | Admitting: Family Medicine

## 2022-03-07 ENCOUNTER — Other Ambulatory Visit: Payer: Self-pay | Admitting: Medical

## 2022-03-11 DIAGNOSIS — H5213 Myopia, bilateral: Secondary | ICD-10-CM | POA: Diagnosis not present

## 2022-07-03 ENCOUNTER — Other Ambulatory Visit: Payer: Self-pay | Admitting: Medical

## 2022-08-14 ENCOUNTER — Encounter: Payer: Self-pay | Admitting: Emergency Medicine

## 2022-09-11 ENCOUNTER — Ambulatory Visit: Payer: Medicaid Other | Admitting: Family Medicine

## 2022-09-28 ENCOUNTER — Telehealth: Payer: Self-pay | Admitting: Medical

## 2022-09-29 NOTE — Telephone Encounter (Signed)
Please contact pt for future appointment. Pt OD for 6 month f/u.

## 2022-09-30 ENCOUNTER — Ambulatory Visit: Payer: Self-pay

## 2022-09-30 DIAGNOSIS — F411 Generalized anxiety disorder: Secondary | ICD-10-CM

## 2022-09-30 MED ORDER — FLUOXETINE HCL 10 MG PO CAPS: 10.0000 mg | ORAL_CAPSULE | Freq: Every day | ORAL | 0 refills | Status: DC

## 2022-09-30 NOTE — Telephone Encounter (Signed)
I notified patient that 30 day refill sent in to get her through appointment. She verbalized understanding.

## 2022-09-30 NOTE — Telephone Encounter (Signed)
Please notify her. I will agree to refill Fluoxetine 10mg  daily for 30 day until her apt. She can pick up rx at pharmacy [facility] [place]  Saralyn Pilar, DO [facility] 09/30/2022, 3:02 PM

## 2022-09-30 NOTE — Addendum Note (Signed)
Addended by: Smitty Cords on: 09/30/2022 03:02 PM   Modules accepted: Orders

## 2022-09-30 NOTE — Telephone Encounter (Signed)
*  STAT* If patient is at the pharmacy, call can be transferred to refill team.   1. Which medications need to be refilled? (please list name of each medication and dose if known) metoprolol tartrate (LOPRESSOR) 25 MG tablet    2. Would you like to learn more about the convenience, safety, & potential cost savings by using the [facility]?     3. Are you open to using the [facility] (Type [facility]. ).   4. Which pharmacy/location (including street and city if local pharmacy) is medication to be sent to?  [id number] - [place], [place] - [address]     5. Do they need a 30 day or 90 day supply? 90 day

## 2022-09-30 NOTE — Telephone Encounter (Signed)
  Chief Complaint: requesting Prozac reordered Symptoms: feeling anxious Prozac calms her  Frequency: chronic  Disposition: [] ED /[] Urgent Care (no appt availability in office) / [] Appointment(In office/virtual)/ []  [facility]/ [] Home Care/ [] Refused Recommended Disposition /[]  Mobile Bus/ [x]  Follow-up with PCP Additional Notes: please review request and appt date- put on wait list Reason for Disposition  MILD anxiety symptoms (e.g., Anxiety symptoms are mild and intermittent; symptoms do not interfere with daily activities)  Answer Assessment - Initial Assessment Questions 1. CONCERN: "Did anything happen that prompted you to call today?"      Wanting to be put back on Prozac which calmed her  2. ANXIETY SYMPTOMS: "Can you describe how you (your loved one; patient) have been feeling?" (e.g., tense, restless, panicky, anxious, keyed up, overwhelmed, sense of impending doom).      Anxirety now on Medicaid  3. ONSET: "How long have you been feeling this way?" (e.g., hours, days, weeks)     " A long time"  6. HISTORY: "Have you felt this way before?" "Have you ever been diagnosed with an anxiety problem in the past?" (e.g., generalized anxiety disorder, panic attacks, PTSD). If Yes, ask: "How was this problem treated?" (e.g., medicines, counseling, etc.)     Has been on Prozac and would like it ordered 8. TREATMENT:  "What has been done so far to treat this anxiety?" (e.g., medicines, relaxation strategies). "What has helped?"     Medicine,  9. TREATMENT - THERAPIST: "Do you have a counselor or therapist? Name?"     yes 10. POTENTIAL TRIGGERS: "Do you drink caffeinated beverages (e.g., 12. OTHER SYMPTOMS: "Do you have any other symptoms?" (e.g., feeling depressed, trouble concentrating, trouble sleeping, trouble breathing, palpitations or fast heartbeat, chest pain, sweating, nausea, or diarrhea)       Anxious pt wanted med request anxiety is flaring but is functional   13. PREGNANCY: "Is there any chance you are pregnant?" "When was your last menstrual period?"       N/a  Answer Assessment - Initial Assessment Questions 1. DRUG NAME: "What medicine do you need to have refilled?"     Prozac reordered 2. REFILLS REMAINING: "How many refills are remaining?" (Note: The label on the medicine or pill bottle will show how many refills are remaining. If there are no refills remaining, then a renewal may be needed.)     N/a 3. EXPIRATION DATE: "What is the expiration date?" (Note: The label states when the prescription will expire, and thus can no longer be refilled.)     N/a 4. PRESCRIBING HCP: "Who prescribed it?" Reason: If prescribed by specialist, call should be referred to that group.     *No Answer* 5. SYMPTOMS: "Do you have any symptoms?"     anxiety 6. PREGNANCY: "Is there any chance that you are pregnant?" "When was your last menstrual period?"     N/a  Protocols used: Anxiety and Panic Attack-A-AH, Medication Refill and Renewal Call-A-AH

## 2022-10-01 NOTE — Telephone Encounter (Signed)
Patient is scheduled on 1/23.

## 2022-10-06 ENCOUNTER — Telehealth: Payer: Medicaid Other | Admitting: Family Medicine

## 2022-10-28 ENCOUNTER — Encounter: Payer: Self-pay | Admitting: Family Medicine

## 2022-10-28 ENCOUNTER — Other Ambulatory Visit: Payer: Self-pay | Admitting: Family Medicine

## 2022-10-28 ENCOUNTER — Telehealth (INDEPENDENT_AMBULATORY_CARE_PROVIDER_SITE_OTHER): Payer: Medicaid Other | Admitting: Family Medicine

## 2022-10-28 DIAGNOSIS — M792 Neuralgia and neuritis, unspecified: Secondary | ICD-10-CM

## 2022-10-28 DIAGNOSIS — G5603 Carpal tunnel syndrome, bilateral upper limbs: Secondary | ICD-10-CM

## 2022-10-28 DIAGNOSIS — M47816 Spondylosis without myelopathy or radiculopathy, lumbar region: Secondary | ICD-10-CM | POA: Diagnosis not present

## 2022-10-28 DIAGNOSIS — J410 Simple chronic bronchitis: Secondary | ICD-10-CM

## 2022-10-28 DIAGNOSIS — G8929 Other chronic pain: Secondary | ICD-10-CM

## 2022-10-28 DIAGNOSIS — M79605 Pain in left leg: Secondary | ICD-10-CM

## 2022-10-28 DIAGNOSIS — F411 Generalized anxiety disorder: Secondary | ICD-10-CM

## 2022-10-28 DIAGNOSIS — J432 Centrilobular emphysema: Secondary | ICD-10-CM

## 2022-10-28 DIAGNOSIS — E782 Mixed hyperlipidemia: Secondary | ICD-10-CM | POA: Insufficient documentation

## 2022-10-28 DIAGNOSIS — M545 Low back pain, unspecified: Secondary | ICD-10-CM

## 2022-10-28 DIAGNOSIS — E538 Deficiency of other specified B group vitamins: Secondary | ICD-10-CM

## 2022-10-28 DIAGNOSIS — R7309 Other abnormal glucose: Secondary | ICD-10-CM

## 2022-10-28 MED ORDER — IBUPROFEN 800 MG PO TABS: 800.0000 mg | ORAL_TABLET | Freq: Three times a day (TID) | ORAL | 3 refills | Status: DC | PRN

## 2022-10-28 MED ORDER — ALBUTEROL SULFATE HFA 108 (90 BASE) MCG/ACT IN AERS: 2.0000 | INHALATION_SPRAY | RESPIRATORY_TRACT | 3 refills | Status: DC | PRN

## 2022-10-28 MED ORDER — ADVAIR DISKUS 250-50 MCG/ACT IN AEPB: 1.0000 | INHALATION_SPRAY | Freq: Two times a day (BID) | RESPIRATORY_TRACT | 1 refills | Status: DC

## 2022-10-28 MED ORDER — FLUOXETINE HCL 10 MG PO CAPS: 10.0000 mg | ORAL_CAPSULE | Freq: Two times a day (BID) | ORAL | 1 refills | Status: DC

## 2022-10-28 NOTE — Progress Notes (Addendum)
Subjective:    Patient ID: Tanya Hardin, female    DOB: 11-22-1965, 57 y.o.   MRN: [id number]  ALISYN Hardin is a 57 y.o. female presenting on 10/28/2022 for No chief complaint on file.  Virtual / Telehealth Encounter - Video Visit via MyChart The purpose of this virtual visit is to provide medical care while limiting exposure to the novel coronavirus (COVID19) for both patient and office staff.  Consent was obtained for remote visit:  Yes.   Answered questions that patient had about telehealth interaction:  Yes.   I discussed the limitations, risks, security and privacy concerns of performing an evaluation and management service by video/telephone. I also discussed with the patient that there may be a patient responsible charge related to this service. The patient expressed understanding and agreed to proceed.  Patient Location: Home Provider Location: [facility] (Office)  Participants in virtual visit: - Patient: [name] - CMA: Shirley Muscat CMA - Provider: Dr Althea Charon   HPI  Centrilobular Emphysema History of chronic bronchitis Needs new orders on Albuterol AS NEEDED and Advair maintenance. Unable to cover [place] cost due to not on medicaid PDL  Chronic Back Pain / Multiple joint pain  Osteoarthritis Re order ibuprofen 800mg  AS NEEDED  Anxiety Taking Fluoxetine 10mg  daily, asking about dose adjustment. Prefer to not take dose all at same time. It has been helpful for anxiety  Neuropathic Pain Reports chronic episodic leg pain radiating from back with history of Lumbar DDD Describes leg pain >1 year, current severe pain shooting in leg. Has seen Cardiology and already had ultrasound ruled out DVT She has been on treatment over course of past year without success. No longer on Gabapentin Asking about nerve study now. Prior X-rays Lumbar spine done within past 2 years  She is due for labs for routine cholesterol A1c screening.        12/08/2019   10:32 AM  Depression screen PHQ 2/9  Decreased Interest 1  Down, Depressed, Hopeless 1  PHQ - 2 Score 2  Altered sleeping 3  Tired, decreased energy 3  Change in appetite 1  Feeling bad or failure about yourself  3  Trouble concentrating 3  Moving slowly or fidgety/restless 1  Suicidal thoughts 0  PHQ-9 Score 16  Difficult doing work/chores Very difficult    Social History   Tobacco Use   Smoking status: Every Day    Current packs/day: 1.50    Average packs/day: 1.5 packs/day for 40.0 years (60.0 ttl pk-yrs)    Types: Cigarettes   Smokeless tobacco: Never   Tobacco comments:    1ppd as ot 05/24/20  //ep  Vaping Use   Vaping status: Never Used  Substance Use Topics   Alcohol use: Not Currently   Drug use: Not Currently    Comment: LAST USED WHILE IN HER 20'S    Review of Systems Per HPI unless specifically indicated above     Objective:    LMP 06/27/2014   Wt Readings from Last 3 Encounters:  02/20/22 198 lb 6 oz (90 kg)  12/16/21 200 lb (90.7 kg)  12/16/21 200 lb (90.7 kg)    Physical Exam  Note examination was completely remotely via video observation objective data only  Gen - well-appearing, no acute distress or apparent pain, comfortable HEENT - eyes appear clear without discharge or redness Heart/Lungs - cannot examine virtually - observed no evidence of coughing or labored breathing. Abd - cannot examine virtually  Skin -  face visible today- no rash Neuro - awake, alert, oriented Psych - not anxious appearing  I have personally reviewed the radiology report from 06/30/19 Lumbar X-ray.  CLINICAL DATA:  Acute nontraumatic back pain. Pain for 2 years with sudden exacerbation over the last 2 days.   EXAM: LUMBAR SPINE - 2-3 VIEW   COMPARISON:  None.   FINDINGS: Five non rib-bearing lumbar type vertebral bodies are present. Vertebral body heights are maintained. No significant listhesis is present. Mild rightward curvature is centered  at L3. Disc spaces are relatively preserved. There is some narrowing on the left at L3-4 and L4-5. There may be narrowing on the right at L5-S1. Atherosclerotic calcifications are present in the aorta. No aneurysm is evident.   IMPRESSION: 1. Mild rightward curvature centered at L3. 2. Mild degenerative changes in the lower lumbar spine as described. 3. No acute abnormality.     Electronically Signed   By: Marin Roberts M.D.   On: 06/30/2019 13:13  Results for orders placed or performed during the hospital encounter of 11/19/21  Wet prep, genital   Specimen: Urine, Clean Catch  Result Value Ref Range   Yeast Wet Prep HPF POC PRESENT (A) NONE SEEN   Trich, Wet Prep NONE SEEN NONE SEEN   Clue Cells Wet Prep HPF POC PRESENT (A) NONE SEEN   WBC, Wet Prep HPF POC <10 <10   Sperm NONE SEEN   Urine Culture   Specimen: Urine, Clean Catch  Result Value Ref Range   Specimen Description      URINE, CLEAN CATCH Performed at [facility], [address] [place], [place] [address]    Special Requests      NONE Performed at [facility], [address] [place], [place] [address]    Culture >=100,000 COLONIES/mL ESCHERICHIA COLI (A)    Report Status 11/21/2021 FINAL    Organism ID, Bacteria ESCHERICHIA COLI (A)       Susceptibility   Escherichia coli - MIC*    AMPICILLIN >=32 RESISTANT Resistant     CEFAZOLIN <=4 SENSITIVE Sensitive     CEFEPIME <=0.12 SENSITIVE Sensitive     CEFTRIAXONE <=0.25 SENSITIVE Sensitive     CIPROFLOXACIN <=0.25 SENSITIVE Sensitive     GENTAMICIN <=1 SENSITIVE Sensitive     IMIPENEM <=0.25 SENSITIVE Sensitive     NITROFURANTOIN <=16 SENSITIVE Sensitive     TRIMETH/SULFA >=320 RESISTANT Resistant     AMPICILLIN/SULBACTAM >=32 RESISTANT Resistant     PIP/TAZO <=4 SENSITIVE Sensitive     * >=100,000 COLONIES/mL ESCHERICHIA COLI  Urinalysis, Routine w reflex microscopic Urine, Clean Catch  Result Value Ref Range   Color,  Urine YELLOW YELLOW   APPearance HAZY (A) CLEAR   Specific Gravity, Urine 1.010 1.005 - 1.030   pH 5.5 5.0 - 8.0   Glucose, UA NEGATIVE NEGATIVE mg/dL   Hgb urine dipstick MODERATE (A) NEGATIVE   Bilirubin Urine NEGATIVE NEGATIVE   Ketones, ur NEGATIVE NEGATIVE mg/dL   Protein, ur NEGATIVE NEGATIVE mg/dL   Nitrite NEGATIVE NEGATIVE   Leukocytes,Ua SMALL (A) NEGATIVE  Urinalysis, Microscopic (reflex)  Result Value Ref Range   RBC / HPF 11-20 0 - 5 RBC/hpf   WBC, UA 21-50 0 - 5 WBC/hpf   Bacteria, UA FEW (A) NONE SEEN   Squamous Epithelial / HPF 0-5 0 - 5   WBC Clumps PRESENT    Budding Yeast PRESENT       Assessment & Plan:   Problem List  Items Addressed This Visit     Bilateral carpal tunnel syndrome   Relevant Medications   FLUoxetine (PROZAC) 10 MG capsule   ibuprofen (ADVIL) 800 MG tablet   Chronic bilateral low back pain without sciatica   Relevant Medications   FLUoxetine (PROZAC) 10 MG capsule   ibuprofen (ADVIL) 800 MG tablet   GAD (generalized anxiety disorder) - Primary   Relevant Medications   FLUoxetine (PROZAC) 10 MG capsule   Spondylosis of lumbar region without myelopathy or radiculopathy   Relevant Medications   ibuprofen (ADVIL) 800 MG tablet   Other Visit Diagnoses     Pain of left lower extremity       Relevant Orders   Ambulatory referral to Neurology   Neuropathic pain       Relevant Orders   Ambulatory referral to Neurology   Simple chronic bronchitis (HCC)       Relevant Medications   ADVAIR DISKUS 250-50 MCG/ACT AEPB   albuterol (VENTOLIN HFA) 108 (90 Base) MCG/ACT inhaler       Updated Proceed w/ Lumbar MRI followed by Referral to Neurosurgery Off gabapentin Will re order NSAID Ibuprofen as requested  G[employer] dose Fluoxetine from 10 to 20mg . She can do split dosing 10mg  TWICE A DAY if preferred.  COPD Emphysema Re order Albuterol + Advair per formulary  Future labs scheduled.  Orders Placed This Encounter  Procedures    Ambulatory referral to Neurology    Referral Priority:   Routine    Referral Type:   Consultation    Referral Reason:   Specialty Services Required    Requested Specialty:   Neurology    Number of Visits Requested:   1       Meds ordered this encounter  Medications   ADVAIR DISKUS 250-50 MCG/ACT AEPB    Sig: Inhale 1 puff into the lungs in the morning and at bedtime.    Dispense:  180 each    Refill:  1    90 day   albuterol (VENTOLIN HFA) 108 (90 Base) MCG/ACT inhaler    Sig: Inhale 2 puffs into the lungs every 4 (four) hours as needed for wheezing or shortness of breath.    Dispense:  18 g    Refill:  3   FLUoxetine (PROZAC) 10 MG capsule    Sig: Take 1 capsule (10 mg total) by mouth 2 (two) times daily.    Dispense:  180 capsule    Refill:  1   ibuprofen (ADVIL) 800 MG tablet    Sig: Take 1 tablet (800 mg total) by mouth every 8 (eight) hours as needed for moderate pain.    Dispense:  90 tablet    Refill:  3      Follow up plan: Return for 10/23 and 10/30.  Future labs ordered for 11/11/22   Patient verbalizes understanding with the above medical recommendations including the limitation of remote medical advice.  Specific follow-up and call-back criteria were given for patient to follow-up or seek medical care more urgently if needed.  Total duration of direct patient care provided via video conference: 15 minutes   Saralyn Pilar, DO [facility] 10/28/2022, 4:32 PM

## 2022-10-28 NOTE — Patient Instructions (Addendum)
   Please schedule a Follow-up Appointment to: Return for 10/23 and 10/30.  If you have any other questions or concerns, please feel free to call the office or send a message through MyChart. You may also schedule an earlier appointment if necessary.  Additionally, you may be receiving a survey about your experience at our office within a few days to 1 week by e-mail or mail. We value your feedback.  Saralyn Pilar, DO [facility], [place]

## 2022-10-29 ENCOUNTER — Other Ambulatory Visit: Payer: Self-pay | Admitting: Family Medicine

## 2022-10-29 DIAGNOSIS — F411 Generalized anxiety disorder: Secondary | ICD-10-CM

## 2022-10-29 NOTE — Telephone Encounter (Signed)
Requested by interface surescripts. Signed 10/28/22. Receipt confirmed by pharmacy 10/28/22 at 4:54 pm.    Future visit in 2 weeks  Requested Prescriptions  Refused Prescriptions Disp Refills   FLUoxetine (PROZAC) 10 MG capsule [Pharmacy Med Name: FLUOXETINE HCL 10 MG CAPSULE] 30 capsule     Sig: TAKE 1 CAPSULE BY MOUTH EVERY DAY     Psychiatry:  Antidepressants - SSRI Failed - 10/29/2022  1:28 AM      Failed - Completed PHQ-2 or PHQ-9 in the last 360 days      Passed - Valid encounter within last 6 months    Recent Outpatient Visits           Yesterday GAD (generalized anxiety disorder)   [place] [facility] Smitty Cords, DO   11 months ago Bilateral carpal tunnel syndrome   [place] [facility] Leonard, Netta Neat, DO   2 years ago Chronic bilateral low back pain without sciatica   [place] [facility] Smitty Cords, DO   2 years ago Abscessed tooth   [place] [facility] Gabriel Cirri, NP   2 years ago Centrilobular emphysema [facility])   [place] [facility] Smitty Cords, DO       Future Appointments             In 2 weeks Al[place] Charon, Netta Neat, DO [facility]   In 1 month Kirke Corin, Chelsea Aus, MD [facility]

## 2022-11-02 ENCOUNTER — Ambulatory Visit: Payer: Self-pay | Admitting: *Deleted

## 2022-11-02 DIAGNOSIS — B86 Scabies: Secondary | ICD-10-CM

## 2022-11-02 MED ORDER — TRIAMCINOLONE ACETONIDE 0.5 % EX CREA: 1.0000 | TOPICAL_CREAM | Freq: Two times a day (BID) | CUTANEOUS | 0 refills | Status: DC

## 2022-11-02 MED ORDER — PERMETHRIN 5 % EX CREA: TOPICAL_CREAM | CUTANEOUS | 1 refills | Status: DC

## 2022-11-02 NOTE — Telephone Encounter (Signed)
I spoke to patient and relayed Dr. Charm Rings message. She verbalized understanding of the two prescriptions and OTC treatment. She will contact us if treatment is not successful.

## 2022-11-02 NOTE — Telephone Encounter (Signed)
  Chief Complaint: rash suspect exposure to scabies. Patient and grand daughter trying on clothes and grand daughter has been prescribed "permethrin' medication . Patient requesting medication  Symptoms: "little white bumps", red skin after scratching , severe itching. Reports spreading to chest from back, under skin folds and thinks is at genitals. "Somewhere else".  Frequency: na Pertinent Negatives: Patient denies fever  Disposition: [] ED /[] Urgent Care (no appt availability in office) / [] Appointment(In office/virtual)/ []  [place] Virtual Care/ [] Home Care/ [x] Refused Recommended Disposition /[] [place] [facility]/ []  Follow-up with PCP Additional Notes:   No available my chart VV until 11/13/22 patient reports she has issues with transportation and requesting any medication today due to severe itching.    Please advise . Patient would like a call back .      Reason for Disposition  [1] Scabies is suspected (very itchy, bumpy rash) AND [2] hasn't been diagnosed  Answer Assessment - Initial Assessment Questions 1. PLACE OF EXPOSURE: "Where were you when you were exposed?" (e.g., home, work)     Trying on clothes did not say where 2. TYPE OF EXPOSURE: "How were you exposed?" "How much contact was there?"     Tried on clothes with grand daughter and then noted break out  3. DATE OF EXPOSURE: "When did the exposure occur?" (e.g., days)     Na  4. SYMPTOMS: "Do you have any symptoms?" (e.g., itching, bumpy rash)  If Yes, ask: "What does it look like?" "Which parts of your body are affected?"     Severe itching "white little bumps" turns red after scratching . On back , chest , under skin folds. And "somewhere else" genitals  5. PRIOR HISTORY: "Have you ever had scabies before?"     na 6. CLOSE CONTACTS: "Does any person who lives with you or has had close contact with you have itching?"     Yes  grand daughter after trying on clothes  and has severe itching  Protocols used:  Scabies Exposure-A-AH

## 2022-11-02 NOTE — Telephone Encounter (Signed)
Please notify patient  I will agree to treat now. We just saw her virtually for visit. She does not need to schedule immediately for this issue.  I will send rx for Permethrin topical treatment with instructions, may repeat course if need.  Also sending rx Triamcinolone topical steroid cream for topical spot itching relief.  She can take OTC Benadryl as needed for severe itching.  Saralyn Pilar, DO [facility] 11/02/2022, 1:01 PM

## 2022-11-10 ENCOUNTER — Ambulatory Visit: Payer: Self-pay

## 2022-11-10 ENCOUNTER — Encounter: Payer: Self-pay | Admitting: Family Medicine

## 2022-11-10 ENCOUNTER — Other Ambulatory Visit: Payer: Self-pay

## 2022-11-10 DIAGNOSIS — J432 Centrilobular emphysema: Secondary | ICD-10-CM

## 2022-11-10 DIAGNOSIS — R7309 Other abnormal glucose: Secondary | ICD-10-CM

## 2022-11-10 DIAGNOSIS — E782 Mixed hyperlipidemia: Secondary | ICD-10-CM

## 2022-11-10 DIAGNOSIS — E538 Deficiency of other specified B group vitamins: Secondary | ICD-10-CM

## 2022-11-10 NOTE — Telephone Encounter (Signed)
I called patient. She is scheduled on 11/13/22 at 10:20 am with Dr. Kirtland Bouchard.  She requested to move her lab appointment from tomorrow (10/23) to Friday (10/25) as well and we made that change as well.

## 2022-11-10 NOTE — Telephone Encounter (Signed)
Message from [place] H sent at 11/10/2022  2:32 PM EDT  Pt thinks what she has is scabies. Her granddaughter got and she cault from her.  Dd K sent in the medicine for scabies.  But pt states there is something all over her back she would like the dr to look at.   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 Ring worm Reason for Disposition  [1] Anxiety symptoms AND [2] has not been evaluated for this by doctor (or NP/PA)  Answer Assessment - Initial Assessment Questions 1. CONCERN: "Did anything happen that prompted you to call today?"      Anxiety and PTSD 2. ANXIETY SYMPTOMS: "Can you describe how you (your loved one; patient) have been feeling?" (e.g., tense, restless, panicky, anxious, keyed up, ovanxiouserwhelmed, sense of impending doom).     3. ONSET: "How long have you been feeling this way?" (e.g., hours, days, weeks)       "All my life" 4. SEVERITY: "How would you rate the level of anxiety?" (e.g., 0 - 10; or mild, moderate, severe).     severe 6. HISTORY: "Have you felt this way before?" "Have you ever been diagnosed with an anxiety problem in the past?" (e.g., generalized anxiety disorder, panic attacks, PTSD). If Yes, ask: "How was this problem treated?" (e.g., medicines, counseling, etc.)Never had it treated  Per daughter dx her with    PTSD, anxiety due to similar sx, h/o PA SA 7. RISK OF HARM - SUICIDAL IDEATION: "Do you  ever have thoughts of hurting or killing yourself?" If Yes, ask:  "Do you have these feelings now?" "Do you have a plan on how you would do this?"     no 8. TREATMENT:  "What has been done so far to treat this anxiety?" (e.g., medicines, relaxation strategies). "What has helped?"     nothing 9. TREATMENT - THERAPIST: "Do you have a counselor or therapist? Name?"     no 10. POTENTIAL TRIGGERS: "Do you drink caffeinated beverages (e.g., coffee, colas, teas), and how much daily?" "Do you drink alcohol or use any drugs?" "Have you started any new medicines recently?"       Per daughter "anything that is unexpected and just in general' 11. PATIENT SUPPORT: "Who is with you now?" "Who do you live with?" "Do you have family or friends who you can talk to?"        Daughter yes 12. OTHER SYMPTOMS: "Do you have any other symptoms?" (e.g., feeling depressed, trouble concentrating, trouble sleeping, trouble breathing, palpitations or fast heartbeat, chest pain, sweating, nausea, or diarrhea)       Irritability, overwhelmed 13. PREGNANCY: "Is there any chance you are pregnant?" "When was your last menstrual period?"       N/a  Protocols used: Anxiety and Panic Attack-A-AH

## 2022-11-10 NOTE — Telephone Encounter (Signed)
Please schedule her this Friday 10/25 if she is free to do office visit follow up on her rash concern  Saralyn Pilar, DO [facility] 11/10/2022, 5:04 PM

## 2022-11-11 ENCOUNTER — Other Ambulatory Visit: Payer: Medicaid Other

## 2022-11-13 ENCOUNTER — Encounter: Payer: Self-pay | Admitting: Family Medicine

## 2022-11-13 ENCOUNTER — Other Ambulatory Visit: Payer: Medicaid Other

## 2022-11-13 ENCOUNTER — Ambulatory Visit (INDEPENDENT_AMBULATORY_CARE_PROVIDER_SITE_OTHER): Payer: Medicaid Other | Admitting: Family Medicine

## 2022-11-13 VITALS — BP 108/62 | HR 64 | Wt 195.0 lb

## 2022-11-13 DIAGNOSIS — R7309 Other abnormal glucose: Secondary | ICD-10-CM | POA: Diagnosis not present

## 2022-11-13 DIAGNOSIS — L282 Other prurigo: Secondary | ICD-10-CM

## 2022-11-13 DIAGNOSIS — E538 Deficiency of other specified B group vitamins: Secondary | ICD-10-CM | POA: Diagnosis not present

## 2022-11-13 DIAGNOSIS — E782 Mixed hyperlipidemia: Secondary | ICD-10-CM | POA: Diagnosis not present

## 2022-11-13 DIAGNOSIS — J432 Centrilobular emphysema: Secondary | ICD-10-CM | POA: Diagnosis not present

## 2022-11-13 DIAGNOSIS — B356 Tinea cruris: Secondary | ICD-10-CM

## 2022-11-13 DIAGNOSIS — R21 Rash and other nonspecific skin eruption: Secondary | ICD-10-CM | POA: Diagnosis not present

## 2022-11-13 MED ORDER — FLUCONAZOLE 150 MG PO TABS: 300.0000 mg | ORAL_TABLET | ORAL | 0 refills | Status: DC

## 2022-11-13 MED ORDER — PREDNISONE 20 MG PO TABS: ORAL_TABLET | ORAL | 0 refills | Status: DC

## 2022-11-13 MED ORDER — HYDROXYZINE PAMOATE 25 MG PO CAPS: 25.0000 mg | ORAL_CAPSULE | Freq: Three times a day (TID) | ORAL | 0 refills | Status: DC | PRN

## 2022-11-13 NOTE — Patient Instructions (Addendum)
Thank you for coming to the office today.    Please schedule a Follow-up Appointment to: Return if symptoms worsen or fail to improve.  If you have any other questions or concerns, please feel free to call the office or send a message through [place]. You may also schedule an earlier appointment if necessary.  Additionally, you may be receiving a survey about your experience at our office within a few days to 1 week by e-mail or mail. We value your feedback.  Nobie Putnam, DO [place]

## 2022-11-13 NOTE — Progress Notes (Signed)
Subjective:    Patient ID: Tanya Hardin, female    DOB: [birth year], 57 y.o.   MRN: [id number]  Tanya Hardin is a 57 y.o. female presenting on 11/13/2022 for Rash (Pt stated---redness, itching-2 weeks. Tried OTC cream)   HPI  Discussed the use of AI scribe software for clinical note transcription with the patient, who gave verbal consent to proceed.     Rash, pruritic  She presents with a month-long history of pruritus and rash. The symptoms began after cleaning a house infested with bugs. The rash is widespread, affecting the back, torso, and pubic area. The patient has been using permethrin cream, which initially seemed to help, but the itching has persisted.  She did a repeat Permethrin treatment as well. The patient has also been using Kenalog cream for the itching. The patient has been scratching the affected areas. The patient reports significant distress due to the persistent itching, leading to feelings of anxiety, anger, and depression. Despite two rounds of permethrin treatment, the symptoms have not resolved. The patient denies any changes in environmental factors or potential allergens.         11/13/2022    1:10 PM 12/08/2019   10:32 AM  Depression screen PHQ 2/9  Decreased Interest 1 1  Down, Depressed, Hopeless 1 1  PHQ - 2 Score 2 2  Altered sleeping 2 3  Tired, decreased energy 2 3  Change in appetite 0 1  Feeling bad or failure about yourself  0 3  Trouble concentrating 0 3  Moving slowly or fidgety/restless 0 1  Suicidal thoughts 0 0  PHQ-9 Score 6 16  Difficult doing work/chores Somewhat difficult Very difficult    Social History   Tobacco Use   Smoking status: Every Day    Current packs/day: 1.50    Average packs/day: 1.5 packs/day for 40.0 years (60.0 ttl pk-yrs)    Types: Cigarettes   Smokeless tobacco: Never   Tobacco comments:    1ppd as ot 05/24/20  //ep  Vaping Use   Vaping status: Never Used  Substance Use Topics   Alcohol use: Not  Currently   Drug use: Not Currently    Comment: LAST USED WHILE IN HER 20'S    Review of Systems Per HPI unless specifically indicated above     Objective:    BP 108/62   Pulse 64   Wt 195 lb (88.5 kg)   LMP 06/27/2014   SpO2 96%   BMI 35.67 kg/m   Wt Readings from Last 3 Encounters:  11/13/22 195 lb (88.5 kg)  02/20/22 198 lb 6 oz (90 kg)  12/16/21 200 lb (90.7 kg)    Physical Exam Vitals and nursing note reviewed.  Constitutional:      General: She is not in acute distress.    Appearance: Normal appearance. She is well-developed. She is not diaphoretic.     Comments: Well-appearing, comfortable, cooperative  HENT:     Head: Normocephalic and atraumatic.  Eyes:     General:        Right eye: No discharge.        Left eye: No discharge.     Conjunctiva/sclera: Conjunctivae normal.  Cardiovascular:     Rate and Rhythm: Normal rate.  Pulmonary:     Effort: Pulmonary effort is normal.  Skin:    General: Skin is warm and dry.     Findings: Lesion and rash (widespread torso maculopapular rash with slightly raised itchy irriated skin. no ulceration.  no scabs or other excoriations other than spots from her scratching. no rash on fingers or hand.) present. No erythema.  Neurological:     Mental Status: She is alert and oriented to person, place, and time.  Psychiatric:        Mood and Affect: Mood normal.        Behavior: Behavior normal.        Thought Content: Thought content normal.     Comments: Well groomed, good eye contact, normal speech and thoughts    Results for orders placed or performed during the hospital encounter of 11/19/21  Wet prep, genital   Specimen: Urine, Clean Catch  Result Value Ref Range   Yeast Wet Prep HPF POC PRESENT (A) NONE SEEN   Trich, Wet Prep NONE SEEN NONE SEEN   Clue Cells Wet Prep HPF POC PRESENT (A) NONE SEEN   WBC, Wet Prep HPF POC <10 <10   Sperm NONE SEEN   Urine Culture   Specimen: Urine, Clean Catch  Result Value Ref  Range   Specimen Description      URINE, CLEAN CATCH Performed at [facility], [address] [place], [place] [address]    Special Requests      NONE Performed at [facility], [address] [place], [place] [address]    Culture >=100,000 COLONIES/mL ESCHERICHIA COLI (A)    Report Status 11/21/2021 FINAL    Organism ID, Bacteria ESCHERICHIA COLI (A)       Susceptibility   Escherichia coli - MIC*    AMPICILLIN >=32 RESISTANT Resistant     CEFAZOLIN <=4 SENSITIVE Sensitive     CEFEPIME <=0.12 SENSITIVE Sensitive     CEFTRIAXONE <=0.25 SENSITIVE Sensitive     CIPROFLOXACIN <=0.25 SENSITIVE Sensitive     GENTAMICIN <=1 SENSITIVE Sensitive     IMIPENEM <=0.25 SENSITIVE Sensitive     NITROFURANTOIN <=16 SENSITIVE Sensitive     TRIMETH/SULFA >=320 RESISTANT Resistant     AMPICILLIN/SULBACTAM >=32 RESISTANT Resistant     PIP/TAZO <=4 SENSITIVE Sensitive     * >=100,000 COLONIES/mL ESCHERICHIA COLI  Urinalysis, Routine w reflex microscopic Urine, Clean Catch  Result Value Ref Range   Color, Urine YELLOW YELLOW   APPearance HAZY (A) CLEAR   Specific Gravity, Urine 1.010 1.005 - 1.030   pH 5.5 5.0 - 8.0   Glucose, UA NEGATIVE NEGATIVE mg/dL   Hgb urine dipstick MODERATE (A) NEGATIVE   Bilirubin Urine NEGATIVE NEGATIVE   Ketones, ur NEGATIVE NEGATIVE mg/dL   Protein, ur NEGATIVE NEGATIVE mg/dL   Nitrite NEGATIVE NEGATIVE   Leukocytes,Ua SMALL (A) NEGATIVE  Urinalysis, Microscopic (reflex)  Result Value Ref Range   RBC / HPF 11-20 0 - 5 RBC/hpf   WBC, UA 21-50 0 - 5 WBC/hpf   Bacteria, UA FEW (A) NONE SEEN   Squamous Epithelial / HPF 0-5 0 - 5   WBC Clumps PRESENT    Budding Yeast PRESENT       Assessment & Plan:   Problem List Items Addressed This Visit   None Visit Diagnoses     Rash    -  Primary   Relevant Medications   predniSONE (DELTASONE) 20 MG tablet   Tinea cruris       Relevant Medications   fluconazole (DIFLUCAN) 150 MG tablet    Pruritic rash       Relevant Medications   hydrOXYzine (VISTARIL) 25 MG capsule   predniSONE (DELTASONE) 20 MG tablet  Assessment and Plan    Pruritic Rash Persistent itching and rash on the back and pubic area for the past month. Two rounds of permethrin treatment completed with partial relief. Appearance of the rash is suggestive of eczema, but fungal infection cannot be ruled out. No signs of bacterial infection.  -Start Diflucan 300mg  once weekly for up to four weeks for potential fungal infection. -Start Prednisone taper to reduce inflammation and itching. Reviewed precautions -Start Hydroxyzine for symptomatic relief of itching. AS NEEDED caution of sedation -Recheck in one week to assess response to treatment. (Already has existing apt coming up)  Anxiety/Depression Reports of increased anxiety and depression due to persistent itching and rash. -Continue to monitor and provide supportive care.      Meds ordered this encounter  Medications   hydrOXYzine (VISTARIL) 25 MG capsule    Sig: Take 1 capsule (25 mg total) by mouth every 8 (eight) hours as needed.    Dispense:  30 capsule    Refill:  0   predniSONE (DELTASONE) 20 MG tablet    Sig: Take daily with food. Start with 60mg  (3 pills) x 2 days, then reduce to 40mg  (2 pills) x 2 days, then 20mg  (1 pill) x 3 days    Dispense:  13 tablet    Refill:  0   fluconazole (DIFLUCAN) 150 MG tablet    Sig: Take 2 tablets (300 mg total) by mouth once a week. For up to 4 weeks    Dispense:  8 tablet    Refill:  0     Follow up plan: Return if symptoms worsen or fail to improve.   Saralyn Pilar, DO [facility] 11/13/2022, 10:58 AM

## 2022-11-14 LAB — CBC WITH DIFFERENTIAL/PLATELET
Absolute Lymphocytes: 2334 {cells}/uL (ref 850–3900)
Absolute Monocytes: 423 {cells}/uL (ref 200–950)
Basophils Absolute: 52 {cells}/uL (ref 0–200)
Basophils Relative: 0.8 %
Eosinophils Absolute: 189 {cells}/uL (ref 15–500)
Eosinophils Relative: 2.9 %
HCT: 40.7 % (ref 35.0–45.0)
Hemoglobin: 13.6 g/dL (ref 11.7–15.5)
MCH: 30.6 pg (ref 27.0–33.0)
MCHC: 33.4 g/dL (ref 32.0–36.0)
MCV: 91.5 fL (ref 80.0–100.0)
MPV: 11.8 fL (ref 7.5–12.5)
Monocytes Relative: 6.5 %
Neutro Abs: 3504 {cells}/uL (ref 1500–7800)
Neutrophils Relative %: 53.9 %
Platelets: 221 10*3/uL (ref 140–400)
RBC: 4.45 10*6/uL (ref 3.80–5.10)
RDW: 12.7 % (ref 11.0–15.0)
Total Lymphocyte: 35.9 %
WBC: 6.5 10*3/uL (ref 3.8–10.8)

## 2022-11-14 LAB — HEMOGLOBIN A1C
Hgb A1c MFr Bld: 5.9 %{Hb} — ABNORMAL HIGH (ref <5.7)
Mean Plasma Glucose: 123 mg/dL
eAG (mmol/L): 6.8 mmol/L

## 2022-11-14 LAB — TSH: TSH: 3.29 m[IU]/L (ref 0.40–4.50)

## 2022-11-14 LAB — COMPLETE METABOLIC PANEL WITH GFR
AG Ratio: 1.1 (calc) (ref 1.0–2.5)
ALT: 12 U/L (ref 6–29)
AST: 11 U/L (ref 10–35)
Albumin: 3.9 g/dL (ref 3.6–5.1)
Alkaline phosphatase (APISO): 68 U/L (ref 37–153)
BUN: 10 mg/dL (ref 7–25)
CO2: 24 mmol/L (ref 20–32)
Calcium: 9.1 mg/dL (ref 8.6–10.4)
Chloride: 108 mmol/L (ref 98–110)
Creat: 0.66 mg/dL (ref 0.50–1.03)
Globulin: 3.6 g/dL (ref 1.9–3.7)
Glucose, Bld: 110 mg/dL — ABNORMAL HIGH (ref 65–99)
Potassium: 4.1 mmol/L (ref 3.5–5.3)
Sodium: 141 mmol/L (ref 135–146)
Total Bilirubin: 0.5 mg/dL (ref 0.2–1.2)
Total Protein: 7.5 g/dL (ref 6.1–8.1)
eGFR: 102 mL/min/{1.73_m2} (ref >=60)

## 2022-11-14 LAB — LIPID PANEL
Cholesterol: 198 mg/dL (ref <200)
HDL: 42 mg/dL — ABNORMAL LOW (ref >=50)
LDL Cholesterol (Calc): 124 mg/dL — ABNORMAL HIGH
Non-HDL Cholesterol (Calc): 156 mg/dL — ABNORMAL HIGH (ref <130)
Total CHOL/HDL Ratio: 4.7 (calc) (ref <5.0)
Triglycerides: 197 mg/dL — ABNORMAL HIGH (ref <150)

## 2022-11-14 LAB — VITAMIN B12: Vitamin B-12: 340 pg/mL (ref 200–1100)

## 2022-11-18 ENCOUNTER — Ambulatory Visit: Payer: Medicaid Other | Admitting: Family Medicine

## 2022-11-19 ENCOUNTER — Encounter: Payer: Self-pay | Admitting: Family Medicine

## 2022-11-19 DIAGNOSIS — M4726 Other spondylosis with radiculopathy, lumbar region: Secondary | ICD-10-CM

## 2022-11-23 ENCOUNTER — Ambulatory Visit: Payer: Self-pay

## 2022-11-23 DIAGNOSIS — B356 Tinea cruris: Secondary | ICD-10-CM

## 2022-11-23 DIAGNOSIS — R21 Rash and other nonspecific skin eruption: Secondary | ICD-10-CM

## 2022-11-23 DIAGNOSIS — L282 Other prurigo: Secondary | ICD-10-CM

## 2022-11-23 MED ORDER — CLOTRIMAZOLE-BETAMETHASONE 1-0.05 % EX CREA: TOPICAL_CREAM | CUTANEOUS | 1 refills | Status: DC

## 2022-11-23 NOTE — Telephone Encounter (Signed)
    Chief Complaint: Continued red rash to back and trunk. Finished Prednisone ."It made me feel sick."  Severe itching continues. Symptoms: Above Frequency: 1 Month ago Pertinent Negatives: Patient denies fever, but sometimes has chills Disposition: [] ED /[] Urgent Care (no appt availability in office) / [] Appointment(In office/virtual)/ []  [place] Virtual [facility]/ [] Home Care/ [] Refused Recommended Disposition /[] [place] Mobile [facility]/ []  Follow-up with PCP Additional Notes: Asking for medication. Reason for Disposition  Mild widespread rash  (Exception: Heat rash lasting 3 days or less.)  Answer Assessment - Initial Assessment Questions 1. APPEARANCE of RASH: "Describe the rash." (e.g., spots, blisters, raised areas, skin peeling, scaly)     Red bumps 2. SIZE: "How big are the spots?" (e.g., tip of pen, eraser, coin; inches, centimeters)     Small 3. LOCATION: "Where is the rash located?"     Back and side 4. COLOR: "What color is the rash?" (Note: It is difficult to assess rash color in people with darker-colored skin. When this situation occurs, simply ask the caller to describe what they see.)     Red 5. ONSET: "When did the rash begin?"     1 month ago 6. FEVER: "Do you have a fever?" If Yes, ask: "What is your temperature, how was it measured, and when did it start?"     No 7. ITCHING: "Does the rash itch?" If Yes, ask: "How bad is the itch?" (Scale 1-10; or mild, moderate, severe)     Severe 8. CAUSE: "What do you think is causing the rash?"     Unsure 9. MEDICINE FACTORS: "Have you started any new medicines within the last 2 weeks?" (e.g., antibiotics)      Yes for rash 10. OTHER SYMPTOMS: "Do you have any other symptoms?" (e.g., dizziness, headache, sore throat, joint pain)       No 11. PREGNANCY: "Is there any chance you are pregnant?" "When was your last menstrual period?"       No  Protocols used: Rash or Redness - [facility]

## 2022-11-23 NOTE — Addendum Note (Signed)
Addended by: Smitty Cords on: 11/23/2022 04:06 PM   Modules accepted: Orders

## 2022-11-23 NOTE — Telephone Encounter (Signed)
Please contact her back  Could you check if she filled and started or completed the Diflucan - oral anti fungal? There was a possibility she was experiencing a fungal rash.  She has tried: - Kenalog topical steroid - Prednisone oral steroid - Permethrin topical scabies treatment x 2 - Hydroxyzine anti itch medication oral  Let me know if she tried this one, if not I would suggest taking it.   Only other option is a topical combo cream strong steroid + antifungal.  Otherwise, I am not sure what else to offer, other than referral to Dermatology  Saralyn Pilar, DO [facility] 11/23/2022, 12:07 PM

## 2022-11-23 NOTE — Addendum Note (Signed)
Addended by: Smitty Cords on: 11/23/2022 05:16 PM   Modules accepted: Orders

## 2022-11-23 NOTE — Telephone Encounter (Signed)
She has been taking the Diflucan as prescribed. She started it when you prescribed it.   She would like for you to send in the Cream to [facility], [place] and place a dermatology referral.

## 2022-11-24 ENCOUNTER — Ambulatory Visit: Admission: RE | Admit: 2022-11-24 | Payer: Medicaid Other | Source: Ambulatory Visit

## 2022-11-26 ENCOUNTER — Encounter: Payer: Self-pay | Admitting: Family Medicine

## 2022-11-26 ENCOUNTER — Ambulatory Visit: Payer: Medicaid Other | Admitting: Family Medicine

## 2022-11-26 VITALS — BP 98/64 | HR 64 | Ht 62.0 in | Wt 195.0 lb

## 2022-11-26 DIAGNOSIS — F411 Generalized anxiety disorder: Secondary | ICD-10-CM

## 2022-11-26 DIAGNOSIS — F331 Major depressive disorder, recurrent, moderate: Secondary | ICD-10-CM | POA: Diagnosis not present

## 2022-11-26 DIAGNOSIS — L739 Follicular disorder, unspecified: Secondary | ICD-10-CM | POA: Diagnosis not present

## 2022-11-26 DIAGNOSIS — Z23 Encounter for immunization: Secondary | ICD-10-CM

## 2022-11-26 DIAGNOSIS — J432 Centrilobular emphysema: Secondary | ICD-10-CM

## 2022-11-26 DIAGNOSIS — L282 Other prurigo: Secondary | ICD-10-CM

## 2022-11-26 MED ORDER — DOXYCYCLINE HYCLATE 100 MG PO TABS
100.0000 mg | ORAL_TABLET | Freq: Two times a day (BID) | ORAL | 0 refills | Status: DC
Start: 2022-11-26 — End: 2023-10-12

## 2022-11-26 MED ORDER — BUSPIRONE HCL 5 MG PO TABS: 5.0000 mg | ORAL_TABLET | Freq: Three times a day (TID) | ORAL | 2 refills | Status: DC | PRN

## 2022-11-26 NOTE — Progress Notes (Signed)
Subjective:    Patient ID: Tanya Hardin, female    DOB: 09/11/1965, 57 y.o.   MRN: [id number]  Tanya Hardin is a 57 y.o. female presenting on 11/26/2022 for Follow-up, Rash (Back and arms), and Fatigue (/)   HPI  Discussed the use of AI scribe software for clinical note transcription with the patient, who gave verbal consent to proceed.  Rash, dermatitis Anxiety  Last seen for same issue on 11/13/22, see prior notes. She has been treated for possible scabies with permethrin, topical steroid, oral antifungal.  The patient presents with a persistent, unidentified rash that has been causing significant distress. The rash is described as splotchy and patchy, with a rough and irritated texture. The patient reports that new spots keep appearing, and the rash has been accompanied by chills and cold sweats, leading the patient to suspect an infection. The patient also reports feeling unwell, as if she is coming down with an illness.  The patient has been experiencing significant anxiety, which she attributes to the rash and its unknown cause. She has been taking 10mg  of Prozac daily, but has been considering increasing the dose to 20mg  to better manage her anxiety. However, she has decided to delay this increase until the rash is under control, to better isolate the effects of the increased Prozac dosage.  The patient has also been dealing with prediabetes, with a recent A1c of 5.9. She has been making lifestyle changes, including quitting smoking and planning to start an exercise routine and improve her diet. She has also been considering the use of Buspar for anxiety management.  Additionally awaiting MRI Lumbar spine imaging, she had to cancel last apt. Needs to re-schedule. She needs imaging prior to referral to Neurosurgery/Neurology  The patient has recently quit smoking and has been using a vape as a substitute. She plans to transition to a nicotine-free vape in the near future. She has  also been considering the possibility of having ADHD, but is hesitant to start medication for it.           11/26/2022   10:25 AM 11/13/2022    1:10 PM 12/08/2019   10:32 AM  Depression screen PHQ 2/9  Decreased Interest 1 1 1   Down, Depressed, Hopeless 2 1 1   PHQ - 2 Score 3 2 2   Altered sleeping 1 2 3   Tired, decreased energy 3 2 3   Change in appetite 0 0 1  Feeling bad or failure about yourself  2 0 3  Trouble concentrating 2 0 3  Moving slowly or fidgety/restless 1 0 1  Suicidal thoughts 0 0 0  PHQ-9 Score 12 6 16   Difficult doing work/chores Somewhat difficult Somewhat difficult Very difficult    Social History   Tobacco Use   Smoking status: Former    Average packs/day: 1.5 packs/day for 40.0 years (60.0 ttl pk-yrs)    Types: Cigarettes    Start date: 11/20/1982   Smokeless tobacco: Never   Tobacco comments:    1ppd as ot 05/24/20  //ep  Vaping Use   Vaping status: Never Used  Substance Use Topics   Alcohol use: Not Currently   Drug use: Not Currently    Comment: LAST USED WHILE IN HER 20'S    Review of Systems Per HPI unless specifically indicated above     Objective:    BP 98/64   Pulse 64   Ht 5\' 2"  (1.575 m)   Wt 195 lb (88.5 kg)   LMP  06/27/2014   SpO2 96%   BMI 35.67 kg/m   Wt Readings from Last 3 Encounters:  11/26/22 195 lb (88.5 kg)  11/13/22 195 lb (88.5 kg)  02/20/22 198 lb 6 oz (90 kg)    Physical Exam Vitals and nursing note reviewed.  Constitutional:      General: She is not in acute distress.    Appearance: Normal appearance. She is well-developed. She is not diaphoretic.     Comments: Well-appearing, comfortable, cooperative  HENT:     Head: Normocephalic and atraumatic.  Eyes:     General:        Right eye: No discharge.        Left eye: No discharge.     Conjunctiva/sclera: Conjunctivae normal.  Cardiovascular:     Rate and Rhythm: Normal rate.  Pulmonary:     Effort: Pulmonary effort is normal.  Skin:    General:  Skin is warm and dry.     Findings: Rash (various patches of dry irritated flaky skin. dermatitis appearance.) present. No erythema.  Neurological:     Mental Status: She is alert and oriented to person, place, and time.  Psychiatric:        Mood and Affect: Mood normal.        Behavior: Behavior normal.        Thought Content: Thought content normal.     Comments: Well groomed, good eye contact, normal speech and thoughts. Anxious with some rapid speech.      Results for orders placed or performed in visit on 11/10/22  Vitamin B12  Result Value Ref Range   Vitamin B-12 340 200 - 1,100 pg/mL  TSH  Result Value Ref Range   TSH 3.29 0.40 - 4.50 mIU/L  CBC with Differential/Platelet  Result Value Ref Range   WBC 6.5 3.8 - 10.8 Thousand/uL   RBC 4.45 3.80 - 5.10 Million/uL   Hemoglobin 13.6 11.7 - 15.5 g/dL   HCT 16.1 09.6 - 04.5 %   MCV 91.5 80.0 - 100.0 fL   MCH 30.6 27.0 - 33.0 pg   MCHC 33.4 32.0 - 36.0 g/dL   RDW 40.9 81.1 - 91.4 %   Platelets 221 140 - 400 Thousand/uL   MPV 11.8 7.5 - 12.5 fL   Neutro Abs 3,504 1,500 - 7,800 cells/uL   Absolute Lymphocytes 2,334 850 - 3,900 cells/uL   Absolute Monocytes 423 200 - 950 cells/uL   Eosinophils Absolute 189 15 - 500 cells/uL   Basophils Absolute 52 0 - 200 cells/uL   Neutrophils Relative % 53.9 %   Total Lymphocyte 35.9 %   Monocytes Relative 6.5 %   Eosinophils Relative 2.9 %   Basophils Relative 0.8 %  COMPLETE METABOLIC PANEL WITH GFR  Result Value Ref Range   Glucose, Bld 110 (H) 65 - 99 mg/dL   BUN 10 7 - 25 mg/dL   Creat 7.82 9.56 - 2.13 mg/dL   eGFR 086 > OR = 60 VH/QIO/9.62X5   BUN/Creatinine Ratio SEE NOTE: 6 - 22 (calc)   Sodium 141 135 - 146 mmol/L   Potassium 4.1 3.5 - 5.3 mmol/L   Chloride 108 98 - 110 mmol/L   CO2 24 20 - 32 mmol/L   Calcium 9.1 8.6 - 10.4 mg/dL   Total Protein 7.5 6.1 - 8.1 g/dL   Albumin 3.9 3.6 - 5.1 g/dL   Globulin 3.6 1.9 - 3.7 g/dL (calc)   AG Ratio 1.1 1.0 - 2.5 (calc)  Total Bilirubin 0.5 0.2 - 1.2 mg/dL   Alkaline phosphatase (APISO) 68 37 - 153 U/L   AST 11 10 - 35 U/L   ALT 12 6 - 29 U/L  Lipid panel  Result Value Ref Range   Cholesterol 198 <200 mg/dL   HDL 42 (L) > OR = 50 mg/dL   Triglycerides 161 (H) <150 mg/dL   LDL Cholesterol (Calc) 124 (H) mg/dL (calc)   Total CHOL/HDL Ratio 4.7 <5.0 (calc)   Non-HDL Cholesterol (Calc) 156 (H) <130 mg/dL (calc)  Hemoglobin W9U  Result Value Ref Range   Hgb A1c MFr Bld 5.9 (H) <5.7 % of total Hgb   Mean Plasma Glucose 123 mg/dL   eAG (mmol/L) 6.8 mmol/L      Assessment & Plan:   Problem List Items Addressed This Visit     Centrilobular emphysema (HCC) - Primary   GAD (generalized anxiety disorder)   Relevant Medications   busPIRone (BUSPAR) 5 MG tablet   Major depressive disorder, recurrent, moderate (HCC)   Relevant Medications   busPIRone (BUSPAR) 5 MG tablet   Other Visit Diagnoses     Pruritic rash       Folliculitis       Relevant Medications   doxycycline (VIBRA-TABS) 100 MG tablet   Needs flu shot       Relevant Orders   Flu vaccine trivalent PF, 6mos and older(Flulaval,Afluria,Fluarix,Fluzone) (Completed)           Skin Rash / Dermatitis Multiple prior failed treatments including topical steroid, permethrin, oral anti fungal Possibility of itch scratch dermatitis with anxiety present as well. Discussed less likely bacterial. She has anxiety about possibility of MRSA or folliculitis in some areas -Start Doxycycline for possibility of skin infection. -Continue Clotrimazole and Betamethasone cream (note has not picked up yet) Has Dermatology scheduled on 11/27  Anxiety Patient reports high levels of anxiety and is currently on Prozac 10mg  daily. Patient plans to increase to 20mg  daily once skin rash is under control. -Consider Buspar 5mg  up to three times a day as needed for additional anxiety control.  Prediabetes A1C of 5.9, consistent with mild prediabetes. -Continue  lifestyle modifications including limiting excess carbs, starch, and sugars.  Hyperlipidemia Mildly elevated cholesterol, but not in range that requires treatment. -Continue lifestyle modifications including diet and exercise.  Lumbar Spine DDD She is asked to re-schedule her Lumbar MRI imaging    Orders Placed This Encounter  Procedures   Flu vaccine trivalent PF, 6mos and older(Flulaval,Afluria,Fluarix,Fluzone)     Meds ordered this encounter  Medications   doxycycline (VIBRA-TABS) 100 MG tablet    Sig: Take 1 tablet (100 mg total) by mouth 2 (two) times daily. For 10 days. Take with full glass of water, stay upright 30 min after taking.    Dispense:  20 tablet    Refill:  0   busPIRone (BUSPAR) 5 MG tablet    Sig: Take 1 tablet (5 mg total) by mouth 3 (three) times daily as needed.    Dispense:  90 tablet    Refill:  2      Follow up plan: Return if symptoms worsen or fail to improve.  Saralyn Pilar, DO [facility] 11/26/2022, 10:34 AM

## 2022-11-26 NOTE — Patient Instructions (Addendum)
Thank you for coming to the office today.  Recent Labs    11/13/22 0931  HGBA1C 5.9*   Pre Diabetes Goal to limit excess carb starch sugars  Congrats on the quitting smoking!  Cholesterol result is mild elevated but not in range that requires treatment.  Start taking Doxycycline antibiotic 100mg  twice daily for 10 days. Take with full glass of water and stay upright for at least 30 min after taking, may be seated or standing, but should NOT lay down. This is just a safety precaution, if this medicine does not go all the way down throat well it could cause some burning discomfort to throat and esophagus.  Keep on Prozac 10mg  x 2 = 20mg  daily for now.  Buspar for anxiety 5mg  up to 3 times per day as needed for anxiety.  [facility] Radiology dept for Lumbar   Please schedule a Follow-up Appointment to: Return if symptoms worsen or fail to improve.  If you have any other questions or concerns, please feel free to call the office or send a message through MyChart. You may also schedule an earlier appointment if necessary.  Additionally, you may be receiving a survey about your experience at our office within a few days to 1 week by e-mail or mail. We value your feedback.  Saralyn Pilar, DO [facility] [place]

## 2022-11-27 ENCOUNTER — Ambulatory Visit: Payer: Self-pay

## 2022-11-27 DIAGNOSIS — B86 Scabies: Secondary | ICD-10-CM

## 2022-11-27 MED ORDER — IVERMECTIN 3 MG PO TABS: ORAL_TABLET | ORAL | 0 refills | Status: DC

## 2022-11-27 NOTE — Telephone Encounter (Signed)
      Chief Complaint: "I know I have scabies and I want him to call in the oral pill for this. I have some new places today." Symptoms: Above Frequency: Months Pertinent Negatives: Patient denies  Disposition: [] ED /[] Urgent Care (no appt availability in office) / [] Appointment(In office/virtual)/ []  [place] [facility]/ [] Home Care/ [] Refused Recommended Disposition /[] [place] [facility]/ [x]  Follow-up with PCP Additional Notes: Please advise pt.  Reason for Disposition  Mild widespread rash  (Exception: Heat rash lasting 3 days or less.)  Answer Assessment - Initial Assessment Questions 1. APPEARANCE of RASH: "Describe the rash." (e.g., spots, blisters, raised areas, skin peeling, scaly)     Small spots 2. SIZE: "How big are the spots?" (e.g., tip of pen, eraser, coin; inches, centimeters)     Small 3. LOCATION: "Where is the rash located?"     Back, pubic 4. COLOR: "What color is the rash?" (Note: It is difficult to assess rash color in people with darker-colored skin. When this situation occurs, simply ask the caller to describe what they see.)     Red 5. ONSET: "When did the rash begin?"     For awhile 6. FEVER: "Do you have a fever?" If Yes, ask: "What is your temperature, how was it measured, and when did it start?"     No 7. ITCHING: "Does the rash itch?" If Yes, ask: "How bad is the itch?" (Scale 1-10; or mild, moderate, severe)     Severe 8. CAUSE: "What do you think is causing the rash?"     Scabies 9. MEDICINE FACTORS: "Have you started any new medicines within the last 2 weeks?" (e.g., antibiotics)      N/a 10. OTHER SYMPTOMS: "Do you have any other symptoms?" (e.g., dizziness, headache, sore throat, joint pain)       No 11. PREGNANCY: "Is there any chance you are pregnant?" "When was your last menstrual period?"       No  Protocols used: Rash or Redness - [facility]

## 2022-11-27 NOTE — Telephone Encounter (Signed)
Sent rx Ivermectin 1 dose then repeat in 14 days. To [facility] [place]  Attempted to call her, unable to reach her.  Please notify her of rx sent if she calls back  Saralyn Pilar, DO [facility] 11/27/2022, 5:31 PM

## 2022-11-27 NOTE — Addendum Note (Signed)
Addended by: Smitty Cords on: 11/27/2022 05:31 PM   Modules accepted: Orders

## 2022-11-30 NOTE — Telephone Encounter (Signed)
Patient has already received medication and is doing better.

## 2022-12-08 ENCOUNTER — Ambulatory Visit: Payer: Self-pay | Admitting: *Deleted

## 2022-12-08 DIAGNOSIS — B86 Scabies: Secondary | ICD-10-CM

## 2022-12-08 MED ORDER — PERMETHRIN 5 % EX CREA: TOPICAL_CREAM | CUTANEOUS | 0 refills | Status: DC

## 2022-12-08 NOTE — Telephone Encounter (Signed)
Patient aware of recommendations.  

## 2022-12-08 NOTE — Telephone Encounter (Signed)
Message from [place] C sent at 12/08/2022  8:46 AM EST  Summary: skin irritation / rx req   The patient shares that they have experienced skin irritation for roughly 1 month and continued to find some small patches of irritation  The patient has been prescribed permethrin (ELIMITE) 5 % cream   [[id number]] and would like to continue taking the medication  Please contact the patient further when possible to discuss their continued irritation and rx req          Call History  Contact Date/Time Type Contact Phone/Fax User  12/08/2022 08:42 AM EST Phone (Incoming) Tanya Hardin, Tanya Hardin (Self) [phone] Judie Petit) Coley, Everette A   Reason for Disposition  Hives or itching    Been diagnosed with scabies.   Requesting refill of permethrin.  See notes for details.  Answer Assessment - Initial Assessment Questions 1. APPEARANCE of RASH: "Describe the rash." (e.g., spots, blisters, raised areas, skin peeling, scaly)     I took the Ivermectin but I don't want to take it again.  I'm supposed to take it again in 14 days but I felt like I was in a coma from it.   It did help but the permethrin seems to do better.     Can I have a refill for the permethrin?   The permethrin works better.   It's the scabies rash again, I'm sure.   I've heard it takes months to get rid of this stuff.  When I put my bra on now I have it around my bra area and under my arms and on my arms.       Can I shave under my arms?     2. 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 3. LOCATION: "Where is the rash located?"     Not around my bra area and under my arms and on my arms. 4. COLOR: "What color is the rash?" (Note: It is difficult to assess rash color in people with darker-colored skin. When this situation occurs, simply ask the caller to describe what they see.)     Not asked since this is not a new issue. 5. ONSET: "When  did the rash begin?"     I've been dealing with this for a while 6. FEVER: "Do you have a fever?" If Yes, ask: "What is your temperature, how was it measured, and when did it start?"     Not asked 7. ITCHING: "Does the rash itch?" If Yes, ask: "How bad is the itch?" (Scale 1-10; or mild, moderate, severe)     Not asked 8. CAUSE: "What do you think is causing the rash?"     Scabies 9. NEW MEDICINES: "What new medicines are you taking?" (e.g., name of antibiotic) "When did you start taking this medication?".     I took the Ivermectin prescribed for this but it made me feel like I was in a coma.   I'm to take another round of it 14 days after completing the first round.    I don't like the way it made me feel so I'm not going to take the second round.   I would prefer the permethrin cream instead. 10. OTHER SYMPTOMS: "Do you have any other symptoms?" (e.g., sore throat, fever, joint pain)       No 11. PREGNANCY: "Is there any chance you are pregnant?" "  When was your last menstrual period?"       Not asked  Protocols used: Rash - Widespread On Drugs-A-[facility]

## 2022-12-08 NOTE — Telephone Encounter (Signed)
  Chief Complaint: Requesting a refill of the permethrin 5% cream for scabies rash Symptoms: Scabies rash is spreading.   Took first round of Ivermectin but didn't like the way it made her feel so doesn't want to take the second round.   Requesting a refill of the permethrin instead.  Ivermectin did help the rash some. Frequency: daily Pertinent Negatives: Patient denies N/A Disposition: [] ED /[] Urgent Care (no appt availability in office) / [] Appointment(In office/virtual)/ []  [place] [facility]/ [] Home Care/ [] Refused Recommended Disposition /[] [place] [facility]/ [x]  Follow-up with PCP Additional Notes: Message sent to Dr. Althea Charon regarding the Ivermectin and for refill of permethrin.

## 2022-12-08 NOTE — Addendum Note (Signed)
Addended by: Smitty Cords on: 12/08/2022 11:41 AM   Modules accepted: Orders

## 2022-12-08 NOTE — Telephone Encounter (Signed)
Will discontinue Ivermectin and re order Permethrin for 1 more topical application.  She has dermatology on 11/27. Keep that apt  Saralyn Pilar, DO [facility] 12/08/2022, 11:41 AM

## 2022-12-16 ENCOUNTER — Encounter: Payer: Self-pay | Admitting: Dermatology

## 2022-12-16 ENCOUNTER — Ambulatory Visit (INDEPENDENT_AMBULATORY_CARE_PROVIDER_SITE_OTHER): Payer: Medicaid Other | Admitting: Dermatology

## 2022-12-16 DIAGNOSIS — R21 Rash and other nonspecific skin eruption: Secondary | ICD-10-CM | POA: Diagnosis not present

## 2022-12-16 DIAGNOSIS — L209 Atopic dermatitis, unspecified: Secondary | ICD-10-CM

## 2022-12-16 DIAGNOSIS — L308 Other specified dermatitis: Secondary | ICD-10-CM | POA: Diagnosis not present

## 2022-12-16 DIAGNOSIS — J449 Chronic obstructive pulmonary disease, unspecified: Secondary | ICD-10-CM | POA: Insufficient documentation

## 2022-12-16 MED ORDER — DUPILUMAB 300 MG/2ML ~~LOC~~ SOAJ: 300.0000 mg | SUBCUTANEOUS | Status: AC

## 2022-12-16 MED ORDER — DUPILUMAB 300 MG/2ML ~~LOC~~ SOSY
600.0000 mg | PREFILLED_SYRINGE | Freq: Once | SUBCUTANEOUS | Status: AC
  Administered 2022-12-16: 600 mg via SUBCUTANEOUS

## 2022-12-16 MED ORDER — TRIAMCINOLONE ACETONIDE 0.1 % EX CREA: TOPICAL_CREAM | CUTANEOUS | 3 refills | Status: DC

## 2022-12-16 MED ORDER — DUPIXENT 300 MG/2ML ~~LOC~~ SOAJ: 300.0000 mg | SUBCUTANEOUS | 5 refills | Status: AC

## 2022-12-16 NOTE — Patient Instructions (Addendum)
Start Triamcinolone 0.1% cream twice daily until no longer itching. Avoid applying to face, groin, and axilla. Use as directed. Long-term use can cause thinning of the skin.   Topical steroids (such as triamcinolone, fluocinolone, fluocinonide, mometasone, clobetasol, halobetasol, betamethasone, hydrocortisone) can cause thinning and lightening of the skin if they are used for too long in the same area. Your physician has selected the right strength medicine for your problem and area affected on the body. Please use your medication only as directed by your physician to prevent side effects.    Dupilumab (Dupixent) is a treatment given by injection for adults and children with moderate-to-severe atopic dermatitis. Goal is control of skin condition, not cure. It is given as 2 injections at the first dose followed by 1 injection ever 2 weeks thereafter.  Young children are dosed monthly.  Potential side effects include allergic reaction, herpes infections, injection site reactions and conjunctivitis (inflammation of the eyes).  The use of Dupixent requires long term medication management, including periodic office visits.     Wound Care Instructions  Cleanse wound gently with soap and water once a day then pat dry with clean gauze. Apply a thin coat of Petrolatum (petroleum jelly, "Vaseline") over the wound (unless you have an allergy to this). We recommend that you use a new, sterile tube of Vaseline. Do not pick or remove scabs. Do not remove the yellow or white "healing tissue" from the base of the wound.  Cover the wound with fresh, clean, nonstick gauze and secure with paper tape. You may use Band-Aids in place of gauze and tape if the wound is small enough, but would recommend trimming much of the tape off as there is often too much. Sometimes Band-Aids can irritate the skin.  You should call the office for your biopsy report after 1 week if you have not already been contacted.  If you  experience any problems, such as abnormal amounts of bleeding, swelling, significant bruising, significant pain, or evidence of infection, please call the office immediately.  FOR ADULT SURGERY PATIENTS: If you need something for pain relief you may take 1 extra strength Tylenol (acetaminophen) AND 2 Ibuprofen (200mg  each) together every 4 hours as needed for pain. (do not take these if you are allergic to them or if you have a reason you should not take them.) Typically, you may only need pain medication for 1 to 3 days.   Can use Sarna or CeraVe anti itch lotions several times daily.        Due to recent changes in healthcare laws, you may see results of your pathology and/or laboratory studies on MyChart before the doctors have had a chance to review them. We understand that in some cases there may be results that are confusing or concerning to you. Please understand that not all results are received at the same time and often the doctors may need to interpret multiple results in order to provide you with the best plan of care or course of treatment. Therefore, we ask that you please give [place] 2 business days to thoroughly review all your results before contacting the office for clarification. Should we see a critical lab result, you will be contacted sooner.   If You Need Anything After Your Visit  If you have any questions or concerns for your doctor, please call our main line at [phone] and press option 4 to reach your doctor's medical assistant. If no one answers, please leave a voicemail as directed and  we will return your call as soon as possible. Messages left after 4 pm will be answered the following business day.   You may also send [place] a message via MyChart. We typically respond to MyChart messages within 1-2 business days.  For prescription refills, please ask your pharmacy to contact our office. Our fax number is [phone].  If you have an urgent issue when the clinic is  closed that cannot wait until the next business day, you can page your doctor at the number below.    Please note that while we do our best to be available for urgent issues outside of office hours, we are not available 24/7.   If you have an urgent issue and are unable to reach [place], you may choose to seek medical care at your doctor's office, retail clinic, urgent care center, or emergency room.  If you have a medical emergency, please immediately call 911 or go to the emergency department.  Pager Numbers  - Dr. Gwen Pounds: [phone]  - Dr. Roseanne Reno: [phone]  - Dr. Katrinka Blazing: [phone]   In the event of inclement weather, please call our main line at [phone] for an update on the status of any delays or closures.  Dermatology Medication Tips: Please keep the boxes that topical medications come in in order to help keep track of the instructions about where and how to use these. Pharmacies typically print the medication instructions only on the boxes and not directly on the medication tubes.   If your medication is too expensive, please contact our office at [phone] option 4 or send [place] a message through MyChart.   We are unable to tell what your co-pay for medications will be in advance as this is different depending on your insurance coverage. However, we may be able to find a substitute medication at lower cost or fill out paperwork to get insurance to cover a needed medication.   If a prior authorization is required to get your medication covered by your insurance company, please allow [place] 1-2 business days to complete this process.  Drug prices often vary depending on where the prescription is filled and some pharmacies may offer cheaper prices.  The website www.goodrx.com contains coupons for medications through different pharmacies. The prices here do not account for what the cost may be with help from insurance (it may be cheaper with your insurance), but the website can  give you the price if you did not use any insurance.  - You can print the associated coupon and take it with your prescription to the pharmacy.  - You may also stop by our office during regular business hours and pick up a GoodRx coupon card.  - If you need your prescription sent electronically to a different pharmacy, notify our office through [facility] or by phone at [phone] option 4.     Si Usted Necesita Algo Despus de Su Visita  Tambin puede enviarnos un mensaje a travs de Clinical cytogeneticist. Por lo general respondemos a los mensajes de MyChart en el transcurso de 1 a 2 das hbiles.  Para renovar recetas, por favor pida a su farmacia que se ponga en contacto con nuestra oficina. Annie Sable de fax es [place] [phone].  Si tiene un asunto urgente cuando la clnica est cerrada y que no puede esperar hasta el siguiente da hbil, puede llamar/localizar a su doctor(a) al nmero que aparece a continuacin.   Por favor, tenga en cuenta que aunque hacemos todo lo posible para estar  disponibles para asuntos urgentes fuera del horario de [place], no estamos disponibles las 24 horas del da, los 7 [address]  [address] de la [place].   Si tiene un problema urgente y no puede comunicarse con nosotros, puede optar por buscar atencin mdica  en el consultorio de su doctor(a), en una clnica privada, en un centro de atencin urgente o en una sala de emergencias.  Si tiene Engineer, drilling, por favor llame inmediatamente al 911 o vaya a la sala de emergencias.  Nmeros de bper  - Dr. Gwen Pounds: [phone]  - Dra. Roseanne Reno: [phone]  - Dr. Katrinka Blazing: [phone]   En caso de inclemencias del tiempo, por favor llame a Lacy Duverney principal al [phone] para una actualizacin sobre el [place] de cualquier retraso o cierre.  Consejos para la medicacin en dermatologa: Por favor, guarde las cajas en las que vienen los medicamentos de uso tpico para ayudarle a seguir las instrucciones sobre  dnde y cmo usarlos. Las farmacias generalmente imprimen las instrucciones del medicamento slo en las cajas y no directamente en los tubos del [place].   Si su medicamento es muy caro, por favor, pngase en contacto con Rolm Gala llamando al [phone] y presione la opcin 4 o envenos un mensaje a travs de Clinical cytogeneticist.   No podemos decirle cul ser su copago por los medicamentos por adelantado ya que esto es diferente dependiendo de la cobertura de su seguro. Sin embargo, es posible que podamos encontrar un medicamento sustituto a Audiological scientist un formulario para que el seguro cubra el medicamento que se considera necesario.   Si se requiere una autorizacin previa para que su compaa de seguros [place] su medicamento, por favor permtanos de 1 a 2 das hbiles para completar [address]  Los precios de los medicamentos varan con frecuencia dependiendo del Environmental consultant de dnde se surte la receta y alguna farmacias pueden ofrecer precios ms baratos.  El sitio web www.goodrx.com tiene cupones para medicamentos de Health and safety inspector. Los precios aqu no tienen en cuenta lo que podra costar con la ayuda del seguro (puede ser ms barato con su seguro), pero el sitio web puede darle el precio si no utiliz Tourist information centre manager.  - Puede imprimir el cupn correspondiente y llevarlo con su receta a la farmacia.  - Tambin puede pasar por nuestra oficina durante el horario de atencin regular y Education officer, museum una tarjeta de cupones de GoodRx.  - Si necesita que su receta se enve electrnicamente a una farmacia diferente, informe a nuestra oficina a travs de MyChart de [place] o por telfono llamando al [phone] y presione la opcin 4.

## 2022-12-16 NOTE — Progress Notes (Signed)
New Patient Visit   Subjective  Tanya Hardin is a 57 y.o. female who presents for the following: Rash. Back, arms and groin. Dur: 3 months. Has treated with Permethrin twice. Has been treated with topical Lotrisone, topical Triamcinolone, oral prednisone, Hydroxyzine, oral ivermectin and Fluconazole 150 mg without improvement. Has used OTC moisturizers. Itching, burning. States her "back is on fire". She states the prednisone made her feel crazy but did help with itching. She states after she took the first dose of ivermectin it caused her "brain to hurt".  Improved while taking Doxycycline as well. She thinks she may have gotten an infection from scratching the skin open.   This started after cleaning a house that was very dirty. States there were dogs and a bird in the house. Was exposed to fleas and bird droppings.   The following portions of the chart were reviewed this encounter and updated as appropriate: medications, allergies, medical history  Review of Systems:  No other skin or systemic complaints except as noted in HPI or Assessment and Plan.  Objective  Well appearing patient in no apparent distress; mood and affect are within normal limits.  A focused examination was performed of the following areas: Face, arms, torso. Patient defers examination of groin area today.   Relevant exam findings are noted in the Assessment and Plan.  Right Abdomen (side) - Lower Pink scaly excoriated plaque on right lower abdomen (biopsied) and left upper medial arm Scattered erythematous edematous urticarial plaques on trunk, especially upper back, and scattered erythematous macules on flanks and abdomen Scattered excoriated papules on shoulders and upper arms                            Assessment & Plan   Rash and other nonspecific skin eruption Right Abdomen (side) - Lower  Undiagnosed new problem with uncertain prognosis Two unique tests: KOH and skin  biopsy Prescription drug management  KOH scraping of left upper arm and right lower abdomen scaly lesions negative for fungi  Failed Lotrisone, topical Triamcinolone, oral prednisone (60 mg x 2 days then 40 x 2 days then 20 x 2 days -- caused severe agitation), Hydroxyzine, oral ivermectin and Fluconazole 150 mg  Offered biopsy and wait for result vs biopsy and empiric dupixent given exam is suggestive of eczematous/hypersensitivity reaction (and patient cannot tolerate prednisone). Patient opts for biopsy and dupixent  Start Triamcinolone 0.1% cream twice daily until no longer itching. Avoid applying to face, groin, and axilla. Use as directed. Long-term use can cause thinning of the skin.  Dupixent 300 mg x2 (600 mg) loading dose injected into B/L upper arms today.  NDC 1914-7829-56 Lot: [id number] Exp: 05/18/2024  Topical steroids (such as triamcinolone, fluocinolone, fluocinonide, mometasone, clobetasol, halobetasol, betamethasone, hydrocortisone) can cause thinning and lightening of the skin if they are used for too long in the same area. Your physician has selected the right strength medicine for your problem and area affected on the body. Please use your medication only as directed by your physician to prevent side effects.   Dupilumab (Dupixent) is a treatment given by injection for adults and children with moderate-to-severe atopic dermatitis. Goal is control of skin condition, not cure. It is given as 2 injections at the first dose followed by 1 injection ever 2 weeks thereafter.  Young children are dosed monthly.  Potential side effects include allergic reaction, herpes infections, injection site reactions and conjunctivitis (inflammation of the eyes).  The use of Dupixent requires long term medication management, including periodic office visits.  CeraVe anti-itch lotion samples given today.   Return in 2 weeks for suture removal and next Dupixent injection.   Skin / nail biopsy -  Right Abdomen (side) - Lower Type of biopsy: punch   Informed consent: discussed and consent obtained   Timeout: patient name, date of birth, surgical site, and procedure verified   Procedure prep:  Patient was prepped and draped in usual sterile fashion Prep type:  Isopropyl alcohol Anesthesia: the lesion was anesthetized in a standard fashion   Anesthetic:  1% lidocaine w/ epinephrine 1-100,000 buffered w/ 8.4% NaHCO3 Punch size:  4 mm Suture size:  4-0 Suture type: nylon   Hemostasis achieved with: suture, pressure and aluminum chloride   Outcome: patient tolerated procedure well   Post-procedure details: sterile dressing applied and wound care instructions given    Specimen 1 - Surgical pathology Differential Diagnosis: eczema vs urticaria vs scabies vs psoriasis vs pityriasis rosea   Check Margins: No  Related Medications dupilumab (DUPIXENT) prefilled syringe 600 mg   Dupilumab SOAJ 300 mg   Dupilumab (DUPIXENT) 300 MG/2ML SOAJ Inject 300 mg into the skin every 14 (fourteen) days. Starting at day 15 for maintenance.  triamcinolone cream (KENALOG) 0.1 % Apply twice daily to affected body areas until no longer itching. Avoid applying to face, groin, and axilla.  Atopic dermatitis, unspecified type  Related Medications dupilumab (DUPIXENT) prefilled syringe 600 mg   Dupilumab SOAJ 300 mg   Dupilumab (DUPIXENT) 300 MG/2ML SOAJ Inject 300 mg into the skin every 14 (fourteen) days. Starting at day 15 for maintenance.  triamcinolone cream (KENALOG) 0.1 % Apply twice daily to affected body areas until no longer itching. Avoid applying to face, groin, and axilla.     Return in about 2 weeks (around 12/30/2022) for Suture Removal, Dupixent Injection On Nurse Schedule, Rash Follow Up 4 weeks with Dr. Katrinka Blazing.  I, Lawson Radar, CMA, am acting as scribe for Elie Goody, MD.   Documentation: I have reviewed the above documentation for accuracy and completeness,  and I agree with the above.  Elie Goody, MD

## 2022-12-18 ENCOUNTER — Other Ambulatory Visit: Payer: Self-pay | Admitting: Family Medicine

## 2022-12-18 DIAGNOSIS — F411 Generalized anxiety disorder: Secondary | ICD-10-CM

## 2022-12-22 ENCOUNTER — Ambulatory Visit: Payer: Medicaid Other | Admitting: Cardiovascular Disease

## 2022-12-22 ENCOUNTER — Telehealth: Payer: Self-pay | Admitting: Cardiovascular Disease

## 2022-12-22 LAB — SURGICAL PATHOLOGY

## 2022-12-22 MED ORDER — METOPROLOL TARTRATE 25 MG PO TABS: 25.0000 mg | ORAL_TABLET | Freq: Two times a day (BID) | ORAL | 1 refills | Status: DC

## 2022-12-22 NOTE — Telephone Encounter (Signed)
Requested Prescriptions   Signed Prescriptions Disp Refills   metoprolol tartrate (LOPRESSOR) 25 MG tablet 60 tablet 1    Sig: Take 1 tablet (25 mg total) by mouth 2 (two) times daily.    Authorizing Provider: Lorine Bears A    Ordering User: Feliberto Harts L   last visit 02/20/22 with plan to follow up in 6 months next visit:  03/15/23   Patient was scheduled in 12/2022 but cancelled due to sickness.

## 2022-12-22 NOTE — Telephone Encounter (Signed)
Requested medication (s) are due for refill today: review below  Requested medication (s) are on the active medication list: yes  Last refill:  11/26/22 #90/2  Future visit scheduled: no  Notes to clinic: request for 90 DS from Pharmacy and Dx Code needed    Requested Prescriptions  Pending Prescriptions Disp Refills   busPIRone (BUSPAR) 5 MG tablet [Pharmacy Med Name: BUSPIRONE HCL 5 MG TABLET] 270 tablet 1    Sig: TAKE 1 TABLET BY MOUTH 3 TIMES DAILY AS NEEDED.     Psychiatry: Anxiolytics/Hypnotics - Non-controlled Passed - 12/18/2022  2:31 PM      Passed - Valid encounter within last 12 months    Recent Outpatient Visits           3 weeks ago Centrilobular emphysema [facility])   [place] [facility], Netta Neat, DO   1 month ago Rash   [place] [facility] Smitty Cords, DO   1 month ago GAD (generalized anxiety disorder)   [place] [facility] Smitty Cords, DO   1 year ago Bilateral carpal tunnel syndrome   [place] [facility] Smitty Cords, DO   2 years ago Chronic bilateral low back pain without sciatica   [place] [facility] [place], Netta Neat, DO       Future Appointments             Today Iran Ouch, MD [facility] [place]   In 1 month Elie Goody, MD [facility]

## 2022-12-22 NOTE — Telephone Encounter (Signed)
*  STAT* If patient is at the pharmacy, call can be transferred to refill team.   1. Which medications need to be refilled? (please list name of each medication and dose if known)   metoprolol tartrate (LOPRESSOR) 25 MG tablet    2. Which pharmacy/location (including street and city if local pharmacy) is medication to be sent to? [id number] - [place], [place] - [address]    3. Do they need a 30 day or 90 day supply? 90  day

## 2022-12-30 ENCOUNTER — Ambulatory Visit: Payer: Medicaid Other

## 2022-12-31 ENCOUNTER — Ambulatory Visit: Payer: Medicaid Other

## 2023-01-26 ENCOUNTER — Ambulatory Visit: Payer: Medicaid Other | Admitting: Dermatology

## 2023-03-05 ENCOUNTER — Ambulatory Visit: Payer: Medicaid Other | Admitting: Cardiovascular Disease

## 2023-03-20 NOTE — Progress Notes (Deleted)
 Cardiology Clinic Note   Date: 03/20/2023 ID: Tanya Hardin, DOB [birth year], MRN [id number]  Primary Cardiologist:  Lorine Bears, MD  Chief Complaint   Tanya Hardin is a 58 y.o. female who presents to the clinic today for ***  Patient Profile   Tanya Hardin is followed by Dr. Kirke Corin for the history outlined below.      Past medical history significant for: Palpitations/PSVT. Chest pain/dyspnea. Nuclear stress test 06/07/2018: Low risk study.  Small in size, mild in severity fixed apical anterior and apical defect that most likely represents artifact but cannot rule out small area of nontransmural scar.  No significant ischemia.  EF 55 to 65%.  Sensitivity and specificity of the study degraded by significant extracardiac activity, motion artifact, breast attenuation. Echo 11/19/2021: EF 60 to 65%.  No RWMA.  Normal diastolic parameters.  Normal global strain.  Normal RV size/function.  Trivial MR.  No other significant valvular abnormalities. Hyperlipidemia. Lipid panel 11/13/2022: LDL 124, HDL 42, TG 197, total 198. COPD. Tobacco abuse. GAD. MDD.  In summary, patient with history of PSVT dating back to approximately 2017.  Echo November 2017 showed normal LV function, no RWMA.  She was evaluated by EP and deferred catheter ablation.  In May 2020 she complained of worsening dyspnea and underwent stress testing which was low risk as detailed above.  She was seen in the clinic in September 2023 for follow-up and complained of left lower leg pain and bilateral foot pain.  She reported occasional chest pain associated with stress and anxiety.  Repeat echo showed normal LV/RV function as detailed above.  Lower extremity venous Doppler was negative for DVT or Baker's cyst.  Patient was last seen in the office by Ward Givens, NP on 02/20/2022.  She continued to experience DOE but was uninterested in pursuing further testing.     History of Present Illness    Today, patient  ***  Palpitations/PSVT Onset in approximately 2017.  Patient*** -Continue metoprolol.  Chest pain/dyspnea Nuclear stress test May 2020 was nonischemic.  Echo November 2023 showed normal LV/RV function, normal diastolic parameters, trivial MR.  Patient***  Hyperlipidemia LDL October 2024 124.  Patient has declined statins in the past.*** -Continue lifestyle modifications.  Tobacco abuse Patient***  ROS: All other systems reviewed and are otherwise negative except as noted in History of Present Illness.  EKGs/Labs Reviewed        11/13/2022: ALT 12; AST 11; BUN 10; Creat 0.66; Potassium 4.1; Sodium 141   11/13/2022: Hemoglobin 13.6; WBC 6.5   11/13/2022: TSH 3.29   No results found for requested labs within last 365 days.  ***  Risk Assessment/Calculations    {Does this patient have ATRIAL FIBRILLATION?:[phone]} No BP recorded.  {Refresh Note OR Click here to enter BP  :1}***        Physical Exam    VS:  LMP 06/27/2014  , BMI There is no height or weight on file to calculate BMI.  GEN: Well nourished, well developed, in no acute distress. Neck: No JVD or carotid bruits. Cardiac: *** RRR. No murmurs. No rubs or gallops.   Respiratory:  Respirations regular and unlabored. Clear to auscultation without rales, wheezing or rhonchi. GI: Soft, nontender, nondistended. Extremities: Radials/DP/PT 2+ and equal bilaterally. No clubbing or cyanosis. No edema ***  Skin: Warm and dry, no rash. Neuro: Strength intact.  Assessment & Plan   ***  Disposition: ***     {Are you ordering a CV Procedure (e.g.  stress test, cath, DCCV, TEE, etc)?   Press F2        :[id number]}   Signed, Etta Grandchild. Lalaine Overstreet, DNP, NP-C

## 2023-03-22 ENCOUNTER — Encounter: Payer: Self-pay | Admitting: *Deleted

## 2023-03-22 ENCOUNTER — Ambulatory Visit
Admission: EM | Admit: 2023-03-22 | Discharge: 2023-03-22 | Disposition: A | Discharge status: Home / Self Care | Attending: Emergency Medicine | Admitting: Emergency Medicine

## 2023-03-22 ENCOUNTER — Ambulatory Visit (INDEPENDENT_AMBULATORY_CARE_PROVIDER_SITE_OTHER)

## 2023-03-22 DIAGNOSIS — M7732 Calcaneal spur, left foot: Secondary | ICD-10-CM

## 2023-03-22 DIAGNOSIS — M79672 Pain in left foot: Secondary | ICD-10-CM

## 2023-03-22 DIAGNOSIS — M19072 Primary osteoarthritis, left ankle and foot: Secondary | ICD-10-CM | POA: Diagnosis not present

## 2023-03-22 NOTE — Discharge Instructions (Addendum)
 No apparent fracture noted, check my chart for official result/radiology read. you do have a prominent heel spur which may be causing your pain.  May take tylenol/ibuprofen as label directed for pain. Wear post op show for comfort. Rest, ice, wear supportive shoes.  Follow-up with podiatry/Orthopedist of your choice-call for appt:  [facility]: [address] [place], [place] [address] Phone: [phone] Urgent care hours 8a-7:30p Mon-Sat

## 2023-03-22 NOTE — ED Triage Notes (Addendum)
 Patient states left foot pain that is dorsal and pinky toe side.  No known injury

## 2023-03-22 NOTE — ED Provider Notes (Signed)
 [facility]    CSN: [id number] Arrival date & time: 03/22/23  1802      History   Chief Complaint Chief Complaint  Patient presents with   Foot Pain    [name] is a 58 y.o. female.   58 year old female, Tanya Hardin, presents to urgent care for evaluation of left foot pain that started today, states pain is in the left side of her left foot and radiates into her heel of her foot.  Patient denies any injury.  No meds taken.  Patient states she has to walk on her heel due to the pain.   The history is provided by the patient. No language interpreter was used.    Past Medical History:  Diagnosis Date   Anemia    h/o    Anxiety    Aortic atherosclerosis (HCC)    a. 01/2020 noted on high res chest CT.   Chest pain    a. 05/2018 MV: EF 55-65%, small, mild, fixed apical ant and apical defect, most likely artifact. No ischemia-->Low risk.   Complication of anesthesia    DURING SECTION, BP DROPPED /N/V   COPD (chronic obstructive pulmonary disease) (HCC)    Depression    Dyspnea    DUE TO COPD   Essential hypertension    GERD (gastroesophageal reflux disease)    due to gallbladder   History of echocardiogram    a. 02/2015 Echo: Ef 60-65%. no rwma; b. 11/2021 Echo: EF 60-65%, no rwma, nl RV fxn, triv MR.   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/29/2020   GAD (generalized anxiety disorder) 12/08/2019   Major  depressive disorder, recurrent, moderate (HCC) 12/08/2019   Xiphoid pain 12/08/2019   Centrilobular emphysema (HCC) 12/08/2019   Paroxysmal supraventricular tachycardia (HCC) 02/21/2015   Tobacco use 02/21/2015   Abnormal mammogram 07/31/2013   Anxiety 07/27/2013   Depression 07/27/2013   URI (upper respiratory infection) 07/27/2013   Fatigue 05/01/2013   Insomnia 05/01/2013    Past Surgical History:  Procedure Laterality Date   CESAREAN SECTION     x2   CHOLECYSTECTOMY     MOUTH SURGERY      OB History   No obstetric history on file.      Home Medications    Prior to Admission medications   Medication Sig Start Date End Date Taking? Authorizing Provider  ADVAIR DISKUS 250-50 MCG/ACT AEPB Inhale 1 puff into the lungs in the morning and at bedtime. 10/28/22   Karamalegos, Netta Neat, DO  albuterol (VENTOLIN HFA) 108 (90 Base) MCG/ACT inhaler Inhale 2 puffs into the lungs every 4 (four) hours as needed for wheezing or shortness of breath. 10/28/22   Karamalegos, Netta Neat, DO  buPROPion (WELLBUTRIN XL) 150 MG 24 hr tablet Take 1 tablet (150 mg total) by mouth 2 (two) times daily. Take 1 tablet (150 mg total) by mouth daily Patient not taking: Reported on 11/26/2022 02/20/22  Creig Hines, NP  busPIRone (BUSPAR) 5 MG tablet TAKE 1 TABLET BY MOUTH 3 TIMES DAILY AS NEEDED. 12/22/22   Althea Charon, Netta Neat, DO  clotrimazole-betamethasone (LOTRISONE) cream Apply 1-2 times a day for worsening flare dry skin dermatitis of toes/feet, may re-use daily up to 1 week as needed. Patient not taking: Reported on 11/26/2022 11/23/22   Smitty Cords, DO  doxycycline (VIBRA-TABS) 100 MG tablet Take 1 tablet (100 mg total) by mouth 2 (two) times daily. For 10 days. Take with full glass of water, stay upright 30 min after taking. 11/26/22   Karamalegos, Netta Neat, DO  Dupilumab (DUPIXENT) 300 MG/2ML SOAJ Inject 300 mg into the skin every 14 (fourteen) days. Starting at day 15  for maintenance. 12/16/22   Elie Goody, MD  fluconazole (DIFLUCAN) 150 MG tablet Take 2 tablets (300 mg total) by mouth once a week. For up to 4 weeks 11/13/22   Smitty Cords, DO  FLUoxetine (PROZAC) 10 MG capsule Take 1 capsule (10 mg total) by mouth 2 (two) times daily. 10/28/22   Karamalegos, Netta Neat, DO  hydrOXYzine (VISTARIL) 25 MG capsule Take 1 capsule (25 mg total) by mouth every 8 (eight) hours as needed. 11/13/22   Karamalegos, Netta Neat, DO  ibuprofen (ADVIL) 800 MG tablet Take 1 tablet (800 mg total) by mouth every 8 (eight) hours as needed for moderate pain. 10/28/22   Karamalegos, Netta Neat, DO  metoprolol tartrate (LOPRESSOR) 25 MG tablet Take 1 tablet (25 mg total) by mouth 2 (two) times daily. 12/22/22   Iran Ouch, MD  permethrin (ELIMITE) 5 % cream Apply cream whole body, from head to toe at bedtime; leave on for 8 to 12 hours (overnight), wash off next day. Repeat in 14 days if needed. 12/08/22   Karamalegos, Netta Neat, DO  triamcinolone cream (KENALOG) 0.1 % Apply twice daily to affected body areas until no longer itching. Avoid applying to face, groin, and axilla. 12/16/22   Elie Goody, MD    Family History Family History  Family history unknown: Yes    Social History Social History   Tobacco Use   Smoking status: Former    Average packs/day: 1.5 packs/day for 40.0 years (60.0 ttl pk-yrs)    Types: Cigarettes    Start date: 11/20/1982   Smokeless tobacco: Never   Tobacco comments:    1ppd as ot 05/24/20  //ep  Vaping Use   Vaping status: Never Used  Substance Use Topics   Alcohol use: Not Currently   Drug use: Not Currently    Comment: LAST USED WHILE IN HER 20'S     Allergies   Codeine   Review of Systems Review of Systems  Constitutional:  Negative for fever.  Musculoskeletal:  Positive for gait problem.  Skin:  Positive for color change. Negative for wound.  All other systems reviewed and are  negative.    Physical Exam Triage Vital Signs ED Triage Vitals  Encounter Vitals Group     BP 03/22/23 1845 134/84     Systolic BP Percentile --      Diastolic BP Percentile --      Pulse Rate 03/22/23 1845 65     Resp 03/22/23 1845 18     Temp 03/22/23 1845 98.7 F (37.1 C)     Temp Source 03/22/23 1845 Oral     SpO2 03/22/23 1845 96 %     Weight 03/22/23 1843 200 lb (90.7 kg)     Height 03/22/23  1843 5\' 2"  (1.575 m)     Head Circumference --      Peak Flow --      Pain Score 03/22/23 1843 10     Pain Loc --      Pain Education --      Exclude from Growth Chart --    No data found.  Updated Vital Signs BP 134/84 (BP Location: Left Arm)   Pulse 65   Temp 98.7 F (37.1 C) (Oral)   Resp 18   Ht 5\' 2"  (1.575 m)   Wt 200 lb (90.7 kg)   LMP 06/27/2014   SpO2 96%   BMI 36.58 kg/m   Visual Acuity Right Eye Distance:   Left Eye Distance:   Bilateral Distance:    Right Eye Near:   Left Eye Near:    Bilateral Near:     Physical Exam Vitals and nursing note reviewed.  Constitutional:      Appearance: She is well-developed, well-groomed and overweight.  HENT:     Head: Normocephalic.  Cardiovascular:     Pulses:          Dorsalis pedis pulses are 2+ on the left side.  Pulmonary:     Effort: Pulmonary effort is normal.  Musculoskeletal:       Feet:  Feet:     Comments: Skin intact, no wound, patient has long thick toenails. Neurological:     General: No focal deficit present.     Mental Status: She is alert and oriented to person, place, and time.     GCS: GCS eye subscore is 4. GCS verbal subscore is 5. GCS motor subscore is 6.     Cranial Nerves: No cranial nerve deficit.     Sensory: No sensory deficit.     Motor: Motor function is intact.     Coordination: Coordination is intact.  Psychiatric:        Attention and Perception: Attention normal.        Speech: Speech normal.        Behavior: Behavior is agitated.      UC Treatments / Results   Labs (all labs ordered are listed, but only abnormal results are displayed) Labs Reviewed - No data to display  EKG   Radiology No results found.  Procedures Procedures (including critical care time)  Medications Ordered in UC Medications - No data to display  Initial Impression / Assessment and Plan / UC Course  I have reviewed the triage vital signs and the nursing notes.  Pertinent labs & imaging results that were available during my care of the patient were reviewed by me and considered in my medical decision making (see chart for details).  Clinical Course as of 03/22/23 2103  Mon Mar 22, 2023  1955 Wet read by this provider negative for fracture, positive heel spur.  [JD]    Clinical Course User Index [JD] Urvi Imes, Para March, NP   Discussed exam findings and plan of care with patient, recommend supportive footwear, patient offered postop shoe/patient declined.  Patient referred to orthopedics/podiatry for nail trim further evaluation of heel pain/foot pain.  Patient given work note, strict go to ER precautions given.  Patient verbalized understanding this provider.  Ddx: Left foot pain, left heel spur, callus Final Clinical Impressions(s) / UC Diagnoses   Final diagnoses:  Left foot pain  Heel spur, left     Discharge Instructions      No apparent fracture noted, check my chart for official result/radiology  read. you do have a prominent heel spur which may be causing your pain.  May take tylenol/ibuprofen as label directed for pain. Wear post op show for comfort. Rest, ice, wear supportive shoes.  Follow-up with podiatry/Orthopedist of your choice-call for appt:  [facility]: [address] [place], [place] [address] Phone: [phone] Urgent care hours 8a-7:30p Mon-Sat     ED Prescriptions   None    PDMP not reviewed this encounter.   Clancy Gourd, NP 03/22/23 2103

## 2023-03-23 ENCOUNTER — Ambulatory Visit: Payer: Medicaid Other | Admitting: Student

## 2023-04-20 ENCOUNTER — Ambulatory Visit: Attending: Medical | Admitting: Medical

## 2023-04-20 ENCOUNTER — Encounter: Payer: Self-pay | Admitting: Medical

## 2023-04-20 VITALS — BP 98/64 | HR 56 | Ht 62.0 in | Wt 206.0 lb

## 2023-04-20 DIAGNOSIS — I10 Essential (primary) hypertension: Secondary | ICD-10-CM

## 2023-04-20 DIAGNOSIS — E782 Mixed hyperlipidemia: Secondary | ICD-10-CM | POA: Diagnosis not present

## 2023-04-20 DIAGNOSIS — I471 Supraventricular tachycardia, unspecified: Secondary | ICD-10-CM | POA: Diagnosis not present

## 2023-04-20 DIAGNOSIS — R0602 Shortness of breath: Secondary | ICD-10-CM | POA: Diagnosis not present

## 2023-04-20 MED ORDER — METOPROLOL SUCCINATE ER 25 MG PO TB24: 12.5000 mg | ORAL_TABLET | Freq: Every day | ORAL | 3 refills | Status: AC

## 2023-04-20 NOTE — Patient Instructions (Signed)
 Medication Instructions:  Your physician recommends the following medication changes.  STOP TAKING: Metoprolol tartrate   START TAKING: Metoprolol Succinate (Toprol) 12.5 mg by mouth daily  *If you need a refill on your cardiac medications before your next appointment, please call your pharmacy*  Lab Work: No labs ordered today   Testing/Procedures: No test ordered today   Follow-Up: At [facility], you and your health needs are our priority.  As part of our continuing mission to provide you with exceptional heart care, our providers are all part of one team.  This team includes your primary Cardiologist (physician) and Advanced Practice Providers or APPs (Physician Assistants and Nurse Practitioners) who all work together to provide you with the care you need, when you need it.  Your next appointment:   6 month(s)  Provider:   You may see Lorine Bears, MD or one of the following Advanced Practice Providers on your designated Care Team:   Nicolasa Ducking, NP Ames Dura, PA-C Eula Listen, PA-C Cadence Ladoga, PA-C Charlsie Quest, NP Carlos Levering, NP    We recommend signing up for the patient portal called "MyChart".  Sign up information is provided on this After Visit Summary.  MyChart is used to connect with patients for Virtual Visits (Telemedicine).  Patients are able to view lab/test results, encounter notes, upcoming appointments, etc.  Non-urgent messages can be sent to your provider as well.   To learn more about what you can do with MyChart, go to [email or web address].

## 2023-04-20 NOTE — Progress Notes (Signed)
 Cardiology Office Note:  .   Date:  04/20/2023  ID:  Tanya Hardin, DOB 1966-01-09, MRN [id number] PCP: Smitty Cords, DO  [facility] Providers Cardiologist:  Lorine Bears, MD     History of Present Illness: .   Tanya Hardin is a 58 y.o. female with a hx of pSVT, chest pain, prediabetes, anxiety and depression, and tobacco abuse who presents for follow-up.   PSVT history dates back approximately to 2017.  Prior echocardiogram in February 2017 showed normal LV function without regional wall motion abnormalities.  She has been managed with beta-blocker therapy, which she is currently taking daily, and has also been evaluated by electrophysiology.  She has not wanted ablation up to this point.  In May 2020, she was experiencing worsening dyspnea.  She underwent stress testing, which was low risk and nonischemic.  There was question of a small, mild, fixed apical anterior and apical defect which was felt to be most likely representative of artifact.  She was seen in the clinic 02/2021 reporting left lower leg pain and bilateral foot pain with occasional chest pain at the end of the day. Echo showed normal LV and RV function with trivial MR. She was subsequently seen in the ER for lower leg pain. Lower extremity venous Doppler was negative for DVT or Baker's Cysts.   The patient was last seen 02/2022 reporting DOE. She was still smoking. She declined any additional testing.   Today, blood pressure is low. EKG shows SB with a heart rate of 56bpm. She takes lopressor 25mg  once daily. She reports chronic SOB. She smokes 1 ppd. Breathing is worse when she does something. She also says she put on weight, about 10lbs. She denies palpitations or heart racing.  Studies Reviewed: Marland Kitchen   EKG Interpretation Date/Time:  Tuesday April 20 2023 09:14:30 EDT Ventricular Rate:  56 PR Interval:  140 QRS Duration:  88 QT Interval:  426 QTC Calculation: 411 R Axis:   67  Text  Interpretation: Sinus bradycardia When compared with ECG of 06-Nov-2019 19:24, No significant change was found Confirmed by Fransico Michael, Elroy Schembri ([id number]) on 04/20/2023 9:33:31 AM    Echo 11/2021  1. Left ventricular ejection fraction, by estimation, is 60 to 65%. The  left ventricle has normal function. The left ventricle has no regional  wall motion abnormalities. Left ventricular diastolic parameters were  normal. The average left ventricular  global longitudinal strain is -19.4 %. The global longitudinal strain is  normal.   2. Right ventricular systolic function is normal. The right ventricular  size is normal.   3. The mitral valve is normal in structure. Trivial mitral valve  regurgitation.   4. The aortic valve was not well visualized. Aortic valve regurgitation  is not visualized.   5. The inferior vena cava is normal in size with greater than 50%  respiratory variability, suggesting right atrial pressure of 3 mmHg.   MPI 05/2018 Narrative & Impression  Low risk, probably normal pharmacologic myocardial perfusion stress test. There is a small in size, mild in severity, fixed apical anterior and apical defect that most likely represents artifact but cannot rule out small area of non-transmural scar. There is no significant ischemia. The left ventricular ejection fraction is normal (55-65%) with normal wall motion. Sensitivity and specificity of the study are degraded by significant extracardiac activity, motion artifact, and breast attenuation.   Echo 2017 Study Conclusions   - Left ventricle: The cavity size was normal. Wall thickness was  normal. Systolic function was normal. The estimated ejection    fraction was in the range of 60% to 65%. Wall motion was normal;    there were no regional wall motion abnormalities. Left    ventricular diastolic function parameters were normal.      Physical Exam:   VS:  BP 98/64   Pulse (!) 56   Ht 5\' 2"  (1.575 m)   Wt 206 lb (93.4 kg)    LMP 06/27/2014   SpO2 97%   BMI 37.68 kg/m    Wt Readings from Last 3 Encounters:  04/20/23 206 lb (93.4 kg)  03/22/23 200 lb (90.7 kg)  11/26/22 195 lb (88.5 kg)    GEN: Well nourished, well developed in no acute distress NECK: No JVD; No carotid bruits CARDIAC: RRR, no murmurs, rubs, gallops RESPIRATORY:  Clear to auscultation without rales, wheezing or rhonchi  ABDOMEN: Soft, non-tender, non-distended EXTREMITIES:  No edema; No deformity   ASSESSMENT AND PLAN: .    Chronic shortness of breath Tobacco use Patient reports chronic and unchanged shortness of breath which is likely multifactorial due to smoking and deconditioning.  She quit for 4 months, but is back smoking 1 pack a day.  She is wanting to quit smoking.  She also feels she has gained weight from increased caloric intake. She denies any chest pain. Myoview lexiscan 05/2018 was low risk with no significant ischemia. Echo showed LVEF 60-65%, normal RVSF. Patient is not interested in doing repeat stress test at this time.  She will work on lifestyle changes and we will see her back in 6 months to assess symptoms.  pSVT She denies palpitations. I will change lopressor to Toprol 12.5 mg daily for low blood pressure and low heart rate.  Hypertension Blood pressure is low at 98/64.  Change beta-blocker to Toprol 12.5 mg as above.  Hyperlipidemia LDL 124, TG 197, total chol 198, HDL 42. We discussed adding a statin, but she would like to work-up lifestyle changes as above.      Dispo: Follow-up in 6 months  Signed, Chanteria Haggard David Stall, PA-C

## 2023-05-15 ENCOUNTER — Other Ambulatory Visit: Payer: Self-pay | Admitting: Family Medicine

## 2023-05-15 DIAGNOSIS — F411 Generalized anxiety disorder: Secondary | ICD-10-CM

## 2023-09-21 ENCOUNTER — Other Ambulatory Visit: Payer: Self-pay | Admitting: Family Medicine

## 2023-09-21 DIAGNOSIS — F411 Generalized anxiety disorder: Secondary | ICD-10-CM

## 2023-09-22 NOTE — Telephone Encounter (Signed)
 Called patient to schedule appt for medication refills. Patient reports she has 3 pills left of prozac . Appt scheduled for 10/04/23 . Earliest appt patient could schedule due to work schedule.

## 2023-09-22 NOTE — Telephone Encounter (Signed)
 Requested by interface surescripts. Courtesy refill. Last OV 11/26/22. Future visit 10/04/23.  Requested Prescriptions  Pending Prescriptions Disp Refills   FLUoxetine  (PROZAC ) 10 MG capsule [Pharmacy Med Name: FLUOXETINE  HCL 10 MG CAPSULE] 30 capsule 0    Sig: TAKE 1 CAPSULE BY MOUTH EVERY DAY     Psychiatry:  Antidepressants - SSRI Failed - 09/22/2023 11:30 AM      Failed - Valid encounter within last 6 months    Recent Outpatient Visits   None     Future Appointments             In 4 weeks Furth, Cadence H, PA-C [facility] - Completed PHQ-2 or PHQ-9 in the last 360 days

## 2023-10-04 ENCOUNTER — Ambulatory Visit: Admitting: Family Medicine

## 2023-10-12 ENCOUNTER — Ambulatory Visit: Admitting: Family Medicine

## 2023-10-12 ENCOUNTER — Telehealth: Payer: Self-pay

## 2023-10-12 ENCOUNTER — Encounter: Payer: Self-pay | Admitting: Family Medicine

## 2023-10-12 VITALS — BP 138/82 | HR 71 | Temp 97.9°F | Ht 62.0 in | Wt 209.4 lb

## 2023-10-12 DIAGNOSIS — G8929 Other chronic pain: Secondary | ICD-10-CM | POA: Diagnosis not present

## 2023-10-12 DIAGNOSIS — M545 Low back pain, unspecified: Secondary | ICD-10-CM

## 2023-10-12 DIAGNOSIS — J432 Centrilobular emphysema: Secondary | ICD-10-CM | POA: Diagnosis not present

## 2023-10-12 DIAGNOSIS — M4726 Other spondylosis with radiculopathy, lumbar region: Secondary | ICD-10-CM | POA: Diagnosis not present

## 2023-10-12 DIAGNOSIS — F411 Generalized anxiety disorder: Secondary | ICD-10-CM | POA: Diagnosis not present

## 2023-10-12 DIAGNOSIS — J410 Simple chronic bronchitis: Secondary | ICD-10-CM | POA: Diagnosis not present

## 2023-10-12 DIAGNOSIS — G5603 Carpal tunnel syndrome, bilateral upper limbs: Secondary | ICD-10-CM | POA: Diagnosis not present

## 2023-10-12 DIAGNOSIS — N3946 Mixed incontinence: Secondary | ICD-10-CM | POA: Diagnosis not present

## 2023-10-12 MED ORDER — ALBUTEROL SULFATE HFA 108 (90 BASE) MCG/ACT IN AERS: 2.0000 | INHALATION_SPRAY | RESPIRATORY_TRACT | 3 refills | Status: AC | PRN

## 2023-10-12 MED ORDER — SYMBICORT 160-4.5 MCG/ACT IN AERO: 2.0000 | INHALATION_SPRAY | Freq: Two times a day (BID) | RESPIRATORY_TRACT | 12 refills | Status: AC

## 2023-10-12 MED ORDER — FLUOXETINE HCL 10 MG PO CAPS
10.0000 mg | ORAL_CAPSULE | Freq: Every day | ORAL | 3 refills | Status: AC
Start: 2023-10-12 — End: ?

## 2023-10-12 MED ORDER — IBUPROFEN 800 MG PO TABS: 800.0000 mg | ORAL_TABLET | Freq: Three times a day (TID) | ORAL | 3 refills | Status: AC | PRN

## 2023-10-12 NOTE — Progress Notes (Signed)
 Subjective:    Patient ID: Tanya Hardin, female    DOB: 11/12/1965, 58 y.o.   MRN: [id number]  KEYONDRA Hardin is a 58 y.o. female presenting on 10/12/2023 for Medical Management of Chronic Issues   HPI  Discussed the use of AI scribe software for clinical note transcription with the patient, who gave verbal consent to proceed.  History of Present Illness   Tanya Hardin is a 58 year old female who presents for medication updates and refills.  Pruritus and dermatitis Followed by [facility] - Received Dupixent  injection from dermatologist, resulting in improved breathing - Persistent pruritus without visible rash  Impacted Cerumen Bilateral, hearing Loss - Chronic ear issue present for several months - No otalgia - Sensation of one ear being blocked - Decreased hearing, with family members noticing difficulty  COPD / Emphysema / Chronic Bronchitis - Uses albuterol  rescue inhaler AS NEEDED needs refill - Advair  previously trialed without benefit - Sample Breztri  provided good control but is not covered by insurance - Interested in trying Symbicort  as an alternative - Active smoker  Urinary incontinence, stress and urge incontinence - Unable to manage incontinence symptoms without pads / depends due to frequent accidents and leakage, interfering with daily function now and increase risk of UTI or secondary skin infection due to soiled undergarment - Requires use of approximately three pads per day - Seeking assistance to obtain pads through a government program  Insomnia - Difficulty sleeping, averaging two to three hours per night - Melatonin has been helpful in the past          10/12/2023   10:28 AM 11/26/2022   10:25 AM 11/13/2022    1:10 PM  Depression screen PHQ 2/9  Decreased Interest 1 1 1   Down, Depressed, Hopeless 1 2 1   PHQ - 2 Score 2 3 2   Altered sleeping 3 1 2   Tired, decreased energy 3 3 2   Change in appetite 2 0 0  Feeling bad or  failure about yourself  2 2 0  Trouble concentrating 2 2 0  Moving slowly or fidgety/restless 1 1 0  Suicidal thoughts 0 0 0  PHQ-9 Score 15 12 6   Difficult doing work/chores Not difficult at all Somewhat difficult Somewhat difficult       10/12/2023   10:29 AM 11/26/2022   10:27 AM 12/08/2019   10:33 AM  GAD 7 : Generalized Anxiety Score  Nervous, Anxious, on Edge 3 3 3   Control/stop worrying 3 3 3   Worry too much - different things 3 3 3   Trouble relaxing 3 3 3   Restless 3 3 3   Easily annoyed or irritable 3 3 3   Afraid - awful might happen 3 3 3   Total GAD 7 Score 21 21 21   Anxiety Difficulty Somewhat difficult  Very difficult    Social History   Tobacco Use   Smoking status: Every Day    Average packs/day: 1.5 packs/day for 40.0 years (60.0 ttl pk-yrs)    Types: Cigarettes    Start date: 11/20/1982   Smokeless tobacco: Current   Tobacco comments:    1ppd   Vaping Use   Vaping status: Never Used  Substance Use Topics   Alcohol use: Not Currently   Drug use: Not Currently    Comment: LAST USED WHILE IN HER 20'S    Review of Systems Per HPI unless specifically indicated above     Objective:    BP 138/82 (BP Location: Left Arm,  Cuff Size: Normal)   Pulse 71   Temp 97.9 F (36.6 C) (Oral)   Ht 5' 2 (1.575 m)   Wt 209 lb 6 oz (95 kg)   LMP 06/27/2014   SpO2 96%   BMI 38.30 kg/m   Wt Readings from Last 3 Encounters:  10/12/23 209 lb 6 oz (95 kg)  04/20/23 206 lb (93.4 kg)  03/22/23 200 lb (90.7 kg)    Physical Exam Vitals and nursing note reviewed.  Constitutional:      General: She is not in acute distress.    Appearance: She is well-developed. She is not diaphoretic.     Comments: Well-appearing, comfortable, cooperative  HENT:     Head: Normocephalic and atraumatic.     Right Ear: There is impacted cerumen.     Left Ear: There is impacted cerumen.  Eyes:     General:        Right eye: No discharge.        Left eye: No discharge.      Conjunctiva/sclera: Conjunctivae normal.  Neck:     Thyroid : No thyromegaly.  Cardiovascular:     Rate and Rhythm: Normal rate and regular rhythm.     Heart sounds: Normal heart sounds. No murmur heard. Pulmonary:     Effort: Pulmonary effort is normal. No respiratory distress.     Breath sounds: Wheezing present. No rales.  Musculoskeletal:        General: Normal range of motion.     Cervical back: Normal range of motion and neck supple.  Lymphadenopathy:     Cervical: No cervical adenopathy.  Skin:    General: Skin is warm and dry.     Findings: No erythema or rash.  Neurological:     Mental Status: She is alert and oriented to person, place, and time.  Psychiatric:        Behavior: Behavior normal.     Comments: Well groomed, good eye contact, normal speech and thoughts     Results for orders placed or performed in visit on 12/16/22  Surgical pathology   Collection Time: 12/16/22 12:00 AM  Result Value Ref Range   SURGICAL PATHOLOGY      SURGICAL PATHOLOGY [facility] [address] [place], [place] [address] Telephone [phone] or [phone] Fax [phone]  REPORT OF DERMATOPATHOLOGY   Accession #: [id number] Patient Name: Tanya Hardin, Tanya Hardin Visit # : [id number]  MRN: [id number] Cytotechnologist: Ephriam Rolla Edelman, Dermatopathologist, Electronic Signature DOB/Age Feb 03, 1965 (Age: 69) Gender: F Collected Date: 12/16/2022 Received Date: 12/21/2022  FINAL DIAGNOSIS       1. Skin, right abdomen (side) - lower :       CHRONIC SPONGIOTIC DERMATITIS, SEE DESCRIPTION       DATE SIGNED OUT: 12/22/2022 ELECTRONIC SIGNATURE : Depcik-Smith Md, Natalie, Dermatopathologist, Electronic Signature  MICROSCOPIC DESCRIPTION 1. There is acanthosis with foci of slight spongiosis and parakeratosis.   An infiltrate composed predominantly of lymphocytes is present around the superficial vascular plexus. A PAS stain is negative for  fungi.   The findings are most consi stent with a chronic eczematous dermatitis such as contact, nummular or atopic dermatitis.  CASE COMMENTS STAINS USED IN DIAGNOSIS: H&E Stains used in diagnosis 1 *PAS/F Stain    CLINICAL HISTORY  SPECIMEN(S) OBTAINED 1. Skin, Right Abdomen (side) - Lower  SPECIMEN COMMENTS: SPECIMEN CLINICAL INFORMATION: 1. Rash and other nonspecific skin eruption, eczema vs urticaria vs scabies vs psoriasis vs pityriasis rosea  Gross Description 1. Formalin fixed specimen received:  4 X 3 X 1 MM, TOTO (2 P) (1 B) ( AE )        Report signed out from the following location(s) [place] [address] [place], [place] [address] CLIA #: [id number]  [facility] [address] [place], [place] [address] CLIA #: [id number]       Assessment & Plan:   Problem List Items Addressed This Visit     Bilateral carpal tunnel syndrome   Relevant Medications   FLUoxetine  (PROZAC ) 10 MG capsule   ibuprofen  (ADVIL ) 800 MG tablet   Centrilobular emphysema (HCC) - Primary   Relevant Medications   albuterol  (VENTOLIN  HFA) 108 (90 Base) MCG/ACT inhaler   SYMBICORT  160-4.5 MCG/ACT inhaler   Chronic bilateral low back pain without sciatica   Relevant Medications   FLUoxetine  (PROZAC ) 10 MG capsule   ibuprofen  (ADVIL ) 800 MG tablet   COPD (chronic obstructive pulmonary disease) (HCC)   Relevant Medications   albuterol  (VENTOLIN  HFA) 108 (90 Base) MCG/ACT inhaler   SYMBICORT  160-4.5 MCG/ACT inhaler   GAD (generalized anxiety disorder)   Relevant Medications   FLUoxetine  (PROZAC ) 10 MG capsule   Mixed stress and urge urinary incontinence   Relevant Orders   For home use only DME Other see comment   Other Visit Diagnoses       Osteoarthritis of spine with radiculopathy, lumbar region       Relevant Medications   FLUoxetine  (PROZAC ) 10 MG capsule   ibuprofen  (ADVIL ) 800 MG tablet       Emphysema / COPD Simple  chronic bronchitis Chronic bronchitis with dyspnea.  Current regimen includes ineffective Advair . Breztri  effective but not covered. Symbicort  suggested after checking Medicaid PDL - Refilled albuterol  rescue inhaler. - Prescribed Symbicort , two puffs twice daily.  Atopic Dermatitis Followed by [facility] - Advised contacting dermatology for Dupixent  regarding skin itching symptoms, this was last done 11/2022, she can resume therapy  Bilateral Impacted cerumen / Hearing Loss Earwax buildup causing hearing difficulties and potential blockage. Unable to assess for infection due to obstruction. - Recommended over-the-counter Debrox ear drops. - Advised peroxide use if Debrox unavailable. - Scheduled follow-up for ear flushing if home treatment fails.  Urinary incontinence, mixed, stress and urge incontinence Urinary incontinence requiring depends and pads. Assistance needed for [facility] program application. Her symptoms are interfering with daily function and increase risk of UTI infection or complication from soiled undergarment She will need 3 pairs / changes per day quantity for 90 days was requested - Provided prescription for incontinence supplies for Medicaid application. Printed given to patient. - Advised follow-up with program for further instructions. - Consider [facility] case management team if need further assistance  Insomnia Chronic insomnia with previous effective use of melatonin. - Recommended melatonin 5-10 mg nightly as needed.  Anxiety On metoprolol  and Prozac . Plan to increase Prozac  dosage when feasible. - Continue Prozac  10 mg, plan to increase to 20 mg when able to monitor effects. - Ensure all medications sent [facility].        Orders Placed This Encounter  Procedures   For home use only DME Other see comment    N39.46 Urinary Incontinence Mixed Urge and Stress, need urinary incontinence supplies including depends / pull-up and pads, supply  quantity sufficient for use 3 pairs / changes per day, 90 day supply    Length of Need:   Lifetime    Meds  ordered this encounter  Medications   albuterol  (VENTOLIN  HFA) 108 (90 Base) MCG/ACT inhaler    Sig: Inhale 2 puffs into the lungs every 4 (four) hours as needed for wheezing or shortness of breath.    Dispense:  18 g    Refill:  3   FLUoxetine  (PROZAC ) 10 MG capsule    Sig: Take 1 capsule (10 mg total) by mouth daily.    Dispense:  90 capsule    Refill:  3   ibuprofen  (ADVIL ) 800 MG tablet    Sig: Take 1 tablet (800 mg total) by mouth every 8 (eight) hours as needed for moderate pain (pain score 4-6).    Dispense:  90 tablet    Refill:  3   SYMBICORT  160-4.5 MCG/ACT inhaler    Sig: Inhale 2 puffs into the lungs in the morning and at bedtime.    Dispense:  1 each    Refill:  12    Follow up plan: Return if symptoms worsen or fail to improve.  Marsa Officer, DO [facility] 10/12/2023, 10:59 AM

## 2023-10-12 NOTE — Telephone Encounter (Signed)
 Copied from CRM [id number]. Topic: General - Other >> Oct 12, 2023  2:38 PM Myrick T wrote: Reason for CRM: patient called stated [facility] sent a script request for incontinence supplies.

## 2023-10-12 NOTE — Patient Instructions (Addendum)
 Thank you for coming to the office today.  Return to Dermatology to discuss the Dupixent  Injection repeat.l  [facility]   [address] [place], [place] [address] Phone: [phone]  Trial on Symbicort  2 puff twice a day  Printed Rx for Incontinence Pads you can send to the company  Ear Wax Flushing here if you are not able to clear it with Debrox OTC kit also can use watered down peroxide  Try melatonin 5 to 10mg  nightly as needed  Please schedule a Follow-up Appointment to: Return if symptoms worsen or fail to improve.  If you have any other questions or concerns, please feel free to call the office or send a message through MyChart. You may also schedule an earlier appointment if necessary.  Additionally, you may be receiving a survey about your experience at our office within a few days to 1 week by e-mail or mail. We value your feedback.  Marsa Officer, DO [facility], [place]

## 2023-10-20 NOTE — Progress Notes (Unsigned)
 Cardiology Office Note   Date:  10/21/2023  ID:  AIMA MCWHIRT, DOB Sep 24, 1965, MRN [id number] PCP: Edman Marsa PARAS, DO   HeartCare Providers Cardiologist:  Deatrice Cage, MD   History of Present Illness Tanya Hardin is a 58 y.o. female with a hx of pSVT, chest pain, prediabetes, anxiety and depression, aortic atherosclerosis on CT, and tobacco abuse who presents for follow-up of shortness of breath.   PSVT history dates back approximately to 2017.  Prior echocardiogram in February 2017 showed normal LV function without regional wall motion abnormalities.  She has been managed with beta-blocker therapy, which she is currently taking daily, and has also been evaluated by electrophysiology.  She has not wanted ablation up to this point.  In May 2020, she was experiencing worsening dyspnea.  She underwent stress testing, which was low risk and nonischemic.  There was question of a small, mild, fixed apical anterior and apical defect which was felt to be most likely representative of artifact.   She was seen in the clinic 02/2021 reporting left lower leg pain and bilateral foot pain with occasional chest pain at the end of the day. Echo showed normal LV and RV function with trivial MR. She was subsequently seen in the ER for lower leg pain. Lower extremity venous Doppler was negative for DVT or Baker's Cysts.    The patient was last seen 04/20/23 and BP was low and BB was changed to Toprol . She reported SOB from smoking and deconditioning.   Today, the patient reports she has unchanged DOE.  She is still smoking, 1 ppd. No orthopnea. She denies chest pain, palpitations or lower leg edema. IT's difficult to buy healthy food due to financial concerns.   Studies Reviewed EKG Interpretation Date/Time:  Thursday October 21 2023 09:32:31 EDT Ventricular Rate:  63 PR Interval:  122 QRS Duration:  76 QT Interval:  426 QTC Calculation: 435 R Axis:   64  Text  Interpretation: Normal sinus rhythm Normal ECG When compared with ECG of 20-Apr-2023 09:14, No significant change was found Confirmed by Franchester, Hanalei Glace ([id number]) on 10/21/2023 9:35:42 AM    Echo 11/2021  1. Left ventricular ejection fraction, by estimation, is 60 to 65%. The  left ventricle has normal function. The left ventricle has no regional  wall motion abnormalities. Left ventricular diastolic parameters were  normal. The average left ventricular  global longitudinal strain is -19.4 %. The global longitudinal strain is  normal.   2. Right ventricular systolic function is normal. The right ventricular  size is normal.   3. The mitral valve is normal in structure. Trivial mitral valve  regurgitation.   4. The aortic valve was not well visualized. Aortic valve regurgitation  is not visualized.   5. The inferior vena cava is normal in size with greater than 50%  respiratory variability, suggesting right atrial pressure of 3 mmHg.    MPI 05/2018 Narrative & Impression  Low risk, probably normal pharmacologic myocardial perfusion stress test. There is a small in size, mild in severity, fixed apical anterior and apical defect that most likely represents artifact but cannot rule out small area of non-transmural scar. There is no significant ischemia. The left ventricular ejection fraction is normal (55-65%) with normal wall motion. Sensitivity and specificity of the study are degraded by significant extracardiac activity, motion artifact, and breast attenuation.    Echo 2017 Study Conclusions   - Left ventricle: The cavity size was normal. Wall thickness was  normal. Systolic function was normal. The estimated ejection    fraction was in the range of 60% to 65%. Wall motion was normal;    there were no regional wall motion abnormalities. Left    ventricular diastolic function parameters were normal.      Physical Exam VS:  BP 118/60 (BP Location: Left Arm, Patient Position: Sitting,  Cuff Size: Normal)   Pulse 63   Ht 5' 2 (1.575 m)   Wt 207 lb 12.8 oz (94.3 kg)   LMP 06/27/2014   SpO2 98%   BMI 38.01 kg/m        Wt Readings from Last 3 Encounters:  10/21/23 207 lb 12.8 oz (94.3 kg)  10/12/23 209 lb 6 oz (95 kg)  04/20/23 206 lb (93.4 kg)    GEN: Well nourished, well developed in no acute distress NECK: No JVD; No carotid bruits CARDIAC: RRR, no murmurs, rubs, gallops RESPIRATORY: Diminished breath sounds ABDOMEN: Soft, non-tender, non-distended EXTREMITIES:  No edema; No deformity   ASSESSMENT AND PLAN  Chronic SOB Tobacco use Patient reports persistent SOB on exertion. She is still smoking 1 ppd. She denies chest pain. Myoview  lexiscan  05/2018 was low risk with no significant ischemia. Echo in 2023 showed LVEF 60-65%, normal RVSF. Chest CT in 2022 showed aortic atherosclerosis. I will check a Cardiac CTA and an echo.  I will start Crestor.  Continue beta-blocker.  I recommended patient follow-up with pulmonology.  pSVT She denies palpitations.  Continue Toprol  12.5 mg daily.  HTN BP is normal. Continue Toprol  12.5mg  daily.   HLD LDL 124. TG 197, TC 198, HDL 42. She has aortic atherosclerosis. LDL goal<70. She is very reluctant to start a statin. I will start Crestor 10mg  daily.  Repeat lipid panel and LFTs in 6 to 8 weeks.        Dispo: follow-up in 3 months  Signed, Sincere Berlanga VEAR Fishman, PA-C

## 2023-10-21 ENCOUNTER — Encounter: Payer: Self-pay | Admitting: Medical

## 2023-10-21 ENCOUNTER — Ambulatory Visit: Attending: Medical | Admitting: Medical

## 2023-10-21 VITALS — BP 118/60 | HR 63 | Ht 62.0 in | Wt 207.8 lb

## 2023-10-21 DIAGNOSIS — R0602 Shortness of breath: Secondary | ICD-10-CM | POA: Diagnosis not present

## 2023-10-21 DIAGNOSIS — Z720 Tobacco use: Secondary | ICD-10-CM | POA: Insufficient documentation

## 2023-10-21 DIAGNOSIS — I10 Essential (primary) hypertension: Secondary | ICD-10-CM | POA: Insufficient documentation

## 2023-10-21 DIAGNOSIS — I471 Supraventricular tachycardia, unspecified: Secondary | ICD-10-CM | POA: Diagnosis not present

## 2023-10-21 DIAGNOSIS — E782 Mixed hyperlipidemia: Secondary | ICD-10-CM | POA: Insufficient documentation

## 2023-10-21 DIAGNOSIS — Z79899 Other long term (current) drug therapy: Secondary | ICD-10-CM | POA: Insufficient documentation

## 2023-10-21 MED ORDER — ROSUVASTATIN CALCIUM 10 MG PO TABS: 10.0000 mg | ORAL_TABLET | Freq: Every day | ORAL | 3 refills | Status: AC

## 2023-10-21 MED ORDER — METOPROLOL TARTRATE 25 MG PO TABS
25.0000 mg | ORAL_TABLET | Freq: Two times a day (BID) | ORAL | 0 refills | Status: AC
Start: 2023-10-21 — End: 2024-01-19

## 2023-10-21 NOTE — Patient Instructions (Addendum)
 Medication Instructions:  Your physician recommends the following medication changes.  START TAKING: Crestor 10 mg by mouth once daily (recommend evenings/bedtime)  Continue all other medications as prescribed. *If you need a refill on your cardiac medications before your next appointment, please call your pharmacy*  Lab Work: Today:  Your provider would like for you to have following labs drawn today BMET  Future:  Follow up starting Crestor Your provider would like for you to return in 6 -8 weeks to have the following labs drawn: Fasting Lipid Panel with LFTs.   Please go to [facility] [address] ([facility] [address] [facility]) [address], [place] [address] You do not need an appointment.  They are open from 8 am- 4:30 pm.  Lunch from 1:00 pm- 2:00 pm You FASTING need to be fasting. .   If you have labs (blood work) drawn today and your tests are completely normal, you will receive your results only by: MyChart Message (if you have MyChart) OR A paper copy in the mail If you have any lab test that is abnormal or we need to change your treatment, we will call you to review the results.  Testing/Procedures:   Your cardiac CT will be scheduled at: [facility] [address] [place], [place] [address] [phone]  Please arrive 15 mins early for check-in and test prep.  There is spacious parking Copy available) and easy access to the radiology department from the [facility] entrance. Please enter here and check-in with the desk attendant.   Please follow these instructions carefully (unless otherwise directed):  An IV will be required for this test and Nitroglycerin will be given.   On the Night Before the Test: Be sure to Drink plenty of water. Do not consume any caffeinated/decaffeinated beverages or chocolate 12 hours prior to your test. Do not take any antihistamines 12 hours prior to your test.   On the Day of the  Test: Drink plenty of water until 1 hour prior to the test. Do not eat any food 1 hour prior to test. You may take your regular medications prior to the test.  Take metoprolol  succinate two hours prior to test. FEMALES- please wear underwire-free bra if available, avoid dresses & tight clothing        After the Test: Drink plenty of water. After receiving IV contrast, you may experience a mild flushed feeling. This is normal. On occasion, you may experience a mild rash up to 24 hours after the test. This is not dangerous. If this occurs, you can take Benadryl 25 mg, Zyrtec, Claritin, or Allegra and increase your fluid intake. (Patients taking Tikosyn should avoid Benadryl, and may take Zyrtec, Claritin, or Allegra) If you experience trouble breathing, this can be serious. If it is severe call 911 IMMEDIATELY. If it is mild, please call our office.  We will call to schedule your test 2-4 weeks out understanding that some insurance companies will need an authorization prior to the service being performed.   For more information and frequently asked questions, please visit our website : [email or web address]  For non-scheduling related questions, please contact the cardiac imaging nurse navigator should you have any questions/concerns: Cardiac Imaging Nurse Navigators Direct Office Dial: [phone]   For scheduling needs, including cancellations and rescheduling, please call [place], [phone].    Echocardiogram:  Your physician has requested that you have an echocardiogram. Echocardiography is a painless test that uses sound waves to create images of your heart.  It provides your doctor with information about the size and shape of your heart and how well your heart's chambers and valves are working.   You may receive an ultrasound enhancing agent through an IV if needed to better visualize your heart during the echo. This procedure takes approximately one hour.  There are  no restrictions for this procedure.  This will take place at [address] [facility] [address], [place] [address]  Please note: We ask at that you not bring children with you during ultrasound (echo/ vascular) testing. Due to room size and safety concerns, children are not allowed in the ultrasound rooms during exams. Our front office staff cannot provide observation of children in our lobby area while testing is being conducted. An adult accompanying a patient to their appointment will only be allowed in the ultrasound room at the discretion of the ultrasound technician under special circumstances. We apologize for any inconvenience.   Follow-Up: At [facility], you and your health needs are our priority.  As part of our continuing mission to provide you with exceptional heart care, our providers are all part of one team.  This team includes your primary Cardiologist (physician) and Advanced Practice Providers or APPs (Physician Assistants and Nurse Practitioners) who all work together to provide you with the care you need, when you need it.  Your next appointment:   3 month(s)  Provider:   You may see Deatrice Cage, MD or one of the following Advanced Practice Providers on your designated Care Team:    Cadence Franchester, [place]  We recommend signing up for the patient portal called MyChart.  Sign up information is provided on this After Visit Summary.  MyChart is used to connect with patients for Virtual Visits (Telemedicine).  Patients are able to view lab/test results, encounter notes, upcoming appointments, etc.  Non-urgent messages can be sent to your provider as well.   To learn more about what you can do with MyChart, go to [email or web address].

## 2023-10-22 ENCOUNTER — Ambulatory Visit: Payer: Self-pay | Admitting: Medical

## 2023-10-22 DIAGNOSIS — J449 Chronic obstructive pulmonary disease, unspecified: Secondary | ICD-10-CM | POA: Diagnosis not present

## 2023-10-22 DIAGNOSIS — R32 Unspecified urinary incontinence: Secondary | ICD-10-CM | POA: Diagnosis not present

## 2023-10-22 LAB — BASIC METABOLIC PANEL WITH GFR
BUN/Creatinine Ratio: 10 (ref 9–23)
BUN: 8 mg/dL (ref 6–24)
CO2: 23 mmol/L (ref 20–29)
Calcium: 9.6 mg/dL (ref 8.7–10.2)
Chloride: 104 mmol/L (ref 96–106)
Creatinine, Ser: 0.82 mg/dL (ref 0.57–1.00)
Glucose: 111 mg/dL — ABNORMAL HIGH (ref 70–99)
Potassium: 5.1 mmol/L (ref 3.5–5.2)
Sodium: 140 mmol/L (ref 134–144)
eGFR: 82 mL/min/1.73 (ref >59)

## 2023-11-11 ENCOUNTER — Encounter (HOSPITAL_COMMUNITY): Payer: Self-pay

## 2023-11-12 ENCOUNTER — Telehealth (HOSPITAL_COMMUNITY): Payer: Self-pay | Admitting: Emergency Medicine

## 2023-11-12 NOTE — Telephone Encounter (Signed)
 Unable to leave vm Rockwell Alexandria RN Navigator Cardiac Imaging [facility] Services [phone] Office  [phone] Cell

## 2023-11-15 ENCOUNTER — Ambulatory Visit
Admission: RE | Admit: 2023-11-15 | Discharge: 2023-11-15 | Disposition: A | Discharge status: Home / Self Care | Source: Ambulatory Visit | Attending: Medical | Admitting: Medical

## 2023-11-15 DIAGNOSIS — R0602 Shortness of breath: Secondary | ICD-10-CM | POA: Diagnosis present

## 2023-11-15 MED ORDER — NITROGLYCERIN 0.4 MG SL SUBL
0.8000 mg | SUBLINGUAL_TABLET | Freq: Once | SUBLINGUAL | Status: AC
  Administered 2023-11-15: 0.8 mg via SUBLINGUAL
  Filled 2023-11-15: qty 25

## 2023-11-15 MED ORDER — IOHEXOL 350 MG/ML SOLN
100.0000 mL | Freq: Once | INTRAVENOUS | Status: AC | PRN
  Administered 2023-11-15: 100 mL via INTRAVENOUS

## 2023-11-20 DIAGNOSIS — R32 Unspecified urinary incontinence: Secondary | ICD-10-CM | POA: Diagnosis not present

## 2023-11-20 DIAGNOSIS — J449 Chronic obstructive pulmonary disease, unspecified: Secondary | ICD-10-CM | POA: Diagnosis not present

## 2023-12-08 ENCOUNTER — Ambulatory Visit
Admission: EM | Admit: 2023-12-08 | Discharge: 2023-12-08 | Disposition: A | Discharge status: Home / Self Care | Attending: Emergency Medicine | Admitting: Emergency Medicine

## 2023-12-08 DIAGNOSIS — H66001 Acute suppurative otitis media without spontaneous rupture of ear drum, right ear: Secondary | ICD-10-CM

## 2023-12-08 DIAGNOSIS — H6121 Impacted cerumen, right ear: Secondary | ICD-10-CM | POA: Diagnosis not present

## 2023-12-08 DIAGNOSIS — J069 Acute upper respiratory infection, unspecified: Secondary | ICD-10-CM | POA: Diagnosis not present

## 2023-12-08 LAB — POCT INFLUENZA A/B
Influenza A, POC: NEGATIVE
Influenza B, POC: NEGATIVE

## 2023-12-08 LAB — POCT RAPID STREP A (OFFICE): Rapid Strep A Screen: NEGATIVE

## 2023-12-08 LAB — POC SOFIA SARS ANTIGEN FIA: SARS Coronavirus 2 Ag: NEGATIVE

## 2023-12-08 MED ORDER — PROMETHAZINE-DM 6.25-15 MG/5ML PO SYRP: 5.0000 mL | ORAL_SOLUTION | Freq: Four times a day (QID) | ORAL | 0 refills | Status: AC | PRN

## 2023-12-08 MED ORDER — IPRATROPIUM BROMIDE 0.06 % NA SOLN: 2.0000 | Freq: Four times a day (QID) | NASAL | 12 refills | Status: AC

## 2023-12-08 MED ORDER — AMOXICILLIN-POT CLAVULANATE 875-125 MG PO TABS: 1.0000 | ORAL_TABLET | Freq: Two times a day (BID) | ORAL | 0 refills | Status: AC

## 2023-12-08 MED ORDER — BENZONATATE 100 MG PO CAPS: 200.0000 mg | ORAL_CAPSULE | Freq: Three times a day (TID) | ORAL | 0 refills | Status: AC

## 2023-12-08 NOTE — ED Triage Notes (Signed)
 Sx started today  Sore throat Right ear pain Nasal congestion Fever Cough

## 2023-12-08 NOTE — Discharge Instructions (Addendum)
 Take the Augmentin  twice daily for 7 days with food for treatment of your ear infection and URI.  Take an over-the-counter probiotic 1 hour after each dose of antibiotic to prevent diarrhea.  Use over-the-counter Tylenol and ibuprofen as needed for pain or fever.  Place a hot water bottle, or heating pad, underneath your pillowcase at night to help dilate up your ear and aid in pain relief as well as resolution of the infection.  Use the Atrovent  nasal spray, 2 squirts in each nostril every 6 hours, as needed for runny nose and postnasal drip.  Use the Tessalon  Perles every 8 hours during the day.  Take them with a small sip of water.  They may give you some numbness to the base of your tongue or a metallic taste in your mouth, this is normal.  Use the Promethazine  DM cough syrup at bedtime for cough and congestion.  It will make you drowsy so do not take it during the day.  Return for reevaluation or see your primary care provider for any new or worsening symptoms.

## 2023-12-08 NOTE — ED Provider Notes (Addendum)
 [facility]    CSN: [id number] Arrival date & time: 12/08/23  1703      History   Chief Complaint Chief Complaint  Patient presents with   Cough   Sore Throat   Otalgia    [name] is a 58 y.o. female.   HPI  58 year old female with past medical history significant for COPD, generalized anxiety disorder, major depressive disorder, PSVT, centrilobular emphysema, GERD, and hyperlipidemia presents for evaluation of respiratory symptoms that started today and include fever with a Tmax of 99, nasal congestion and productive cough for green sputum, sore throat, and right ear pain that developed in the last several hours.  She denies shortness of breath or wheezing.  Past Medical History:  Diagnosis Date   Anemia    h/o    Anxiety    Aortic atherosclerosis    a. 01/2020 noted on high res chest CT.   Chest pain    a. 05/2018 MV: EF 55-65%, small, mild, fixed apical ant and apical defect, most likely artifact. No ischemia-->Low risk.   Complication of anesthesia    DURING SECTION, BP DROPPED /N/V   COPD (chronic obstructive pulmonary disease) (HCC)    Depression    Dyspnea    DUE TO COPD   Essential hypertension    GERD (gastroesophageal reflux disease)    due to gallbladder   History of echocardiogram    a. 02/2015 Echo: Ef 60-65%. no rwma; b. 11/2021 Echo: EF 60-65%, no rwma, nl RV fxn, triv MR.   Hyperlipidemia    PONV (postoperative nausea and vomiting)    X1 ONLY DURING C SECTION   PSVT (paroxysmal supraventricular tachycardia)    Tobacco abuse     Patient Active Problem List   Diagnosis Date Noted   Mixed stress and urge urinary incontinence 10/12/2023   Left foot pain 03/22/2023   Heel spur, left 03/22/2023   COPD (chronic obstructive pulmonary disease) (HCC) 12/16/2022   Mixed hyperlipidemia 10/28/2022   Morbid obesity (HCC) 10/28/2022   Bilateral carpal tunnel syndrome 11/21/2021   Chronic bilateral low back pain without sciatica  03/29/2020   Spondylosis of lumbar region without myelopathy or radiculopathy 03/29/2020   GAD (generalized anxiety disorder) 12/08/2019   Major depressive disorder, recurrent, moderate (HCC) 12/08/2019   Xiphoid pain 12/08/2019   Centrilobular emphysema (HCC) 12/08/2019   Paroxysmal supraventricular tachycardia 02/21/2015   Tobacco use 02/21/2015   Abnormal mammogram 07/31/2013   Anxiety 07/27/2013   Depression 07/27/2013   URI (upper respiratory infection) 07/27/2013   Fatigue 05/01/2013   Insomnia 05/01/2013    Past Surgical History:  Procedure Laterality Date   CESAREAN SECTION     x2   CHOLECYSTECTOMY     MOUTH SURGERY      OB History   No obstetric history on file.      Home Medications    Prior to Admission medications   Medication Sig Start Date End Date Taking? Authorizing Provider  amoxicillin -clavulanate (AUGMENTIN ) 875-125 MG tablet Take 1 tablet by mouth every 12 (twelve) hours for 7 days. 12/08/23 12/15/23 Yes Bernardino Ditch, NP  benzonatate (TESSALON) 100 MG capsule Take 2 capsules (200 mg total) by mouth every 8 (eight) hours. 12/08/23  Yes Bernardino Ditch, NP  FLUoxetine  (PROZAC ) 10 MG capsule Take 1 capsule (10 mg total) by mouth daily. 10/12/23  Yes Karamalegos, Alexander J, DO  ipratropium (ATROVENT) 0.06 % nasal spray Place 2 sprays into both nostrils 4 (four) times daily. 12/08/23  Yes Ensley Blas,  Venetia, NP  metoprolol  succinate (TOPROL -XL) 25 MG 24 hr tablet Take 0.5 tablets (12.5 mg total) by mouth daily. Take with or immediately following a meal. 04/20/23  Yes Furth, Cadence H, PA-C  metoprolol  tartrate (LOPRESSOR ) 25 MG tablet Take 1 tablet (25 mg total) by mouth 2 (two) times daily. TAKE 2 HOURS PRIOR TO CORONARY CT SCAN 10/21/23 01/19/24 Yes Furth, Cadence H, PA-C  promethazine -dextromethorphan (PROMETHAZINE -DM) 6.25-15 MG/5ML syrup Take 5 mLs by mouth 4 (four) times daily as needed. 12/08/23  Yes Bernardino Venetia, NP  rosuvastatin (CRESTOR) 10 MG tablet Take 1  tablet (10 mg total) by mouth daily. 10/21/23 01/19/24 Yes Furth, Cadence H, PA-C  albuterol  (VENTOLIN  HFA) 108 (90 Base) MCG/ACT inhaler Inhale 2 puffs into the lungs every 4 (four) hours as needed for wheezing or shortness of breath. 10/12/23   Karamalegos, Marsa PARAS, DO  Dupilumab  (DUPIXENT ) 300 MG/2ML SOAJ Inject 300 mg into the skin every 14 (fourteen) days. Starting at day 15 for maintenance. Patient not taking: Reported on 10/21/2023 12/16/22   Claudene Lehmann, MD  ibuprofen  (ADVIL ) 800 MG tablet Take 1 tablet (800 mg total) by mouth every 8 (eight) hours as needed for moderate pain (pain score 4-6). 10/12/23   Edman Marsa PARAS, DO  SYMBICORT  160-4.5 MCG/ACT inhaler Inhale 2 puffs into the lungs in the morning and at bedtime. 10/12/23   Karamalegos, Marsa PARAS, DO    Family History Family History  Family history unknown: Yes    Social History Social History   Tobacco Use   Smoking status: Every Day    Average packs/day: 1.5 packs/day for 40.0 years (60.0 ttl pk-yrs)    Types: Cigarettes    Start date: 11/20/1982   Smokeless tobacco: Current   Tobacco comments:    1ppd   Vaping Use   Vaping status: Never Used  Substance Use Topics   Alcohol use: Not Currently   Drug use: Not Currently    Comment: LAST USED WHILE IN HER 20'S     Allergies   Codeine   Review of Systems Review of Systems  Constitutional:  Positive for fever.  HENT:  Positive for congestion, ear pain, rhinorrhea and sore throat. Negative for ear discharge.   Respiratory:  Positive for cough. Negative for shortness of breath and wheezing.      Physical Exam Triage Vital Signs ED Triage Vitals  Encounter Vitals Group     BP      Girls Systolic BP Percentile      Girls Diastolic BP Percentile      Boys Systolic BP Percentile      Boys Diastolic BP Percentile      Pulse      Resp      Temp      Temp src      SpO2      Weight      Height      Head Circumference      Peak Flow       Pain Score      Pain Loc      Pain Education      Exclude from Growth Chart    No data found.  Updated Vital Signs BP 120/77 (BP Location: Right Arm)   Pulse 77   Temp 99.1 F (37.3 C) (Oral)   Resp 18   Wt 205 lb (93 kg)   LMP 06/27/2014   SpO2 95%   BMI 37.49 kg/m   Visual Acuity Right Eye Distance:  Left Eye Distance:   Bilateral Distance:    Right Eye Near:   Left Eye Near:    Bilateral Near:     Physical Exam Vitals and nursing note reviewed.  Constitutional:      Appearance: Normal appearance. She is not ill-appearing.  HENT:     Head: Normocephalic and atraumatic.     Right Ear: External ear normal. There is impacted cerumen.     Left Ear: Tympanic membrane, ear canal and external ear normal. There is no impacted cerumen.     Ears:     Comments: Right external auditory canal is completely occluded by dark cerumen.  Left EAC is clear and the left tympanic membrane is pearly gray in appearance.    Nose: Congestion and rhinorrhea present.     Comments: These mucosa is edematous and erythematous with clear discharge in both nares.    Mouth/Throat:     Mouth: Mucous membranes are moist.     Pharynx: Oropharynx is clear. Posterior oropharyngeal erythema present. No oropharyngeal exudate.     Comments: Erythema to the posterior pharynx with clear postnasal drip. Cardiovascular:     Rate and Rhythm: Normal rate and regular rhythm.     Pulses: Normal pulses.     Heart sounds: Normal heart sounds. No murmur heard.    No friction rub. No gallop.  Pulmonary:     Effort: Pulmonary effort is normal.     Breath sounds: Normal breath sounds. No wheezing, rhonchi or rales.  Musculoskeletal:     Cervical back: Normal range of motion and neck supple. No tenderness.  Lymphadenopathy:     Cervical: No cervical adenopathy.  Skin:    General: Skin is warm and dry.     Capillary Refill: Capillary refill takes less than 2 seconds.     Findings: No rash.  Neurological:      General: No focal deficit present.     Mental Status: She is alert and oriented to person, place, and time.      UC Treatments / Results  Labs (all labs ordered are listed, but only abnormal results are displayed) Labs Reviewed  POCT INFLUENZA A/B - Normal  POC SOFIA SARS ANTIGEN FIA - Normal  POCT RAPID STREP A (OFFICE) - Normal  CULTURE, GROUP A STREP [facility])    EKG   Radiology No results found.  Procedures Procedures (including critical care time)  Medications Ordered in UC Medications - No data to display  Initial Impression / Assessment and Plan / UC Course  I have reviewed the triage vital signs and the nursing notes.  Pertinent labs & imaging results that were available during my care of the patient were reviewed by me and considered in my medical decision making (see chart for details).   Patient is a pleasant, nontoxic-appearing 58 year old female presenting for evaluation of respiratory symptoms as outlined in HPI above.  Currently, her most significant symptom is pain in her right ear.  She reports that the pain developed in the last several hours.  On exam she has cerumen occluding the right EAC.  The left EAC is clear and the left tympanic membrane is pearly gray in appearance.  The patient is able to speak in full sentences without dyspnea or tachypnea.  Lung sounds are clear to auscultation in all fields.  I will order a lavage of the right ear and reassess.  At the differential diagnose include COVID, influenza, strep pharyngitis, viral respiratory illness.  I will order a rapid  strep, COVID antigen test, and influenza antigen test.  Rapid strep is negative.  I will send swab for culture.  COVID antigen test is negative.  Influenza antigen test is negative.  Following ear lavage the patient reports that her pain is slightly improved but is still painful.  I reinspected the ear and revealed a clear external auditory canal.  The eardrum is erythematous and  injected.  I will discharge patient on the diagnosis of URI with cough and congestion and right-sided otitis media.  I will start her on Augmentin  875 twice daily with food for 7 days for treatment of both along with Atrovent nasal spray for nasal congestion and Tessalon Perles and Promethazine  DM cough syrup for cough and congestion.   Final Clinical Impressions(s) / UC Diagnoses   Final diagnoses:  URI with cough and congestion  Non-recurrent acute suppurative otitis media of right ear without spontaneous rupture of tympanic membrane  Impacted cerumen of right ear     Discharge Instructions      Take the Augmentin  twice daily for 7 days with food for treatment of your ear infection and URI.  Take an over-the-counter probiotic 1 hour after each dose of antibiotic to prevent diarrhea.  Use over-the-counter Tylenol  and ibuprofen  as needed for pain or fever.  Place a hot water bottle, or heating pad, underneath your pillowcase at night to help dilate up your ear and aid in pain relief as well as resolution of the infection.  Use the Atrovent nasal spray, 2 squirts in each nostril every 6 hours, as needed for runny nose and postnasal drip.  Use the Tessalon Perles every 8 hours during the day.  Take them with a small sip of water.  They may give you some numbness to the base of your tongue or a metallic taste in your mouth, this is normal.  Use the Promethazine  DM cough syrup at bedtime for cough and congestion.  It will make you drowsy so do not take it during the day.  Return for reevaluation or see your primary care provider for any new or worsening symptoms.      ED Prescriptions     Medication Sig Dispense Auth. Provider   amoxicillin -clavulanate (AUGMENTIN ) 875-125 MG tablet Take 1 tablet by mouth every 12 (twelve) hours for 7 days. 14 tablet Bernardino Ditch, NP   benzonatate (TESSALON) 100 MG capsule Take 2 capsules (200 mg total) by mouth every 8 (eight) hours. 21 capsule  Bernardino Ditch, NP   ipratropium (ATROVENT) 0.06 % nasal spray Place 2 sprays into both nostrils 4 (four) times daily. 15 mL Bernardino Ditch, NP   promethazine -dextromethorphan (PROMETHAZINE -DM) 6.25-15 MG/5ML syrup Take 5 mLs by mouth 4 (four) times daily as needed. 118 mL Bernardino Ditch, NP      PDMP not reviewed this encounter.   Bernardino Ditch, NP 12/08/23 1819    Bernardino Ditch, NP 12/08/23 8180

## 2023-12-14 ENCOUNTER — Ambulatory Visit: Attending: Medical

## 2023-12-14 ENCOUNTER — Other Ambulatory Visit: Payer: Self-pay | Admitting: Emergency Medicine

## 2023-12-14 DIAGNOSIS — Z79899 Other long term (current) drug therapy: Secondary | ICD-10-CM

## 2023-12-14 DIAGNOSIS — R0602 Shortness of breath: Secondary | ICD-10-CM | POA: Insufficient documentation

## 2023-12-14 DIAGNOSIS — E782 Mixed hyperlipidemia: Secondary | ICD-10-CM

## 2023-12-14 LAB — ECHOCARDIOGRAM COMPLETE
AR max vel: 1.78 cm2
AV Area VTI: 1.66 cm2
AV Area mean vel: 1.7 cm2
AV Mean grad: 6 mmHg
AV Peak grad: 10 mmHg
Ao pk vel: 1.58 m/s
Area-P 1/2: 4.21 cm2
S' Lateral: 2.88 cm

## 2023-12-15 DIAGNOSIS — Z79899 Other long term (current) drug therapy: Secondary | ICD-10-CM | POA: Diagnosis not present

## 2023-12-16 LAB — LIPID PANEL
Chol/HDL Ratio: 2.8 ratio (ref 0.0–4.4)
Cholesterol, Total: 135 mg/dL (ref 100–199)
HDL: 48 mg/dL (ref >39)
LDL Chol Calc (NIH): 68 mg/dL (ref 0–99)
Triglycerides: 100 mg/dL (ref 0–149)
VLDL Cholesterol Cal: 19 mg/dL (ref 5–40)

## 2023-12-16 LAB — HEPATIC FUNCTION PANEL
ALT: 19 IU/L (ref 0–32)
AST: 17 IU/L (ref 0–40)
Albumin: 4.1 g/dL (ref 3.8–4.9)
Alkaline Phosphatase: 77 IU/L (ref 49–135)
Bilirubin Total: 0.4 mg/dL (ref 0.0–1.2)
Bilirubin, Direct: 0.11 mg/dL (ref 0.00–0.40)
Total Protein: 7.6 g/dL (ref 6.0–8.5)

## 2023-12-20 DIAGNOSIS — J449 Chronic obstructive pulmonary disease, unspecified: Secondary | ICD-10-CM | POA: Diagnosis not present

## 2023-12-20 DIAGNOSIS — R32 Unspecified urinary incontinence: Secondary | ICD-10-CM | POA: Diagnosis not present

## 2024-01-17 NOTE — Progress Notes (Deleted)
" °  Cardiology Office Note   Date:  01/17/2024  ID:  Tanya Hardin, DOB 02/19/1965, MRN [id number] PCP: Edman Marsa PARAS, DO  [facility] Providers Cardiologist:  Deatrice Cage, MD { Click to update primary MD,subspecialty MD or APP then REFRESH:1}    History of Present Illness Tanya Hardin is a 58 y.o. female with a hx of pSVT, chest pain, prediabetes, anxiety and depression, aortic atherosclerosis on CT, and tobacco abuse who presents for follow-up of shortness of breath.   PSVT history dates back approximately to 2017.  Prior echocardiogram in February 2017 showed normal LV function without regional wall motion abnormalities.  She has been managed with beta-blocker therapy, which she is currently taking daily, and has also been evaluated by electrophysiology.  She has not wanted ablation up to this point.  In May 2020, she was experiencing worsening dyspnea.  She underwent stress testing, which was low risk and nonischemic.  There was question of a small, mild, fixed apical anterior and apical defect which was felt to be most likely representative of artifact.   She was seen in the clinic 02/2021 reporting left lower leg pain and bilateral foot pain with occasional chest pain at the end of the day. Echo showed normal LV and RV function with trivial MR. She was subsequently seen in the ER for lower leg pain. Lower extremity venous Doppler was negative for DVT or Baker's Cysts.   The patient was last seen 10/21/23 reporting persistent DOE. She was still smoking. Cardiac CTA showed coronary calcium  score of 0, no CAD.   ROS: ***  Studies Reviewed      *** Risk Assessment/Calculations {Does this patient have ATRIAL FIBRILLATION?[phone]} No BP recorded.  {Refresh Note OR Click here to enter BP  :1}***       Physical Exam VS:  LMP 06/27/2014        Wt Readings from Last 3 Encounters:  12/08/23 205 lb (93 kg)  10/21/23 207 lb 12.8 oz (94.3 kg)  10/12/23 209 lb 6  oz (95 kg)    GEN: Well nourished, well developed in no acute distress NECK: No JVD; No carotid bruits CARDIAC: ***RRR, no murmurs, rubs, gallops RESPIRATORY:  Clear to auscultation without rales, wheezing or rhonchi  ABDOMEN: Soft, non-tender, non-distended EXTREMITIES:  No edema; No deformity   ASSESSMENT AND PLAN ***    {Are you ordering a CV Procedure (e.g. stress test, cath, DCCV, TEE, etc)?   Press F2        :[id number]}  Dispo: ***  Signed, Inioluwa Boulay VEAR Fishman, PA-C   "

## 2024-01-18 ENCOUNTER — Ambulatory Visit: Attending: Medical | Admitting: Medical
